# Patient Record
Sex: Female | Born: 1965 | Race: Black or African American | Hispanic: No | Smoking: Never smoker
Health system: Southern US, Community
[De-identification: ages and names within clinical notes are randomized; demographics above are authoritative.]

## PROBLEM LIST (undated history)

## (undated) ENCOUNTER — Ambulatory Visit (HOSPITAL_COMMUNITY): Source: Home / Self Care

## (undated) DIAGNOSIS — Z800 Family history of malignant neoplasm of digestive organs: Secondary | ICD-10-CM

## (undated) DIAGNOSIS — H04559 Acquired stenosis of unspecified nasolacrimal duct: Secondary | ICD-10-CM

## (undated) DIAGNOSIS — C50919 Malignant neoplasm of unspecified site of unspecified female breast: Secondary | ICD-10-CM

## (undated) DIAGNOSIS — Z1371 Encounter for nonprocreative screening for genetic disease carrier status: Secondary | ICD-10-CM

## (undated) DIAGNOSIS — Z1509 Genetic susceptibility to other malignant neoplasm: Secondary | ICD-10-CM

## (undated) DIAGNOSIS — Z8042 Family history of malignant neoplasm of prostate: Secondary | ICD-10-CM

## (undated) DIAGNOSIS — Z9889 Other specified postprocedural states: Secondary | ICD-10-CM

## (undated) DIAGNOSIS — D649 Anemia, unspecified: Secondary | ICD-10-CM

## (undated) DIAGNOSIS — I10 Essential (primary) hypertension: Secondary | ICD-10-CM

## (undated) DIAGNOSIS — I890 Lymphedema, not elsewhere classified: Secondary | ICD-10-CM

## (undated) DIAGNOSIS — G43909 Migraine, unspecified, not intractable, without status migrainosus: Secondary | ICD-10-CM

## (undated) DIAGNOSIS — Z1501 Genetic susceptibility to malignant neoplasm of breast: Secondary | ICD-10-CM

## (undated) HISTORY — DX: Other specified postprocedural states: Z98.890

## (undated) HISTORY — DX: Essential (primary) hypertension: I10

## (undated) HISTORY — DX: Migraine, unspecified, not intractable, without status migrainosus: G43.909

## (undated) HISTORY — DX: Family history of malignant neoplasm of digestive organs: Z80.0

## (undated) HISTORY — DX: Malignant neoplasm of unspecified site of unspecified female breast: C50.919

## (undated) HISTORY — DX: Lymphedema, not elsewhere classified: I89.0

## (undated) HISTORY — DX: Genetic susceptibility to malignant neoplasm of breast: Z15.01

## (undated) HISTORY — DX: Family history of malignant neoplasm of prostate: Z80.42

## (undated) HISTORY — DX: Acquired stenosis of unspecified nasolacrimal duct: H04.559

## (undated) HISTORY — PX: TUBAL LIGATION: SHX77

## (undated) HISTORY — DX: Encounter for nonprocreative screening for genetic disease carrier status: Z13.71

## (undated) HISTORY — PX: OTHER SURGICAL HISTORY: SHX169

## (undated) HISTORY — PX: BREAST BIOPSY: SHX20

## (undated) HISTORY — DX: Genetic susceptibility to other malignant neoplasm: Z15.09

---

## 2000-05-06 ENCOUNTER — Emergency Department (HOSPITAL_COMMUNITY): Admission: EM | Admit: 2000-05-06 | Discharge: 2000-05-06 | Payer: Self-pay | Admitting: *Deleted

## 2000-06-05 ENCOUNTER — Encounter: Payer: Self-pay | Admitting: Unknown Physician Specialty

## 2000-06-05 ENCOUNTER — Ambulatory Visit (HOSPITAL_COMMUNITY): Admission: RE | Admit: 2000-06-05 | Discharge: 2000-06-05 | Payer: Self-pay | Admitting: Unknown Physician Specialty

## 2000-07-04 ENCOUNTER — Ambulatory Visit (HOSPITAL_COMMUNITY): Admission: RE | Admit: 2000-07-04 | Discharge: 2000-07-04 | Payer: Self-pay | Admitting: Urology

## 2000-07-04 ENCOUNTER — Encounter: Payer: Self-pay | Admitting: Urology

## 2000-07-05 ENCOUNTER — Encounter: Payer: Self-pay | Admitting: Urology

## 2000-07-05 ENCOUNTER — Ambulatory Visit (HOSPITAL_COMMUNITY): Admission: RE | Admit: 2000-07-05 | Discharge: 2000-07-05 | Payer: Self-pay | Admitting: Urology

## 2000-07-18 ENCOUNTER — Other Ambulatory Visit: Admission: RE | Admit: 2000-07-18 | Discharge: 2000-07-18 | Payer: Self-pay | Admitting: Obstetrics

## 2000-11-05 HISTORY — PX: BREAST LUMPECTOMY: SHX2

## 2001-07-06 DIAGNOSIS — Z853 Personal history of malignant neoplasm of breast: Secondary | ICD-10-CM | POA: Insufficient documentation

## 2001-07-06 DIAGNOSIS — C50919 Malignant neoplasm of unspecified site of unspecified female breast: Secondary | ICD-10-CM

## 2001-07-06 HISTORY — DX: Malignant neoplasm of unspecified site of unspecified female breast: C50.919

## 2001-07-17 ENCOUNTER — Encounter: Admission: RE | Admit: 2001-07-17 | Discharge: 2001-07-17 | Payer: Self-pay | Admitting: Unknown Physician Specialty

## 2001-07-17 ENCOUNTER — Encounter: Payer: Self-pay | Admitting: Unknown Physician Specialty

## 2001-07-23 ENCOUNTER — Ambulatory Visit (HOSPITAL_BASED_OUTPATIENT_CLINIC_OR_DEPARTMENT_OTHER): Admission: RE | Admit: 2001-07-23 | Discharge: 2001-07-23 | Payer: Self-pay | Admitting: Surgery

## 2001-07-23 ENCOUNTER — Encounter (INDEPENDENT_AMBULATORY_CARE_PROVIDER_SITE_OTHER): Payer: Self-pay | Admitting: *Deleted

## 2001-07-23 ENCOUNTER — Encounter: Payer: Self-pay | Admitting: Surgery

## 2001-07-23 HISTORY — PX: MASTECTOMY, PARTIAL: SHX709

## 2001-07-29 ENCOUNTER — Encounter: Payer: Self-pay | Admitting: Surgery

## 2001-07-29 ENCOUNTER — Encounter: Admission: RE | Admit: 2001-07-29 | Discharge: 2001-07-29 | Payer: Self-pay | Admitting: Surgery

## 2001-07-31 ENCOUNTER — Encounter: Admission: RE | Admit: 2001-07-31 | Discharge: 2001-07-31 | Payer: Self-pay | Admitting: Surgery

## 2001-07-31 ENCOUNTER — Encounter: Payer: Self-pay | Admitting: Surgery

## 2001-11-03 ENCOUNTER — Ambulatory Visit (HOSPITAL_COMMUNITY): Admission: RE | Admit: 2001-11-03 | Discharge: 2001-11-03 | Payer: Self-pay | Admitting: Hematology and Oncology

## 2001-11-03 ENCOUNTER — Encounter: Payer: Self-pay | Admitting: Hematology and Oncology

## 2001-11-12 ENCOUNTER — Encounter: Payer: Self-pay | Admitting: Surgery

## 2001-11-12 ENCOUNTER — Ambulatory Visit (HOSPITAL_BASED_OUTPATIENT_CLINIC_OR_DEPARTMENT_OTHER): Admission: RE | Admit: 2001-11-12 | Discharge: 2001-11-12 | Payer: Self-pay | Admitting: Surgery

## 2001-11-13 ENCOUNTER — Ambulatory Visit: Admission: RE | Admit: 2001-11-13 | Discharge: 2002-02-11 | Payer: Self-pay | Admitting: Radiation Oncology

## 2002-01-10 ENCOUNTER — Emergency Department (HOSPITAL_COMMUNITY): Admission: EM | Admit: 2002-01-10 | Discharge: 2002-01-11 | Payer: Self-pay | Admitting: Emergency Medicine

## 2002-02-05 ENCOUNTER — Encounter: Admission: RE | Admit: 2002-02-05 | Discharge: 2002-02-05 | Payer: Self-pay | Admitting: Radiation Oncology

## 2002-02-13 ENCOUNTER — Ambulatory Visit: Admission: RE | Admit: 2002-02-13 | Discharge: 2002-05-14 | Payer: Self-pay | Admitting: Radiation Oncology

## 2002-02-25 ENCOUNTER — Ambulatory Visit: Admission: RE | Admit: 2002-02-25 | Discharge: 2002-02-25 | Payer: Self-pay | Admitting: Hematology and Oncology

## 2002-03-10 ENCOUNTER — Observation Stay (HOSPITAL_COMMUNITY): Admission: EM | Admit: 2002-03-10 | Discharge: 2002-03-11 | Payer: Self-pay | Admitting: Emergency Medicine

## 2002-03-25 ENCOUNTER — Encounter: Admission: RE | Admit: 2002-03-25 | Discharge: 2002-06-23 | Payer: Self-pay | Admitting: Radiation Oncology

## 2002-04-03 ENCOUNTER — Ambulatory Visit (HOSPITAL_COMMUNITY): Admission: RE | Admit: 2002-04-03 | Discharge: 2002-04-03 | Payer: Self-pay | Admitting: Hematology and Oncology

## 2002-04-03 ENCOUNTER — Encounter: Payer: Self-pay | Admitting: Hematology and Oncology

## 2002-04-30 ENCOUNTER — Ambulatory Visit (HOSPITAL_BASED_OUTPATIENT_CLINIC_OR_DEPARTMENT_OTHER): Admission: RE | Admit: 2002-04-30 | Discharge: 2002-04-30 | Payer: Self-pay | Admitting: Surgery

## 2002-07-20 ENCOUNTER — Encounter: Admission: RE | Admit: 2002-07-20 | Discharge: 2002-07-20 | Payer: Self-pay | Admitting: Hematology and Oncology

## 2002-07-20 ENCOUNTER — Encounter: Payer: Self-pay | Admitting: Hematology and Oncology

## 2002-07-31 ENCOUNTER — Encounter: Payer: Self-pay | Admitting: Hematology and Oncology

## 2002-07-31 ENCOUNTER — Ambulatory Visit (HOSPITAL_COMMUNITY): Admission: RE | Admit: 2002-07-31 | Discharge: 2002-07-31 | Payer: Self-pay | Admitting: Hematology and Oncology

## 2002-11-13 ENCOUNTER — Ambulatory Visit (HOSPITAL_COMMUNITY): Admission: RE | Admit: 2002-11-13 | Discharge: 2002-11-13 | Payer: Self-pay | Admitting: Hematology & Oncology

## 2002-11-13 ENCOUNTER — Encounter: Payer: Self-pay | Admitting: Hematology & Oncology

## 2002-12-22 ENCOUNTER — Ambulatory Visit (HOSPITAL_COMMUNITY): Admission: RE | Admit: 2002-12-22 | Discharge: 2002-12-22 | Payer: Self-pay | Admitting: Unknown Physician Specialty

## 2002-12-22 ENCOUNTER — Encounter: Payer: Self-pay | Admitting: Unknown Physician Specialty

## 2003-02-26 ENCOUNTER — Encounter: Payer: Self-pay | Admitting: Hematology & Oncology

## 2003-02-26 ENCOUNTER — Ambulatory Visit (HOSPITAL_COMMUNITY): Admission: RE | Admit: 2003-02-26 | Discharge: 2003-02-26 | Payer: Self-pay | Admitting: Hematology & Oncology

## 2003-03-30 ENCOUNTER — Encounter (INDEPENDENT_AMBULATORY_CARE_PROVIDER_SITE_OTHER): Payer: Self-pay | Admitting: Cardiology

## 2003-03-30 ENCOUNTER — Ambulatory Visit: Admission: RE | Admit: 2003-03-30 | Discharge: 2003-03-30 | Payer: Self-pay | Admitting: Oncology

## 2003-08-23 ENCOUNTER — Encounter: Admission: RE | Admit: 2003-08-23 | Discharge: 2003-08-23 | Payer: Self-pay | Admitting: Oncology

## 2003-08-23 ENCOUNTER — Encounter: Payer: Self-pay | Admitting: Oncology

## 2003-09-18 ENCOUNTER — Emergency Department (HOSPITAL_COMMUNITY): Admission: EM | Admit: 2003-09-18 | Discharge: 2003-09-18 | Payer: Self-pay | Admitting: Emergency Medicine

## 2003-12-27 ENCOUNTER — Other Ambulatory Visit: Admission: RE | Admit: 2003-12-27 | Discharge: 2003-12-27 | Payer: Self-pay | Admitting: Obstetrics and Gynecology

## 2004-02-07 ENCOUNTER — Emergency Department (HOSPITAL_COMMUNITY): Admission: EM | Admit: 2004-02-07 | Discharge: 2004-02-07 | Payer: Self-pay | Admitting: Emergency Medicine

## 2004-05-16 ENCOUNTER — Other Ambulatory Visit: Admission: RE | Admit: 2004-05-16 | Discharge: 2004-05-16 | Payer: Self-pay | Admitting: Obstetrics and Gynecology

## 2004-07-05 ENCOUNTER — Ambulatory Visit (HOSPITAL_COMMUNITY): Admission: RE | Admit: 2004-07-05 | Discharge: 2004-07-05 | Payer: Self-pay | Admitting: Family Medicine

## 2004-08-23 ENCOUNTER — Encounter: Admission: RE | Admit: 2004-08-23 | Discharge: 2004-08-23 | Payer: Self-pay | Admitting: Oncology

## 2004-09-07 ENCOUNTER — Ambulatory Visit (HOSPITAL_COMMUNITY): Admission: RE | Admit: 2004-09-07 | Discharge: 2004-09-07 | Payer: Self-pay | Admitting: Family Medicine

## 2004-09-12 ENCOUNTER — Ambulatory Visit: Payer: Self-pay | Admitting: Oncology

## 2004-11-07 ENCOUNTER — Ambulatory Visit (HOSPITAL_COMMUNITY): Admission: RE | Admit: 2004-11-07 | Discharge: 2004-11-07 | Payer: Self-pay | Admitting: Oncology

## 2004-12-12 ENCOUNTER — Ambulatory Visit: Payer: Self-pay | Admitting: Oncology

## 2005-01-10 ENCOUNTER — Other Ambulatory Visit: Admission: RE | Admit: 2005-01-10 | Discharge: 2005-01-10 | Payer: Self-pay | Admitting: Obstetrics and Gynecology

## 2005-04-03 ENCOUNTER — Encounter: Admission: RE | Admit: 2005-04-03 | Discharge: 2005-04-03 | Payer: Self-pay | Admitting: Oncology

## 2005-04-16 ENCOUNTER — Ambulatory Visit: Payer: Self-pay | Admitting: Oncology

## 2005-04-16 ENCOUNTER — Encounter: Admission: RE | Admit: 2005-04-16 | Discharge: 2005-04-16 | Payer: Self-pay | Admitting: Oncology

## 2005-08-14 ENCOUNTER — Ambulatory Visit: Payer: Self-pay | Admitting: Oncology

## 2005-10-08 ENCOUNTER — Ambulatory Visit (HOSPITAL_COMMUNITY): Admission: RE | Admit: 2005-10-08 | Discharge: 2005-10-08 | Payer: Self-pay | Admitting: Family Medicine

## 2005-10-10 ENCOUNTER — Ambulatory Visit: Payer: Self-pay | Admitting: Oncology

## 2006-01-24 ENCOUNTER — Other Ambulatory Visit: Admission: RE | Admit: 2006-01-24 | Discharge: 2006-01-24 | Payer: Self-pay | Admitting: Obstetrics & Gynecology

## 2006-06-07 ENCOUNTER — Other Ambulatory Visit: Admission: RE | Admit: 2006-06-07 | Discharge: 2006-06-07 | Payer: Self-pay | Admitting: Gynecology

## 2006-07-01 ENCOUNTER — Ambulatory Visit: Payer: Self-pay | Admitting: Oncology

## 2006-07-03 LAB — CBC WITH DIFFERENTIAL/PLATELET
Eosinophils Absolute: 0.1 10*3/uL (ref 0.0–0.5)
HCT: 33.7 % — ABNORMAL LOW (ref 34.8–46.6)
LYMPH%: 20.1 % (ref 14.0–48.0)
MCV: 88 fL (ref 81.0–101.0)
MONO%: 8.8 % (ref 0.0–13.0)
NEUT#: 4.2 10*3/uL (ref 1.5–6.5)
NEUT%: 70 % (ref 39.6–76.8)
Platelets: 324 10*3/uL (ref 145–400)
RBC: 3.83 10*6/uL (ref 3.70–5.32)

## 2006-07-03 LAB — COMPREHENSIVE METABOLIC PANEL
Alkaline Phosphatase: 49 U/L (ref 39–117)
BUN: 10 mg/dL (ref 6–23)
Creatinine, Ser: 0.88 mg/dL (ref 0.40–1.20)
Glucose, Bld: 86 mg/dL (ref 70–99)
Sodium: 138 mEq/L (ref 135–145)
Total Bilirubin: 0.5 mg/dL (ref 0.3–1.2)
Total Protein: 7 g/dL (ref 6.0–8.3)

## 2006-07-11 ENCOUNTER — Encounter: Admission: RE | Admit: 2006-07-11 | Discharge: 2006-07-11 | Payer: Self-pay | Admitting: Oncology

## 2006-08-24 ENCOUNTER — Emergency Department (HOSPITAL_COMMUNITY): Admission: EM | Admit: 2006-08-24 | Discharge: 2006-08-24 | Payer: Self-pay | Admitting: Emergency Medicine

## 2006-09-12 ENCOUNTER — Encounter
Admission: RE | Admit: 2006-09-12 | Discharge: 2006-12-11 | Payer: Self-pay | Admitting: Physical Medicine and Rehabilitation

## 2006-10-31 ENCOUNTER — Ambulatory Visit (HOSPITAL_COMMUNITY): Admission: RE | Admit: 2006-10-31 | Discharge: 2006-10-31 | Payer: Self-pay | Admitting: Family Medicine

## 2006-11-05 HISTORY — PX: AUGMENTATION MAMMAPLASTY: SUR837

## 2006-12-30 ENCOUNTER — Ambulatory Visit: Payer: Self-pay | Admitting: Oncology

## 2007-01-01 LAB — COMPREHENSIVE METABOLIC PANEL
Alkaline Phosphatase: 56 U/L (ref 39–117)
BUN: 12 mg/dL (ref 6–23)
CO2: 25 mEq/L (ref 19–32)
Glucose, Bld: 91 mg/dL (ref 70–99)
Total Bilirubin: 0.4 mg/dL (ref 0.3–1.2)

## 2007-01-01 LAB — CBC WITH DIFFERENTIAL/PLATELET
Basophils Absolute: 0 10*3/uL (ref 0.0–0.1)
Eosinophils Absolute: 0 10*3/uL (ref 0.0–0.5)
HCT: 35.6 % (ref 34.8–46.6)
HGB: 12.1 g/dL (ref 11.6–15.9)
LYMPH%: 18.3 % (ref 14.0–48.0)
MCV: 85.2 fL (ref 81.0–101.0)
MONO#: 0.4 10*3/uL (ref 0.1–0.9)
MONO%: 6.1 % (ref 0.0–13.0)
NEUT#: 4.8 10*3/uL (ref 1.5–6.5)
NEUT%: 74.8 % (ref 39.6–76.8)
Platelets: 333 10*3/uL (ref 145–400)
WBC: 6.4 10*3/uL (ref 3.9–10.0)

## 2007-04-21 ENCOUNTER — Ambulatory Visit: Payer: Self-pay | Admitting: Oncology

## 2007-07-14 ENCOUNTER — Encounter: Admission: RE | Admit: 2007-07-14 | Discharge: 2007-07-14 | Payer: Self-pay | Admitting: Oncology

## 2007-07-28 ENCOUNTER — Ambulatory Visit: Payer: Self-pay | Admitting: Oncology

## 2007-07-30 LAB — COMPREHENSIVE METABOLIC PANEL
ALT: 10 U/L (ref 0–35)
AST: 19 U/L (ref 0–37)
Albumin: 4.1 g/dL (ref 3.5–5.2)
BUN: 16 mg/dL (ref 6–23)
CO2: 26 mEq/L (ref 19–32)
Calcium: 8.9 mg/dL (ref 8.4–10.5)
Chloride: 104 mEq/L (ref 96–112)
Potassium: 3.4 mEq/L — ABNORMAL LOW (ref 3.5–5.3)

## 2007-07-30 LAB — CBC WITH DIFFERENTIAL/PLATELET
BASO%: 0.1 % (ref 0.0–2.0)
Basophils Absolute: 0 10*3/uL (ref 0.0–0.1)
EOS%: 0.6 % (ref 0.0–7.0)
HCT: 36.6 % (ref 34.8–46.6)
HGB: 12.6 g/dL (ref 11.6–15.9)
MCH: 30.1 pg (ref 26.0–34.0)
MONO#: 0.5 10*3/uL (ref 0.1–0.9)
RDW: 15.3 % — ABNORMAL HIGH (ref 11.3–14.5)
WBC: 7.3 10*3/uL (ref 3.9–10.0)
lymph#: 0.9 10*3/uL (ref 0.9–3.3)

## 2007-09-03 ENCOUNTER — Other Ambulatory Visit: Admission: RE | Admit: 2007-09-03 | Discharge: 2007-09-03 | Payer: Self-pay | Admitting: Family Medicine

## 2007-10-25 ENCOUNTER — Encounter: Admission: RE | Admit: 2007-10-25 | Discharge: 2007-10-25 | Payer: Self-pay | Admitting: Neurology

## 2007-12-10 ENCOUNTER — Ambulatory Visit (HOSPITAL_BASED_OUTPATIENT_CLINIC_OR_DEPARTMENT_OTHER): Admission: RE | Admit: 2007-12-10 | Discharge: 2007-12-10 | Payer: Self-pay | Admitting: Gynecology

## 2007-12-10 ENCOUNTER — Encounter: Payer: Self-pay | Admitting: Gynecology

## 2007-12-10 HISTORY — PX: HYSTEROSCOPY: SHX211

## 2008-02-09 ENCOUNTER — Ambulatory Visit: Payer: Self-pay | Admitting: Oncology

## 2008-02-11 LAB — CBC WITH DIFFERENTIAL/PLATELET
Basophils Absolute: 0 10*3/uL (ref 0.0–0.1)
EOS%: 1 % (ref 0.0–7.0)
Eosinophils Absolute: 0.1 10*3/uL (ref 0.0–0.5)
HCT: 35.4 % (ref 34.8–46.6)
HGB: 11.4 g/dL — ABNORMAL LOW (ref 11.6–15.9)
MONO#: 0.5 10*3/uL (ref 0.1–0.9)
NEUT#: 4.5 10*3/uL (ref 1.5–6.5)
NEUT%: 70.4 % (ref 39.6–76.8)
RDW: 14.9 % — ABNORMAL HIGH (ref 11.3–14.5)
WBC: 6.4 10*3/uL (ref 3.9–10.0)
lymph#: 1.3 10*3/uL (ref 0.9–3.3)

## 2008-02-11 LAB — COMPREHENSIVE METABOLIC PANEL
ALT: <8 U/L (ref 0–35)
Alkaline Phosphatase: 53 U/L (ref 39–117)
Chloride: 105 mEq/L (ref 96–112)
Potassium: 3.8 mEq/L (ref 3.5–5.3)
Total Bilirubin: 0.5 mg/dL (ref 0.3–1.2)
Total Protein: 7.3 g/dL (ref 6.0–8.3)

## 2008-07-15 ENCOUNTER — Encounter: Admission: RE | Admit: 2008-07-15 | Discharge: 2008-07-15 | Payer: Self-pay | Admitting: Oncology

## 2008-07-22 ENCOUNTER — Ambulatory Visit: Payer: Self-pay | Admitting: Oncology

## 2008-07-22 LAB — COMPREHENSIVE METABOLIC PANEL
AST: 16 U/L (ref 0–37)
Albumin: 4 g/dL (ref 3.5–5.2)
BUN: 11 mg/dL (ref 6–23)
Calcium: 9 mg/dL (ref 8.4–10.5)
Chloride: 103 mEq/L (ref 96–112)
Creatinine, Ser: 0.84 mg/dL (ref 0.40–1.20)
Glucose, Bld: 119 mg/dL — ABNORMAL HIGH (ref 70–99)
Potassium: 3.6 mEq/L (ref 3.5–5.3)

## 2008-07-22 LAB — CBC WITH DIFFERENTIAL/PLATELET
Basophils Absolute: 0.2 10*3/uL — ABNORMAL HIGH (ref 0.0–0.1)
EOS%: 0.9 % (ref 0.0–7.0)
Eosinophils Absolute: 0.1 10*3/uL (ref 0.0–0.5)
HCT: 37 % (ref 34.8–46.6)
HGB: 12.4 g/dL (ref 11.6–15.9)
MCH: 29.6 pg (ref 26.0–34.0)
MCV: 88.7 fL (ref 81.0–101.0)
NEUT#: 5.7 10*3/uL (ref 1.5–6.5)
NEUT%: 71.1 % (ref 39.6–76.8)
RDW: 16.3 % — ABNORMAL HIGH (ref 11.3–14.5)
lymph#: 1.6 10*3/uL (ref 0.9–3.3)

## 2008-07-28 ENCOUNTER — Ambulatory Visit: Payer: Self-pay | Admitting: Women's Health

## 2008-07-30 ENCOUNTER — Ambulatory Visit: Payer: Self-pay | Admitting: Women's Health

## 2008-08-04 ENCOUNTER — Encounter: Admission: RE | Admit: 2008-08-04 | Discharge: 2008-08-04 | Payer: Self-pay | Admitting: Oncology

## 2008-11-24 ENCOUNTER — Other Ambulatory Visit: Admission: RE | Admit: 2008-11-24 | Discharge: 2008-11-24 | Payer: Self-pay | Admitting: Gynecology

## 2008-11-24 ENCOUNTER — Ambulatory Visit: Payer: Self-pay | Admitting: Women's Health

## 2008-11-24 ENCOUNTER — Encounter: Payer: Self-pay | Admitting: Women's Health

## 2008-12-03 ENCOUNTER — Ambulatory Visit: Payer: Self-pay | Admitting: Oncology

## 2008-12-20 ENCOUNTER — Ambulatory Visit: Payer: Self-pay | Admitting: Women's Health

## 2009-02-24 ENCOUNTER — Ambulatory Visit: Payer: Self-pay | Admitting: Oncology

## 2009-02-28 LAB — CBC WITH DIFFERENTIAL/PLATELET
Basophils Absolute: 0 10*3/uL (ref 0.0–0.1)
EOS%: 1.1 % (ref 0.0–7.0)
Eosinophils Absolute: 0.1 10*3/uL (ref 0.0–0.5)
HCT: 36.8 % (ref 34.8–46.6)
HGB: 12.4 g/dL (ref 11.6–15.9)
MCH: 29.9 pg (ref 25.1–34.0)
MCV: 88.8 fL (ref 79.5–101.0)
MONO%: 8.8 % (ref 0.0–14.0)
NEUT%: 69.1 % (ref 38.4–76.8)
Platelets: 285 10*3/uL (ref 145–400)

## 2009-02-28 LAB — COMPREHENSIVE METABOLIC PANEL
AST: 14 U/L (ref 0–37)
Alkaline Phosphatase: 52 U/L (ref 39–117)
BUN: 9 mg/dL (ref 6–23)
Calcium: 9 mg/dL (ref 8.4–10.5)
Creatinine, Ser: 0.86 mg/dL (ref 0.40–1.20)
Glucose, Bld: 79 mg/dL (ref 70–99)

## 2009-03-07 ENCOUNTER — Ambulatory Visit: Payer: Self-pay | Admitting: Genetic Counselor

## 2009-06-03 ENCOUNTER — Ambulatory Visit: Payer: Self-pay | Admitting: Women's Health

## 2009-07-18 ENCOUNTER — Encounter: Admission: RE | Admit: 2009-07-18 | Discharge: 2009-07-18 | Payer: Self-pay | Admitting: Oncology

## 2009-07-25 ENCOUNTER — Encounter: Admission: RE | Admit: 2009-07-25 | Discharge: 2009-07-25 | Payer: Self-pay | Admitting: Oncology

## 2009-07-27 ENCOUNTER — Encounter: Admission: RE | Admit: 2009-07-27 | Discharge: 2009-07-27 | Payer: Self-pay | Admitting: Oncology

## 2009-08-05 DIAGNOSIS — Z1501 Genetic susceptibility to malignant neoplasm of breast: Secondary | ICD-10-CM

## 2009-08-05 HISTORY — DX: Genetic susceptibility to malignant neoplasm of breast: Z15.01

## 2009-08-29 ENCOUNTER — Ambulatory Visit: Payer: Self-pay | Admitting: Women's Health

## 2009-10-24 ENCOUNTER — Ambulatory Visit: Payer: Self-pay | Admitting: Genetic Counselor

## 2009-10-26 ENCOUNTER — Ambulatory Visit: Payer: Self-pay | Admitting: Oncology

## 2009-10-26 LAB — CBC WITH DIFFERENTIAL/PLATELET
Basophils Absolute: 0 10*3/uL (ref 0.0–0.1)
EOS%: 5.9 % (ref 0.0–7.0)
HGB: 12.2 g/dL (ref 11.6–15.9)
LYMPH%: 27 % (ref 14.0–49.7)
MCH: 28.9 pg (ref 25.1–34.0)
MCV: 87.7 fL (ref 79.5–101.0)
MONO%: 6.7 % (ref 0.0–14.0)
RDW: 14.1 % (ref 11.2–14.5)

## 2009-10-26 LAB — COMPREHENSIVE METABOLIC PANEL
AST: 18 U/L (ref 0–37)
Albumin: 4.2 g/dL (ref 3.5–5.2)
Alkaline Phosphatase: 51 U/L (ref 39–117)
BUN: 15 mg/dL (ref 6–23)
Creatinine, Ser: 0.9 mg/dL (ref 0.40–1.20)
Potassium: 4 mEq/L (ref 3.5–5.3)
Total Bilirubin: 0.4 mg/dL (ref 0.3–1.2)

## 2009-10-31 ENCOUNTER — Encounter: Admission: RE | Admit: 2009-10-31 | Discharge: 2009-10-31 | Payer: Self-pay | Admitting: Oncology

## 2009-11-18 ENCOUNTER — Encounter: Admission: RE | Admit: 2009-11-18 | Discharge: 2009-11-18 | Payer: Self-pay | Admitting: Oncology

## 2009-11-28 ENCOUNTER — Other Ambulatory Visit: Admission: RE | Admit: 2009-11-28 | Discharge: 2009-11-28 | Payer: Self-pay | Admitting: Gynecology

## 2009-11-28 ENCOUNTER — Ambulatory Visit: Payer: Self-pay | Admitting: Women's Health

## 2010-04-26 ENCOUNTER — Ambulatory Visit: Payer: Self-pay | Admitting: Oncology

## 2010-04-28 LAB — COMPREHENSIVE METABOLIC PANEL
AST: 14 U/L (ref 0–37)
Albumin: 4 g/dL (ref 3.5–5.2)
Alkaline Phosphatase: 53 U/L (ref 39–117)
Glucose, Bld: 72 mg/dL (ref 70–99)
Potassium: 3.8 mEq/L (ref 3.5–5.3)
Sodium: 140 mEq/L (ref 135–145)
Total Protein: 7.2 g/dL (ref 6.0–8.3)

## 2010-04-28 LAB — CBC WITH DIFFERENTIAL/PLATELET
BASO%: 0.1 % (ref 0.0–2.0)
Basophils Absolute: 0 10*3/uL (ref 0.0–0.1)
EOS%: 0.6 % (ref 0.0–7.0)
HGB: 11.8 g/dL (ref 11.6–15.9)
MCH: 28.4 pg (ref 25.1–34.0)
RDW: 15.2 % — ABNORMAL HIGH (ref 11.2–14.5)
WBC: 6.7 10*3/uL (ref 3.9–10.3)
lymph#: 1.5 10*3/uL (ref 0.9–3.3)

## 2010-05-01 ENCOUNTER — Ambulatory Visit: Payer: Self-pay | Admitting: Women's Health

## 2010-05-23 ENCOUNTER — Encounter: Admission: RE | Admit: 2010-05-23 | Discharge: 2010-07-20 | Payer: Self-pay | Admitting: Oncology

## 2010-09-05 ENCOUNTER — Encounter: Admission: RE | Admit: 2010-09-05 | Discharge: 2010-09-05 | Payer: Self-pay | Admitting: Oncology

## 2010-09-20 ENCOUNTER — Ambulatory Visit: Payer: Self-pay | Admitting: Oncology

## 2010-09-22 LAB — CBC WITH DIFFERENTIAL/PLATELET
EOS%: 1.8 % (ref 0.0–7.0)
MCH: 29.9 pg (ref 25.1–34.0)
MCV: 87.9 fL (ref 79.5–101.0)
MONO%: 8.9 % (ref 0.0–14.0)
RBC: 3.79 10*6/uL (ref 3.70–5.45)
RDW: 14.6 % — ABNORMAL HIGH (ref 11.2–14.5)

## 2010-09-22 LAB — COMPREHENSIVE METABOLIC PANEL
AST: 19 U/L (ref 0–37)
Albumin: 4 g/dL (ref 3.5–5.2)
Alkaline Phosphatase: 53 U/L (ref 39–117)
BUN: 14 mg/dL (ref 6–23)
Potassium: 3.4 mEq/L — ABNORMAL LOW (ref 3.5–5.3)
Sodium: 140 mEq/L (ref 135–145)
Total Protein: 7 g/dL (ref 6.0–8.3)

## 2010-10-20 ENCOUNTER — Other Ambulatory Visit
Admission: RE | Admit: 2010-10-20 | Discharge: 2010-10-20 | Payer: Self-pay | Source: Home / Self Care | Admitting: Gynecology

## 2010-10-20 ENCOUNTER — Ambulatory Visit: Payer: Self-pay | Admitting: Women's Health

## 2010-11-25 ENCOUNTER — Encounter: Payer: Self-pay | Admitting: Family Medicine

## 2010-11-25 ENCOUNTER — Encounter: Payer: Self-pay | Admitting: Oncology

## 2010-11-26 ENCOUNTER — Encounter: Payer: Self-pay | Admitting: Oncology

## 2011-03-20 NOTE — Op Note (Signed)
NAMEJOCLYNN, Tara Bell           ACCOUNT NO.:  [id number]   MEDICAL RECORD NO.:  [id number]          PATIENT TYPE:  AMB   LOCATION:  [facility]                         FACILITY:  [facility]   PHYSICIAN:  Juan H. Lily Peer, M.D.DATE OF BIRTH:  June 07, 1966   DATE OF PROCEDURE:  DATE OF DISCHARGE:                               OPERATIVE REPORT   INDICATION FOR OPERATION:  A 45 year old gravida 2, para 2, with  menometrorrhagia, had undergone a sonohysterogram. which demonstrated  intracavitary defect at the anterior uterine wall, suspect submucous  myoma or endometrial polyp.   PREOPERATIVE DIAGNOSES:  1. Menometrorrhagia.  2. Submucous myoma.   POSTOPERATIVE DIAGNOSES:  1. Menometrorrhagia.  2. Submucous myoma.   ANESTHESIA:  General endotracheal anesthesia.   PROCEDURE PERFORMED:  Resectoscopic polypectomy and resectoscopic  myomectomy and endometrial curettage.   FINDINGS:  Submucous myoma coming of the right uterine side wall and  large endometrial polyp from the anterior uterine wall.  Both tubes and  ostia were identified and the cervical canal was clear.   DESCRIPTION OF OPERATION:  After the patient was adequately counseled,  she was taken to the operating room where she underwent a successful  general endotracheal anesthesia.  The patient had received 1 g of  cefoxitin prophylactically.  She was placed on low lithotomy position.  The previously placed Laminaria was removed from the cervix, and the  cervix and vagina were cleansed with Betadine solution.  Red rubber  Roxan Hockey was utilized to evacuate the bladder of  its contents,  approximately 50 mL.  A bimanual examination demonstrated anteverted  uterus, normal in size, shape, and consistency with no palpable adnexal  masses.  A single-tooth tenaculum was then placed on the anterior  cervical lip.  The uterus sounded to approximately 8 cm, required no  dilatation as a result of the Laminaria.  The Lendell Caprice operative  resectoscope with 9-degree wire loop was introduced into the  intrauterine cavity.  A 3% sorbitol was used as a staining media.  The  [place] electrosurgical generator was set on 70 watts on the cutting  and 70 watts on the coagulation.  Systematic inspection of the cervical  canal and the uterus was undertaken.  She was found to have a small  submucous myoma on the right uterine side wall and a polypoid lesion  coming off the anterior fundal region of the uterus.  These areas were  resected and passed off the operative field for histologic evaluation.  Following this, a vigorous curettage was performed and submitted as  separate specimen for pathologic evaluation as well.  Inspection of the  intrauterine cavity demonstrated some isolated bleeders, which were  cauterized.  The single tenaculum was removed.  Silver nitrate was  applied to the area with anterior cervical lip.  It had caused some  bleeding from the prong from [place] tenaculum that was contained.  The  patient was awakened and  transferred to the recovery with stable vital signs.  Blood loss was  minimal for resuscitation and consisted of 900 mL of Lactated Ringer's  and fluid deficit of 3% sorbitol was 50 mL.  The patient  was to receive  30 mg of Toradol IV en route to the recovery room.      Juan H. Lily Peer, M.D.  Electronically Signed     JHF/MEDQ  D:  12/10/2007  T:  12/11/2007  Job:  [id number]

## 2011-03-20 NOTE — H&P (Signed)
Tara Bell, Tara Bell           ACCOUNT NO.:  [id number]   MEDICAL RECORD NO.:  [id number]        PATIENT TYPE:  HAMB   LOCATION:                               FACILITY:  [facility]   PHYSICIAN:  Juan H. Lily Peer, M.D.DATE OF BIRTH:  12-May-1966   DATE OF ADMISSION:  12/10/2007  DATE OF DISCHARGE:                              HISTORY & PHYSICAL   HISTORY AND PHYSICAL:  She is scheduled for surgery tomorrow, Wednesday,  12/09/06, at at 1:00 p.m.   CHIEF COMPLAINT:  Menometrorrhagia.   HISTORY OF PRESENT ILLNESS:  This is a 45 year old gravida 2, para 2,  who has been evaluated for menometrorrhagia.  It was very difficult to  proceed with an endometrial biopsy in the office due to the fact that  she was very uncomfortable.  A sonohystogram had demonstrated that she  had a submucous myoma measuring 10 mm in the anterior uterine wall.  Prior ultrasound was done in the office on November 14, 2007, that  demonstrates she has multiple small  intramural myomas.  The patient was  seen in the office today for preoperative consultation as well as  replacement of laminaria intercervically.  She is currently on Megace 40  mg b.i.d..   PAST MEDICAL HISTORY:  The patient is allergic to Macrobid.  She had a  history of stage IIB left breast cancer with 10 positive nodes back in  2002, had a left lumpectomy,  chemo and radiation therapy and finished  her treatment in 2003.  She has a known history of intramural fibroids.  She is on 2 hypertensive medications, she only remembers the name of one  is Diovan.  She is currently taking Megace 40 mg b.i.d. to stop her  bleeding.  She has had bilateral tubal ligation in the past.   FAMILY HISTORY:  Significant for hypertension in her mother and her  father.   PHYSICAL EXAMINATION:  VITAL SIGNS:  Weight 132 pounds, she is 5 feet 1-  1/2 inches tall, and blood pressure 130/98.  HEENT:  Unremarkable.  NECK:  Supple.  TRACHEA:  Midline.  No carotid bruits.   No thyromegaly.  LUNGS:  Clear to auscultation without any rhonchi or wheezes.  HEART:  Regular rate rhythm.  No murmurs or gallops.  BREAST EXAM:  Not done.  ABDOMEN:  Soft and nontender.  No rebound or guarding.  PELVIC: Bartholin's, urethral and Skene's glands within normal limits.  SKIN:  Within normal limits.  VAGINA AND CERVIX:  No lesion or discharge.  UTERUS:  Slightly irregular but upper limits of normal , anteverted.  No  palpable adnexal masses.  RECTAL EXAM:  Deferred.   ASSESSMENT:  A 45 year old gravida 2, para 2, with menometrorrhagia  unable to do an endometrial biopsy in the office due to discomfort.  Sonohystogram had demonstrated an anterior uterine wall submucous myoma  measuring 10 mm in size.  Normal appearing ovaries.  She underwent  placement of laminaria intercervically today in the office as part of  her  preoperative evaluation.  The risks, benefits and pros and cons of  hysteroscopic myomectomy and dilatation and curettage were discussed.  All questions were answered and will follow accordingly.   PLAN:  As outlined  above.  Surgery  at [facility]  planned for tomorrow Wednesday December 10, 2007, at 1:00 p.m.      Juan H. Lily Peer, M.D.  Electronically Signed     JHF/MEDQ  D:  12/09/2007  T:  12/09/2007  Job:  [id number]

## 2011-03-23 ENCOUNTER — Encounter (HOSPITAL_BASED_OUTPATIENT_CLINIC_OR_DEPARTMENT_OTHER): Payer: 59 | Admitting: Oncology

## 2011-03-23 ENCOUNTER — Other Ambulatory Visit: Payer: Self-pay | Admitting: Oncology

## 2011-03-23 DIAGNOSIS — C50419 Malignant neoplasm of upper-outer quadrant of unspecified female breast: Secondary | ICD-10-CM

## 2011-03-23 DIAGNOSIS — Z1231 Encounter for screening mammogram for malignant neoplasm of breast: Secondary | ICD-10-CM

## 2011-03-23 NOTE — Op Note (Signed)
[place]. [facility]  Patient:    Tara Bell, Tara Bell Visit Number: [id number] MRN: [id number]          Service Type: DSU Location: [facility] Attending Physician:  Charlton Haws Dictated by:   Currie Paris, M.D. Proc. Date: 11/12/01 Admit Date:  11/12/2001   CC:         Lowell C. Catha Gosselin, M.D.   Operative Report  CCS# [id number]  PREOPERATIVE DIAGNOSIS:  Carcinoma of the breast, inadequate intravenous access for chemotherapy.  POSTOPERATIVE DIAGNOSIS:  Carcinoma of the breast, inadequate intravenous access for chemotherapy.  OPERATION PERFORMED:  Insertion of Port-A-Cath.  SURGEON:  Currie Paris, M.D.  ANESTHESIA:  MAC.  INDICATIONS FOR PROCEDURE:  The patient is a 45 year old woman undergoing chemotherapy for breast cancer and has had several treatments but now has inadequate venous access to continue.  She was scheduled therefore for Port-A-Cath with plans for chemotherapy later today.  DESCRIPTION OF PROCEDURE:  The patient was seen in the holding area and had no further questions.  She was taken to the operating room and given intravenous sedation.  The upper chest and lower neck was prepped and draped as a sterile field.  The patient was placed in Trendelenburg position.  1% Xylocaine was infiltrated in the right infraclavicular fossa and on the initial attempt the subclavian vein was entered and the guide wire threaded and followed into the superior vena cava using fluoroscopy.  The area where the Port-A-Cath reservoir was going to go was infiltrated with some Xylocaine and a transverse incision made and a pocket fashioned with cautery.  Local was infiltrated from that spot to the guide wire spot.  The guide wire site was enlarged with a scalpel and then a tendon puller used to pull a catheter through.  The catheter was flushed.  The guide wire site was dilated once with a 10 dilator followed by the 10 dilator and peel-away  sheath.  However, when I tried to thread the catheter through, it seemed to hand up and using fluoroscopy I could see there was a kink in the peel-away sheath at about the junction of the first rib and clavicle or perhaps a little further in.  I could manipulate the catheter past this.  I then pulled the catheter out of the reservoir site so that I simply had a straight line into it and threaded the guide wire into the catheter after shortening it a little bit.  I used the flexible end of the guide wire.  I was able to manipulate the guide wire through the catheter and out the end of the peel-away sheath and in the superior vena cava.  Having done that I was then able to thread the catheter a little further through leaving the guide wire for some support and then gradually removed the peel-away sheath and get the catheter well positioned in the superior vena cava.  Once that was done, the peel-away sheath was removed.  The catheter was aspirated and that worked easily and it was flushed.  It was then brought back through the subcutaneous tunnel into the reservoir site.  It was cut short and the preflushed reservoir attached and the locking mechanism engaged.  The reservoir aspirated and aspirated easily.  The reservoir was sutured to the fascia with some Prolene and then the incision closed with 3-0 Vicryl and 4-0 Monocryl.  Final check with fluoroscopy showed that we had good positioning and no kinking.  The catheter was accessed with  the tubing and the right angle Huber point needle and aspirated, flushed with dilute heparin followed by 5 cc of concentrated aqueous heparin and then closed.  The patient had had sterile dressings applied.  She tolerated the procedure well.  There were no operative complications.  All counts were correct. Dictated by:   Currie Paris, M.D. Attending Physician:  Charlton Haws DD:  11/12/01 TD:  11/12/01 Job: [id number]

## 2011-03-23 NOTE — Consult Note (Signed)
[facility]  Patient:    ROXAN, YAMAMOTO Visit Number: [id number] MRN: [id number]          Service Type: EMS Location: ED Attending Physician:  Donnetta Hutching Dictated by:   Valentino Hue. Magrinat, M.D. Proc. Date: 01/11/02 Admit Date:  01/10/2002 Discharge Date: 01/11/2002   CC:         Lowell C. Catha Gosselin, M.D.   Consultation Report  HISTORY OF PRESENT ILLNESS:  The patient is a 45 year old, [place] woman, followed by Dr. Catha Gosselin for breast cancer, receiving chemotherapy on a weekly basis for advanced stage IIB breast cancer (greater than 10 lymph nodes positive).  She had her most recent chemotherapy approximately 6 days ago.  Tonight the patient called reporting a temperature of greater than 101 degrees. She was asked to come to the emergency room for evaluation.  She has been having a dry mouth but not a sore throat. She has had a sense of fullness but no real headache, no nausea or vomiting, no vision changes. She has had no cough, sputum production, pleurisy, shortness of breath, and no diarrhea, hematuria, urinary frequency or urinary burning. There are no focal symptoms suggestive of a specific site other than the fact that her mouth is dry and she has aches and pains in her joints. Her daughter is sick at home with a fever but has not been specifically diagnosed.  PHYSICAL EXAMINATION:  VITAL SIGNS:  Temperature 101.0, pulse 149, respiratory rate 20, and blood pressure 130/91.  HEENT:  Pupils are equal, round, and reactive to light. The sclerae are not icteric. The oropharynx has mild thrush but fairly definite with no ulceration otherwise noted.  NECK:  Supple. No thyromegaly. No peripheral adenopathy.  LUNGS:  No crackles or wheezes.  HEART:  There is a regular rate and rhythm. No murmur appreciated.  ABDOMEN:  Soft, nontender. Positive bowel sounds. No masses palpated.  MUSCULOSKELETAL:  No focal spinal tenderness; no  peripheral edema.  NEUROLOGIC:  Nonfocal.  LABORATORY AND ACCESSORY DATA:  White cell count 4.5, hemoglobin 9.6, platelet count of 247,000.  Blood cultures were also obtained.  IMPRESSION/PLAN:  This is a 45 year old, [place] woman, with history of breast cancer, stage IIB, undergoing chemotherapy, now with temperature in excess of 101 degrees. No focal findings other than mild thrush.  She received Primaxin in the emergency room. After blood cultures x2 were obtained, the patient was prescribed Diflucan 100 mg to take p.o. q.d. for 5 days for her thrush and Augmentin at 175 mg and Cipro 500 mg to take b.i.d. for 7 days.  She will take some Tylenol and/or Aleve for her bony discomfort, and she is already scheduled to see Dr. Catha Gosselin on Monday, January 12, 2002, for followup. She will let him know how she is doing at that time. Dictated by:   Valentino Hue Magrinat, M.D. Attending Physician:  Donnetta Hutching DD:  01/11/02 TD:  01/12/02 Job: [id number]

## 2011-03-23 NOTE — Op Note (Signed)
[place]. [facility]  Patient:    SYLVANA, BONK Visit Number: [id number] MRN: [id number]          Service Type: DSU Location: [facility] Attending Physician:  Charlton Haws Dictated by:   Currie Paris, M.D. Proc. Date: 07/23/01 Admit Date:  07/23/2001   CC:         Dr. Prudy Feeler, [facility]  Dr. Jess Barters   Operative Report  ACCOUNT NO. [id number].  PREOPERATIVE DIAGNOSIS:  Carcinoma, upper outer quadrant left breast.  POSTOPERATIVE DIAGNOSIS:  Carcinoma, upper outer quadrant left breast, with axillary metastases.  PROCEDURE:  Left partial mastectomy with blue dye injection, sentinel node dissection, and completion axillary dissection.  SURGEON:  Currie Paris, M.D.  ANESTHESIA:  General.  CLINICAL NOTE:  The patient is a 45 year old who recently presented with a left breast mass.  Core biopsy showed carcinoma.  She had no axillary metastases palpable.  DESCRIPTION OF PROCEDURE:  The patient was seen in the holding area and had no further questions.  She had already been injected with some radioactive isotope.  She was taken to the operating room and after satisfactory general anesthesia had been obtained, blue dye was injected subareolarly and massaged for a few minutes.  The mass was outlined and marked prior to anesthesia.  The breast was then prepped and draped as a sterile field.  Using NeoProbe, I identified a hot area in the axilla and made a transverse incision there.  I saw blue immediately and traced a blue lymphatic a little deeper into the axilla, when I found an initial blue node, which I grabbed with a Babcock and excised with the cautery.  A second blue node was visible, and it was also noted to be hot.  The first node had counts as high as 8000.  This second node was clinically positive, was excised.  Using the NeoProbe, I found a third node.  This was more posteriorly, laying right  on the edge of the latissimus, and I excised it.  It had a little blue at one end, was hot at one end, but the other end appeared to have metastatic disease in it.  Through this small incision I could not palpate any other metastases.  A pack was placed while waiting for pathology, and I went ahead to approach the lumpectomy.  I made an elliptical incision to take the overlying skin, since it felt fairly close to the surface, and did a wide partial mastectomy, taking the skin flap superiorly and inferiorly, going well around it, down to the muscle, and taking the full-thickness skin and breast tissue down to the muscle fascia.  In looking at the specimen, I thought I was a little bit close with some firmness at the inferior medial edge.  I took another centimeter of tissue at this exact spot.  The wound was infiltrated with some Marcaine.  The margins of excision were marked.  The wound was irrigated and appeared to be dry.  I reclosed the breast in a couple of layers with 3-0 Vicryl and the skin with 4-0 Monocryl subcuticular.  The pathologist reported that all three sentinel nodes were positive. Attention was returned to the axilla and the skin incision enlarged to go all the way to the level of the latissimus.  The axillary dissection was completed in the usual fashion by opening up a little bit more along the clavipectoral fascia, identifying the medial aspect of the axillary vein, sweeping  the axillary contents out from medial to lateral, and then superior to inferior. Blood vessels were clipped as encountered.  The second intercostal nerve was divided.  The nerve and blood supply of the pectoralis was preserved, as were the long thoracic and thoracodorsals.  The intervening tissue was dissected off and the dissection taken down to the level of the inferior axillary tissue.  At least one other node felt positive.  On further palpation, there were two what appeared to be one or two  small nodes right at the medial aspect high in the axilla that I had left behind, and I was able to dissect those out as well, sent them separately as high left axillary node.  The wound was irrigated, and hemostasis appeared complete.   A drain was placed.  I infiltrated 5 cc of Marcaine at each of four sites, the long thoracic, thoracodorsal, nerve to the pectoralis, and second intercostal, to see if this would help with postop analgesia.  The drain was secured.  The incision was closed in layers with 3-0 Vicryl followed by 4-0 Monocryl subcuticular.  Pathology also reported that the lumpectomy specimen appeared to have negative margins.  The patient tolerated the procedure well.  There were no operative complications.  All counts were correct. Dictated by:   Currie Paris, M.D. Attending Physician:  Charlton Haws DD:  07/23/01 TD:  07/23/01 Job: [id number]

## 2011-03-23 NOTE — Op Note (Signed)
[place]. [facility]  Patient:    Tara Bell, Tara Bell                    MRN: [id number] Proc. Date: 07/04/00 Adm. Date:  [id number] Disc. Date: [id number] Attending:  Evlyn Clines CC:         Colon Flattery, D.O.                           Operative Report  PREOPERATIVE DIAGNOSIS:  Right flank pain and hematuria.  POSTOPERATIVE DIAGNOSIS:  Right flank pain and hematuria with possibly right pelvic mass.  OPERATION:  Cystoscopy, right retrograde pyelogram with interpretation, examination under anesthesia.  SURGEON:  Excell Seltzer. Annabell Howells, M.D.  ANESTHESIA:  General  COMPLICATIONS:  None.  INDICATIONS:  Tara Bell is a 45 year old black female who was initially sent for complaints of right flank pain and hematuria.  CT scan revealed a question of dilation of the right proximal ureter and kidney but no stone was seen.  An IVP was performed as well which was not clear.  She has continued to have symptoms and has had some microhematuria. In light of this, it was felt that cystogram and retrograde pyelogram was indicated to rule out a small stone.  FINDINGS AND PROCEDURE:  The patient was taken to the operating room where general anesthetic was induced. She had been given p.o. antibiotic preoperatively. She was placed in the lithotomy position.  The perineum and genitalia were prepped and draped with Betadine solution and she was draped in the usual sterile fashion. Cystoscopy was performed using a 22 [place] scope and 12 and 70 degree lenses.  Examination revealed a normal urethra. There were a few small benign polyps at the bladder neck.  The bladder wall was smooth and pale without tumor, stones or inflammation.  The ureteral orifices were in their normal anatomic position.  The right ureteral orifice was cannulated with a 5 [place] open end catheter. Contrast was instilled in a retrograde fashion.  This study demonstrated a normal ureter throughout  its length.  The internal collecting system was delicate without hydronephrosis or filling defect.  After completion of the retrograde, the kidney drained completely.  After I completed the retrograde,  I filled the bladder to capacity under anesthesia.  She held 550 cc.  There were a few glomerular images after dilation at the base of the bladder but nothing on the posterior wall suggesting this is not interstitial cystitis.  I also performed exam under anesthesia.  This revealed no palpable bladder masses.  The cervix and uterus were unremarkable; however, in the right cul-de-sac there was a palpable firm mass measuring approximately 3 x 4 cm which potentially could represent an ovarian cyst or other GYN pathology.  At this point, the patient was taken down from lithotomy position.  Her anesthetic was reversed.  She was taken to the recovery room in stable condition.  There were no complications. I will obtain a pelvic ultrasound to further clarify the nature of the pelvic lesion. Additional workup will be based on the results of that study. DD:  07/04/00 TD:  07/05/00 Job: [id number]

## 2011-05-29 ENCOUNTER — Other Ambulatory Visit: Payer: Self-pay | Admitting: *Deleted

## 2011-05-29 NOTE — Telephone Encounter (Addendum)
pateint called for a refill on Motrin 600mg . Please advise

## 2011-05-30 MED ORDER — IBUPROFEN 600 MG PO TABS: 600.0000 mg | ORAL_TABLET | Freq: Four times a day (QID) | ORAL | Status: DC | PRN

## 2011-07-27 LAB — I-STAT 8, (EC8 V) (CONVERTED LAB)
Acid-Base Excess: 4 — ABNORMAL HIGH
Operator id: 268271
TCO2: 27
pCO2, Ven: 28.7 — ABNORMAL LOW
pH, Ven: 7.563 — ABNORMAL HIGH

## 2011-08-06 DIAGNOSIS — Z1371 Encounter for nonprocreative screening for genetic disease carrier status: Secondary | ICD-10-CM

## 2011-08-06 HISTORY — DX: Encounter for nonprocreative screening for genetic disease carrier status: Z13.71

## 2011-09-10 ENCOUNTER — Ambulatory Visit
Admission: RE | Admit: 2011-09-10 | Discharge: 2011-09-10 | Disposition: A | Discharge status: Home / Self Care | Payer: 59 | Source: Ambulatory Visit | Attending: Oncology | Admitting: Oncology

## 2011-09-10 ENCOUNTER — Ambulatory Visit: Payer: 59

## 2011-09-10 DIAGNOSIS — Z1231 Encounter for screening mammogram for malignant neoplasm of breast: Secondary | ICD-10-CM

## 2011-10-08 ENCOUNTER — Telehealth: Payer: Self-pay

## 2011-10-08 NOTE — Telephone Encounter (Signed)
SPOKE WITH Tara Bell IN [facility] SCHEDULING AND GAVE HER THIS MESSAGE.  SHE WILL CALL PT.  AND RESCHEDULE APPOINTMENT.

## 2011-10-15 DIAGNOSIS — I10 Essential (primary) hypertension: Secondary | ICD-10-CM | POA: Insufficient documentation

## 2011-10-15 DIAGNOSIS — Z1371 Encounter for nonprocreative screening for genetic disease carrier status: Secondary | ICD-10-CM | POA: Insufficient documentation

## 2011-10-15 DIAGNOSIS — Z1501 Genetic susceptibility to malignant neoplasm of breast: Secondary | ICD-10-CM | POA: Insufficient documentation

## 2011-10-22 ENCOUNTER — Other Ambulatory Visit: Payer: Self-pay | Admitting: Women's Health

## 2011-10-22 ENCOUNTER — Other Ambulatory Visit (HOSPITAL_COMMUNITY)
Admission: RE | Admit: 2011-10-22 | Discharge: 2011-10-22 | Disposition: A | Discharge status: Home / Self Care | Payer: 59 | Source: Ambulatory Visit | Attending: Obstetrics and Gynecology | Admitting: Obstetrics and Gynecology

## 2011-10-22 ENCOUNTER — Encounter: Payer: Self-pay | Admitting: Women's Health

## 2011-10-22 ENCOUNTER — Ambulatory Visit (INDEPENDENT_AMBULATORY_CARE_PROVIDER_SITE_OTHER): Payer: 59 | Admitting: Women's Health

## 2011-10-22 VITALS — BP 110/72 | Ht 60.25 in | Wt 136.0 lb

## 2011-10-22 DIAGNOSIS — Z01419 Encounter for gynecological examination (general) (routine) without abnormal findings: Secondary | ICD-10-CM

## 2011-10-22 DIAGNOSIS — N898 Other specified noninflammatory disorders of vagina: Secondary | ICD-10-CM

## 2011-10-22 DIAGNOSIS — R102 Pelvic and perineal pain unspecified side: Secondary | ICD-10-CM

## 2011-10-22 DIAGNOSIS — N949 Unspecified condition associated with female genital organs and menstrual cycle: Secondary | ICD-10-CM

## 2011-10-22 MED ORDER — FLUCONAZOLE 150 MG PO TABS: 150.0000 mg | ORAL_TABLET | Freq: Once | ORAL | Status: AC

## 2011-10-22 MED ORDER — METRONIDAZOLE 0.75 % VA GEL: VAGINAL | Status: AC

## 2011-10-22 NOTE — Progress Notes (Signed)
Tara Bell 05/17/1966 [id number]    History:    The patient presents for annual exam.  Monthly 6 day cycle/BTL. Same partner greater than 4 years. Complaint of increased discharge with an odor and occasional right lower quadrant pain. History of left breast cancer in 03, lumpectomy, chemo, radiation. BRCA 1 negative, BRCA 2 inconclusive. Normal mammograms after 04. History normal Paps. History of fibroids.  Past medical history, past surgical history, family history and social history were all reviewed and documented in the EPIC chart. Hypertension,   ROS:  A  ROS was performed and pertinent positives and negatives are included in the history.  Exam:  Filed Vitals:   10/22/11 1609  BP: 110/72    General appearance:  Normal Head/Neck:  Normal, without cervical or supraclavicular adenopathy. Thyroid:  Symmetrical, normal in size, without palpable masses or nodularity. Respiratory  Effort:  Normal  Auscultation:  Clear without wheezing or rhonchi Cardiovascular  Auscultation:  Regular rate, without rubs, murmurs or gallops  Edema/varicosities:  Not grossly evident Abdominal  Soft,nontender, without masses, guarding or rebound.  Liver/spleen:  No organomegaly noted  Hernia:  None appreciated  Skin  Inspection:  Grossly normal  Palpation:  Grossly normal Neurologic/psychiatric  Orientation:  Normal with appropriate conversation.  Mood/affect:  Normal  Genitourinary    Breasts: Examined lying and sitting/implants.     Right: Without masses, retractions, discharge or axillary adenopathy/scarring from Port-A-Cath.     Left: Without masses, retractions, discharge or axillary adenopathy.   Inguinal/mons:  Normal without inguinal adenopathy  External genitalia:  Normal  BUS/Urethra/Skene's glands:  Normal  Bladder:  Normal  Vagina:  Adherent white discharge with odor  Cervix:  Normal  Uterus:  Bulky/ fibroid.  Midline and mobile  Adnexa/parametria:     Rt: Without  masses, or fullness, slight tenderness    Lt: Without masses or tenderness.  Anus and perineum: Normal  Digital rectal exam: Normal sphincter tone without palpated masses or tenderness  Assessment/Plan:  45 y.o. SBF G2 P2 for annual exam.    BV/yeast Right lower quadrant pain/intermittent/fibroids Left breast cancer history/Dr. Darrold Span Hypertension/primary care labs and meds  Plan: MetroGel vaginal cream 1 applicator at bedtime x5, Diflucan 150 by mouth x1 dose with a refill. Instructed to call or return if no relief. SBEs, continue annual screening. Encouraged increase exercise, calcium rich diet, vitamin D 1000 daily. Pelvic ultrasound, schedule after next cycle. Pap only.    Harrington Challenger [facility], 4:48 PM 10/22/2011

## 2011-10-22 NOTE — Telephone Encounter (Signed)
Call pt- ov if no relief

## 2011-11-01 ENCOUNTER — Other Ambulatory Visit: Payer: Self-pay | Admitting: Oncology

## 2011-11-01 ENCOUNTER — Telehealth: Payer: Self-pay | Admitting: Oncology

## 2011-11-01 DIAGNOSIS — Z1501 Genetic susceptibility to malignant neoplasm of breast: Secondary | ICD-10-CM

## 2011-11-01 DIAGNOSIS — Z853 Personal history of malignant neoplasm of breast: Secondary | ICD-10-CM

## 2011-11-01 DIAGNOSIS — Z9889 Other specified postprocedural states: Secondary | ICD-10-CM

## 2011-11-01 NOTE — Telephone Encounter (Signed)
[facility] to schedule MRI of breast for pt, she will call pt with appt. Innformed pt that Amy will call, bu also instructed her to follow with [facility] and MRI of breast has to be done before MD visit on 11/27/11

## 2011-11-08 ENCOUNTER — Ambulatory Visit: Payer: 59

## 2011-11-08 ENCOUNTER — Ambulatory Visit: Payer: 59 | Admitting: Women's Health

## 2011-11-08 ENCOUNTER — Other Ambulatory Visit: Payer: 59

## 2011-11-08 ENCOUNTER — Other Ambulatory Visit: Payer: Self-pay | Admitting: Women's Health

## 2011-11-08 ENCOUNTER — Other Ambulatory Visit: Payer: 59 | Admitting: Women's Health

## 2011-11-12 ENCOUNTER — Ambulatory Visit
Admission: RE | Admit: 2011-11-12 | Discharge: 2011-11-12 | Disposition: A | Discharge status: Home / Self Care | Payer: 59 | Source: Ambulatory Visit | Attending: Oncology | Admitting: Oncology

## 2011-11-12 DIAGNOSIS — Z9889 Other specified postprocedural states: Secondary | ICD-10-CM

## 2011-11-12 DIAGNOSIS — Z853 Personal history of malignant neoplasm of breast: Secondary | ICD-10-CM

## 2011-11-12 DIAGNOSIS — Z1501 Genetic susceptibility to malignant neoplasm of breast: Secondary | ICD-10-CM

## 2011-11-12 MED ORDER — GADOBENATE DIMEGLUMINE 529 MG/ML IV SOLN
12.0000 mL | Freq: Once | INTRAVENOUS | Status: AC | PRN
  Administered 2011-11-12: 12 mL via INTRAVENOUS

## 2011-11-27 ENCOUNTER — Ambulatory Visit (HOSPITAL_BASED_OUTPATIENT_CLINIC_OR_DEPARTMENT_OTHER): Payer: 59 | Admitting: Oncology

## 2011-11-27 ENCOUNTER — Other Ambulatory Visit: Payer: 59 | Admitting: Lab

## 2011-11-27 ENCOUNTER — Telehealth: Payer: Self-pay | Admitting: Oncology

## 2011-11-27 VITALS — BP 115/77 | HR 73 | Temp 98.0°F | Ht 60.0 in | Wt 135.1 lb

## 2011-11-27 DIAGNOSIS — D509 Iron deficiency anemia, unspecified: Secondary | ICD-10-CM

## 2011-11-27 DIAGNOSIS — Z1231 Encounter for screening mammogram for malignant neoplasm of breast: Secondary | ICD-10-CM

## 2011-11-27 DIAGNOSIS — D649 Anemia, unspecified: Secondary | ICD-10-CM

## 2011-11-27 DIAGNOSIS — C50919 Malignant neoplasm of unspecified site of unspecified female breast: Secondary | ICD-10-CM

## 2011-11-27 LAB — COMPREHENSIVE METABOLIC PANEL
AST: 19 U/L (ref 0–37)
Albumin: 4.3 g/dL (ref 3.5–5.2)
Alkaline Phosphatase: 54 U/L (ref 39–117)
Calcium: 9.2 mg/dL (ref 8.4–10.5)
Chloride: 102 mEq/L (ref 96–112)
Glucose, Bld: 108 mg/dL — ABNORMAL HIGH (ref 70–99)
Potassium: 3.9 mEq/L (ref 3.5–5.3)
Sodium: 138 mEq/L (ref 135–145)
Total Protein: 7.5 g/dL (ref 6.0–8.3)

## 2011-11-27 LAB — CBC WITH DIFFERENTIAL/PLATELET
Basophils Absolute: 0 10*3/uL (ref 0.0–0.1)
Eosinophils Absolute: 0.1 10*3/uL (ref 0.0–0.5)
HGB: 11.1 g/dL — ABNORMAL LOW (ref 11.6–15.9)
MCV: 87.7 fL (ref 79.5–101.0)
MONO#: 0.4 10*3/uL (ref 0.1–0.9)
MONO%: 5.8 % (ref 0.0–14.0)
NEUT#: 5.4 10*3/uL (ref 1.5–6.5)
RBC: 3.78 10*6/uL (ref 3.70–5.45)
RDW: 16 % — ABNORMAL HIGH (ref 11.2–14.5)
WBC: 7.3 10*3/uL (ref 3.9–10.3)
lymph#: 1.4 10*3/uL (ref 0.9–3.3)

## 2011-11-27 LAB — IRON AND TIBC: UIBC: 389 ug/dL (ref 125–400)

## 2011-11-27 NOTE — Patient Instructions (Signed)
You are a little anemic and need to begin iron.  Integra (or over the counter ferrous fumarate or ferrous gluconate) is generally easy to take --  1 daily on empty stomach with orange juice for best absorption.  You should have MRI within 3 months of your mammograms next year --  Mammograms will be ~ Sep 10, 2012.

## 2011-11-27 NOTE — Progress Notes (Signed)
OFFICE PROGRESS NOTE Date of Visit  Nov 27, 2011 Physicians: V.Bland, G.Truesdale, J.Fernandez  INTERVAL HISTORY:   Patient is seen, alone for visit today, in continuing attention to her history of breast cancer, BRCA 2 positive. 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 The tumor was triple negative (ER/PR  negative and  HER 2 1+ ,CISH not done in 2002), treated with left partial mastectomy with axillary node evaluation by Dr.Streck, adjuvant chemotherapy with adriamycin/cytoxan x 4 followed by taxotere weekly x 12 by Dr. Lyndal Pulley, radiation to breast/axilla/supraclavicular region by Dr.Kinard, and since then on observation without known active disease. 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  Most recent bilateral mammograms at [facility] were Sep 10, 2011, still extremely dense breast tissue but no other mammographic findings of concern. 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  Raela reports increased tenderness in right breast after MRI, tho this is improved now. She saw Dr.Truesdale as she wondered if this was related to the breast implant, however this does not seem to be the case. He suggested removing and replacing the left implant to attempt better symmetry, however she does not want more surgery.  On review of systems otherwise: menstrual periods regular. She has not taken supplemental iron. No recent infectious illness. No respiratory, neurologic, GI, musculoskeletal, urinary, hematologic complaints. Appetite at baseline.  No other concerns on breast self exam. Remainder of full 10 point Review of Systems negative/ unchanged.  Objective:  Vital signs in last 24 hours:  BP 115/77  Pulse 73  Temp(Src) 98 F (36.7 C) (Oral)  Ht 5' (1.524 m)  Wt 135 lb 1.6 oz (61.281 kg)  BMI 26.38 kg/m2  Weight is down 1 lb from May 2012. Alert, looks comfortable, easily mobile. No tearing during entire visit.  HEENT:mucous membranes moist, pharynx normal without lesions. Slightly pale mucous membranes. PERRL, conjunctivae and sclerae clear.   LymphaticsCervical, supraclavicular, and axillary nodes normal. No inguinal adenopathy Resp: clear to auscultation bilaterally and normal percussion bilaterally Cardio: regular rate and rhythm GI: soft, non-tender; bowel sounds normal; no masses,  no organomegaly Extremities: extremities normal, atraumatic, no cyanosis or edema Breasts: left somewhat smaller and higher on chest wall than right as previously, no dominant mass, no skin changes of concern, no nipple findings, nothing in left axilla, no increased swelling LUE. Right breast not tender, no dominant masses,no skin changes or nipple findings, axilla and arm not remarkable. Skin not remarkable Neuro nonfocal  Lab Results:   Basename 11/27/11 1136  WBC 7.3  HGB 11.1*  HCT 33.2*  PLT 321  ANC 5.4, MCV 87, RDW 16  BMET  Basename 11/27/11 1136  NA 138  K 3.9  CL 102  CO2 22  GLUCOSE 108*  BUN 12  CREATININE 0.84  CALCIUM 9.2  remainder of full CMET WNL  Ferritin 12, iron 30, %sat 7 --  These labs available after visit and will be communicated to patient.  Studies/Results:  No results found.  Medications: I have reviewed the patient's current medications.  Assessment/Plan:  1. History of 9 node positive  triple negative left breast cancer BRCA 2 positive, now out over 10 years from diagnosis in patient still premenopausal, post treatment as above and on observation. She has not been interested in  prophylactic mastectomies, is followed regularly by gyn. Yearly breast MRIs are medically necessary with BRCA mutation. She will have mammograms in Nov. 2013 and  I will see her back in a year or sooner if needed. The breast MRI will need to be done within 3 months of the mammograms. 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 3. LUE lymphedema: managing well at present 4.Migraine HA controlled with present interventions. 5. Bilateral subpectoral breast implants: no problems noted on recent MRI 6. Iron deficiency with progressive mild anemia: likely from menstrual blood loss + lab blood draws x years.  I have asked her to begin Integra (or ferrous fumarate or ferrous gluconate) daily, on empty stomach with OJ if possible. Repeat labs by primary and at return visit here.       Reece Packer, MD   11/27/2011, 6:11 PM

## 2011-11-27 NOTE — Telephone Encounter (Signed)
gv pt appt schedule for jan 2014 and mammo for 09/11/11. Per pt sent message to [facility] asking if she should have mri after mammo.

## 2011-12-03 ENCOUNTER — Other Ambulatory Visit: Payer: Self-pay

## 2011-12-03 DIAGNOSIS — D509 Iron deficiency anemia, unspecified: Secondary | ICD-10-CM

## 2011-12-03 MED ORDER — INTEGRA 62.5-62.5-40-3 MG PO CAPS: ORAL_CAPSULE | ORAL | Status: DC

## 2012-02-28 ENCOUNTER — Encounter: Payer: Self-pay | Admitting: Women's Health

## 2012-02-28 ENCOUNTER — Ambulatory Visit (INDEPENDENT_AMBULATORY_CARE_PROVIDER_SITE_OTHER): Payer: 59 | Admitting: Women's Health

## 2012-02-28 DIAGNOSIS — B009 Herpesviral infection, unspecified: Secondary | ICD-10-CM

## 2012-02-28 DIAGNOSIS — Z113 Encounter for screening for infections with a predominantly sexual mode of transmission: Secondary | ICD-10-CM

## 2012-02-28 DIAGNOSIS — Z1501 Genetic susceptibility to malignant neoplasm of breast: Secondary | ICD-10-CM

## 2012-02-28 MED ORDER — VALACYCLOVIR HCL 500 MG PO TABS: ORAL_TABLET | ORAL | Status: DC

## 2012-02-28 NOTE — Patient Instructions (Signed)
Herpes Simplex Herpes simplex is generally classified as Type 1 or Type 2. Type 1 is generally the type that is responsible for cold sores. Type 2 is generally associated with sexually transmitted diseases. We now know that most of the thoughts on these viruses are inaccurate. We find that HSV1 is also present genitally and HSV2 can be present orally, but this will vary in different locations of the world. Herpes simplex is usually detected by doing a culture. Blood tests are also available for this virus; however, the accuracy is often not as good.  PREPARATION FOR TEST No preparation or fasting is necessary. NORMAL FINDINGS  No virus present   No HSV antigens or antibodies present  Ranges for normal findings may vary among different laboratories and hospitals. You should always check with your doctor after having lab work or other tests done to discuss the meaning of your test results and whether your values are considered within normal limits. MEANING OF TEST  Your caregiver will go over the test results with you and discuss the importance and meaning of your results, as well as treatment options and the need for additional tests if necessary. OBTAINING THE TEST RESULTS  It is your responsibility to obtain your test results. Ask the lab or department performing the test when and how you will get your results. Document Released: 11/24/2004 Document Revised: 10/11/2011 Document Reviewed: 10/02/2008 Exit[employer] Patient Information 2012 [employer]. 

## 2012-02-28 NOTE — Progress Notes (Signed)
Patient ID: Tara Bell, female   DOB: 1966-07-19, 46 y.o.   MRN: [id number] Presents with complaint of a blistering rash in pubic area and near rectum for several days. Tender to touch. Denies any vaginal discharge or changes. Questions fidelity of partner. History of a monthly cycle/BTL.  Exam: Several open blisters on suprapubic area and 2 areas on right perineum. HSV culture taken and is pending. Speculum exam no discharge, erythema, odor, GC/ chlamydia culture taken and is pending. Bimanual no CMT or adnexal fullness or tenderness.  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 Valtrex 500 by mouth twice daily for 5 days, reviewed possible suppression therapy or using for outbreaks only. Encouraged condoms. HIV, hepatitis B. and C., RPR.

## 2012-02-29 LAB — HEPATITIS C ANTIBODY: HCV Ab: NEGATIVE

## 2012-02-29 LAB — GC/CHLAMYDIA PROBE AMP, GENITAL
Chlamydia, DNA Probe: NEGATIVE
GC Probe Amp, Genital: NEGATIVE

## 2012-02-29 LAB — HIV ANTIBODY (ROUTINE TESTING W REFLEX): HIV: NONREACTIVE

## 2012-02-29 LAB — RPR

## 2012-05-29 ENCOUNTER — Other Ambulatory Visit: Payer: Self-pay | Admitting: Women's Health

## 2012-06-23 ENCOUNTER — Telehealth: Payer: Self-pay | Admitting: *Deleted

## 2012-06-23 NOTE — Telephone Encounter (Signed)
Patient has a history of breast cancer, best not to use birth control pills. Mirena IUD may be a good option for cycle control and contraception if so chooses.

## 2012-06-23 NOTE — Telephone Encounter (Signed)
Pt is calling requesting to speak with you about starting birth control pills to stop cycle from starting. Pt is planning trip to beach with her daughter. [phone].

## 2012-07-18 ENCOUNTER — Ambulatory Visit (INDEPENDENT_AMBULATORY_CARE_PROVIDER_SITE_OTHER): Payer: 59 | Admitting: Women's Health

## 2012-07-18 ENCOUNTER — Encounter: Payer: Self-pay | Admitting: Women's Health

## 2012-07-18 ENCOUNTER — Telehealth: Payer: Self-pay | Admitting: *Deleted

## 2012-07-18 DIAGNOSIS — N644 Mastodynia: Secondary | ICD-10-CM

## 2012-07-18 NOTE — Telephone Encounter (Signed)
Appointment 07/23/12 at [facility].

## 2012-07-18 NOTE — Telephone Encounter (Signed)
Order placed

## 2012-07-18 NOTE — Telephone Encounter (Signed)
Message copied by Aura Camps on Fri Jul 18, 2012 12:25 PM ------      Message from: [place], [place] J      Created: Fri Jul 18, 2012 11:41 AM       Please schedule diag mammogram  Rt breast pain esp outer aspect, normal exam.  Hx implants (06) lt breast ca age 46, lumpectomy, chemo and radiation.  BRCA 2 inconclusive, BRCA 1 neg.             Best time for pt is after 5,  [facility]

## 2012-07-18 NOTE — Progress Notes (Signed)
Patient ID: Tara Bell, female   DOB: 03/26/1966, 46 y.o.   MRN: [id number] Presents with complaint of right breast tenderness for 1 week states cannot sleep on her right breast, pain mostly outer quadrant.  History of left breast cancer treated with lumpectomy, radiation, chemotherapy in 03, breast implants 2008. States had breast tenderness after implants. Minimal caffeine intake, denies injury. BRCA 1 negative, BRCA 2 inconclusive. Last mammogram November 2012 normal.  Exam: Breast exam sitting and lying position within normal limits no palpable nodules visible retractions or dimpling. Right breast more tender than left.   Right breast mastodynia  for 1 week  Plan: Diagnostic mammogram, will schedule. Reviewed importance to continue SBE's.

## 2012-07-23 ENCOUNTER — Ambulatory Visit
Admission: RE | Admit: 2012-07-23 | Discharge: 2012-07-23 | Disposition: A | Discharge status: Home / Self Care | Payer: 59 | Source: Ambulatory Visit | Attending: Women's Health | Admitting: Women's Health

## 2012-07-23 DIAGNOSIS — N644 Mastodynia: Secondary | ICD-10-CM

## 2012-08-26 ENCOUNTER — Other Ambulatory Visit: Payer: Self-pay | Admitting: Women's Health

## 2012-09-10 ENCOUNTER — Ambulatory Visit: Payer: 59

## 2012-09-15 ENCOUNTER — Ambulatory Visit: Payer: 59

## 2012-09-16 ENCOUNTER — Other Ambulatory Visit: Payer: Self-pay | Admitting: Women's Health

## 2012-09-29 ENCOUNTER — Ambulatory Visit: Payer: 59

## 2012-10-24 ENCOUNTER — Ambulatory Visit (INDEPENDENT_AMBULATORY_CARE_PROVIDER_SITE_OTHER): Payer: 59 | Admitting: Women's Health

## 2012-10-24 ENCOUNTER — Encounter: Payer: Self-pay | Admitting: Women's Health

## 2012-10-24 VITALS — BP 114/68 | Ht 60.0 in | Wt 146.0 lb

## 2012-10-24 DIAGNOSIS — N898 Other specified noninflammatory disorders of vagina: Secondary | ICD-10-CM

## 2012-10-24 DIAGNOSIS — D251 Intramural leiomyoma of uterus: Secondary | ICD-10-CM | POA: Insufficient documentation

## 2012-10-24 DIAGNOSIS — B009 Herpesviral infection, unspecified: Secondary | ICD-10-CM

## 2012-10-24 DIAGNOSIS — Z01419 Encounter for gynecological examination (general) (routine) without abnormal findings: Secondary | ICD-10-CM

## 2012-10-24 MED ORDER — METRONIDAZOLE 0.75 % VA GEL: VAGINAL | Status: DC

## 2012-10-24 MED ORDER — VALACYCLOVIR HCL 500 MG PO TABS: 500.0000 mg | ORAL_TABLET | Freq: Two times a day (BID) | ORAL | Status: DC

## 2012-10-24 NOTE — Progress Notes (Signed)
Tara Bell 05-08-1966 [id number]    History:    The patient presents for annual exam.  History of left breast cancer in 2002 with lumpectomy, radiation, chemotherapy. BRCA 1 negative, BRCA 2 inconclusive. Hypertensive. Primary care labs and meds. Regular monthly 5-6 day cycles/BTL. Polypectomy/myomectomy 2009. History of normal Paps. Same partner.  Past medical history, past surgical history, family history and social history were all reviewed and documented in the EPIC chart. Works at [employer]. Both daughters at [address] in [place] and doing well. Breast augmentation 2008. Numerous family members with hypertension.  ROS:  A  ROS was performed and pertinent positives and negatives are included in the history.  Exam:  Filed Vitals:   10/24/12 1532  BP: 114/68    General appearance:  Normal Head/Neck:  Normal, without cervical or supraclavicular adenopathy. Thyroid:  Symmetrical, normal in size, without palpable masses or nodularity. Respiratory  Effort:  Normal  Auscultation:  Clear without wheezing or rhonchi Cardiovascular  Auscultation:  Regular rate, without rubs, murmurs or gallops  Edema/varicosities:  Not grossly evident Abdominal  Soft,nontender, without masses, guarding or rebound.  Liver/spleen:  No organomegaly noted  Hernia:  None appreciated  Skin  Inspection:  Grossly normal  Palpation:  Grossly normal Neurologic/psychiatric  Orientation:  Normal with appropriate conversation.  Mood/affect:  Normal  Genitourinary    Breasts: Examined lying and sitting saline implants.     Right: Without masses, retractions, discharge or axillary adenopathy.     Left: Without masses, retractions, discharge or axillary adenopathy.   Inguinal/mons:  Normal without inguinal adenopathy  External genitalia:  Normal  BUS/Urethra/Skene's glands:  Normal  Bladder:  Normal  Vagina:   wet prep positive for amines, clues, TNTC bacteria  Cervix:  Normal  Uterus:   8 weeks'  size/fibroids   Midline and mobile  Adnexa/parametria:     Rt: Without masses or tenderness.   Lt: Without masses or tenderness.  Anus and perineum: Normal  Digital rectal exam: Normal sphincter tone without palpated masses or tenderness  Assessment/Plan:  46 y.o. SBF G2 P2 for annual exam with complaint of vaginal discharge.  BV Fibroid Left breast cancer 2003 lumpectomy, chemotherapy, radiation  BRCA1 negative, BRCA 2 inconclusive /Dr. Darrold Span  Hypertension-primary care labs and meds  Plan: MetroGel vaginal cream 1 applicator at bedtime x5. Alcohol precautions reviewed. Instructed to call if no relief of symptoms. SBE's, continue annual mammogram, 3 D tomography reviewed and encouraged. Keep scheduled appointment in January, inquire on when to do colonoscopy. Reviewed importance of exercise, low-fat diet, calcium rich foods, vitamin D 2000 daily encouraged. Pap normal 2012, new screening guidelines reviewed.  Harrington Challenger WHNP, 4:10 PM 10/24/2012

## 2012-10-24 NOTE — Patient Instructions (Addendum)
Vit d 2000 daily Health Maintenance, Females A healthy lifestyle and preventative care can promote health and wellness.  Maintain regular health, dental, and eye exams.  Eat a healthy diet. Foods like vegetables, fruits, whole grains, low-fat dairy products, and lean protein foods contain the nutrients you need without too many calories. Decrease your intake of foods high in solid fats, added sugars, and salt. Get information about a proper diet from your caregiver, if necessary.  Regular physical exercise is one of the most important things you can do for your health. Most adults should get at least 150 minutes of moderate-intensity exercise (any activity that increases your heart rate and causes you to sweat) each week. In addition, most adults need muscle-strengthening exercises on 2 or more days a week.   Maintain a healthy weight. The body mass index (BMI) is a screening tool to identify possible weight problems. It provides an estimate of body fat based on height and weight. Your caregiver can help determine your BMI, and can help you achieve or maintain a healthy weight. For adults 20 years and older:  A BMI below 18.5 is considered underweight.  A BMI of 18.5 to 24.9 is normal.  A BMI of 25 to 29.9 is considered overweight.  A BMI of 30 and above is considered obese.  Maintain normal blood lipids and cholesterol by exercising and minimizing your intake of saturated fat. Eat a balanced diet with plenty of fruits and vegetables. Blood tests for lipids and cholesterol should begin at age 20 and be repeated every 5 years. If your lipid or cholesterol levels are high, you are over 50, or you are a high risk for heart disease, you may need your cholesterol levels checked more frequently.Ongoing high lipid and cholesterol levels should be treated with medicines if diet and exercise are not effective.  If you smoke, find out from your caregiver how to quit. If you do not use tobacco, do not  start.  If you are pregnant, do not drink alcohol. If you are breastfeeding, be very cautious about drinking alcohol. If you are not pregnant and choose to drink alcohol, do not exceed 1 drink per day. One drink is considered to be 12 ounces (355 mL) of beer, 5 ounces (148 mL) of wine, or 1.5 ounces (44 mL) of liquor.  Avoid use of street drugs. Do not share needles with anyone. Ask for help if you need support or instructions about stopping the use of drugs.  High blood pressure causes heart disease and increases the risk of stroke. Blood pressure should be checked at least every 1 to 2 years. Ongoing high blood pressure should be treated with medicines, if weight loss and exercise are not effective.  If you are 55 to 45 years old, ask your caregiver if you should take aspirin to prevent strokes.  Diabetes screening involves taking a blood sample to check your fasting blood sugar level. This should be done once every 3 years, after age 45, if you are within normal weight and without risk factors for diabetes. Testing should be considered at a younger age or be carried out more frequently if you are overweight and have at least 1 risk factor for diabetes.  Breast cancer screening is essential preventative care for women. You should practice "breast self-awareness." This means understanding the normal appearance and feel of your breasts and may include breast self-examination. Any changes detected, no matter how small, should be reported to a caregiver. Women in their 20s   and 30s should have a clinical breast exam (CBE) by a caregiver as part of a regular health exam every 1 to 3 years. After age 40, women should have a CBE every year. Starting at age 40, women should consider having a mammogram (breast X-ray) every year. Women who have a family history of breast cancer should talk to their caregiver about genetic screening. Women at a high risk of breast cancer should talk to their caregiver about having  an MRI and a mammogram every year.  The Pap test is a screening test for cervical cancer. Women should have a Pap test starting at age 21. Between ages 21 and 29, Pap tests should be repeated every 2 years. Beginning at age 30, you should have a Pap test every 3 years as long as the past 3 Pap tests have been normal. If you had a hysterectomy for a problem that was not cancer or a condition that could lead to cancer, then you no longer need Pap tests. If you are between ages 65 and 70, and you have had normal Pap tests going back 10 years, you no longer need Pap tests. If you have had past treatment for cervical cancer or a condition that could lead to cancer, you need Pap tests and screening for cancer for at least 20 years after your treatment. If Pap tests have been discontinued, risk factors (such as a new sexual partner) need to be reassessed to determine if screening should be resumed. Some women have medical problems that increase the chance of getting cervical cancer. In these cases, your caregiver may recommend more frequent screening and Pap tests.  The human papillomavirus (HPV) test is an additional test that may be used for cervical cancer screening. The HPV test looks for the virus that can cause the cell changes on the cervix. The cells collected during the Pap test can be tested for HPV. The HPV test could be used to screen women aged 30 years and older, and should be used in women of any age who have unclear Pap test results. After the age of 30, women should have HPV testing at the same frequency as a Pap test.  Colorectal cancer can be detected and often prevented. Most routine colorectal cancer screening begins at the age of 50 and continues through age 75. However, your caregiver may recommend screening at an earlier age if you have risk factors for colon cancer. On a yearly basis, your caregiver may provide home test kits to check for hidden blood in the stool. Use of a small camera at  the end of a tube, to directly examine the colon (sigmoidoscopy or colonoscopy), can detect the earliest forms of colorectal cancer. Talk to your caregiver about this at age 50, when routine screening begins. Direct examination of the colon should be repeated every 5 to 10 years through age 75, unless early forms of pre-cancerous polyps or small growths are found.  Hepatitis C blood testing is recommended for all people born from 1945 through 1965 and any individual with known risks for hepatitis C.  Practice safe sex. Use condoms and avoid high-risk sexual practices to reduce the spread of sexually transmitted infections (STIs). Sexually active women aged 25 and younger should be checked for Chlamydia, which is a common sexually transmitted infection. Older women with new or multiple partners should also be tested for Chlamydia. Testing for other STIs is recommended if you are sexually active and at increased risk.  Osteoporosis is a   disease in which the bones lose minerals and strength with aging. This can result in serious bone fractures. The risk of osteoporosis can be identified using a bone density scan. Women ages 65 and over and women at risk for fractures or osteoporosis should discuss screening with their caregivers. Ask your caregiver whether you should be taking a calcium supplement or vitamin D to reduce the rate of osteoporosis.  Menopause can be associated with physical symptoms and risks. Hormone replacement therapy is available to decrease symptoms and risks. You should talk to your caregiver about whether hormone replacement therapy is right for you.  Use sunscreen with a sun protection factor (SPF) of 30 or greater. Apply sunscreen liberally and repeatedly throughout the day. You should seek shade when your shadow is shorter than you. Protect yourself by wearing long sleeves, pants, a wide-brimmed hat, and sunglasses year round, whenever you are outdoors.  Notify your caregiver of new  moles or changes in moles, especially if there is a change in shape or color. Also notify your caregiver if a mole is larger than the size of a pencil eraser.  Stay current with your immunizations. Document Released: 05/07/2011 Document Revised: 01/14/2012 Document Reviewed: 05/07/2011 Exit[employer] Patient Information 2013 [employer].  

## 2012-10-25 LAB — URINALYSIS W MICROSCOPIC + REFLEX CULTURE
Bilirubin Urine: NEGATIVE
Casts: NONE SEEN
Crystals: NONE SEEN
Glucose, UA: NEGATIVE mg/dL
Ketones, ur: NEGATIVE mg/dL
Specific Gravity, Urine: 1.017 (ref 1.005–1.030)

## 2012-10-26 ENCOUNTER — Other Ambulatory Visit: Payer: Self-pay | Admitting: Women's Health

## 2012-10-27 ENCOUNTER — Ambulatory Visit
Admission: RE | Admit: 2012-10-27 | Discharge: 2012-10-27 | Disposition: A | Discharge status: Home / Self Care | Payer: 59 | Source: Ambulatory Visit | Attending: Oncology | Admitting: Oncology

## 2012-10-27 DIAGNOSIS — Z1231 Encounter for screening mammogram for malignant neoplasm of breast: Secondary | ICD-10-CM

## 2012-11-07 ENCOUNTER — Ambulatory Visit: Payer: 59 | Admitting: Women's Health

## 2012-11-26 ENCOUNTER — Other Ambulatory Visit (HOSPITAL_BASED_OUTPATIENT_CLINIC_OR_DEPARTMENT_OTHER): Payer: 59 | Admitting: Lab

## 2012-11-26 ENCOUNTER — Telehealth: Payer: Self-pay | Admitting: Oncology

## 2012-11-26 ENCOUNTER — Encounter: Payer: Self-pay | Admitting: Oncology

## 2012-11-26 ENCOUNTER — Ambulatory Visit (HOSPITAL_BASED_OUTPATIENT_CLINIC_OR_DEPARTMENT_OTHER): Payer: 59 | Admitting: Oncology

## 2012-11-26 VITALS — BP 123/81 | HR 81 | Temp 97.5°F | Resp 20 | Ht 60.0 in | Wt 139.0 lb

## 2012-11-26 DIAGNOSIS — Z1231 Encounter for screening mammogram for malignant neoplasm of breast: Secondary | ICD-10-CM

## 2012-11-26 DIAGNOSIS — Z853 Personal history of malignant neoplasm of breast: Secondary | ICD-10-CM

## 2012-11-26 DIAGNOSIS — Z1501 Genetic susceptibility to malignant neoplasm of breast: Secondary | ICD-10-CM

## 2012-11-26 DIAGNOSIS — Z1509 Genetic susceptibility to other malignant neoplasm: Secondary | ICD-10-CM

## 2012-11-26 DIAGNOSIS — Z171 Estrogen receptor negative status [ER-]: Secondary | ICD-10-CM

## 2012-11-26 DIAGNOSIS — C50919 Malignant neoplasm of unspecified site of unspecified female breast: Secondary | ICD-10-CM

## 2012-11-26 DIAGNOSIS — G43909 Migraine, unspecified, not intractable, without status migrainosus: Secondary | ICD-10-CM

## 2012-11-26 LAB — CBC WITH DIFFERENTIAL/PLATELET
BASO%: 0.2 % (ref 0.0–2.0)
Basophils Absolute: 0 10*3/uL (ref 0.0–0.1)
EOS%: 0.9 % (ref 0.0–7.0)
MCH: 28.1 pg (ref 25.1–34.0)
MCHC: 33.7 g/dL (ref 31.5–36.0)
MCV: 83.3 fL (ref 79.5–101.0)
MONO%: 8.5 % (ref 0.0–14.0)
RBC: 4.01 10*6/uL (ref 3.70–5.45)
RDW: 15.3 % — ABNORMAL HIGH (ref 11.2–14.5)

## 2012-11-26 LAB — COMPREHENSIVE METABOLIC PANEL (CC13)
AST: 15 U/L (ref 5–34)
Albumin: 3.5 g/dL (ref 3.5–5.0)
Alkaline Phosphatase: 69 U/L (ref 40–150)
BUN: 14 mg/dL (ref 7.0–26.0)
Potassium: 3.5 mEq/L (ref 3.5–5.1)
Sodium: 140 mEq/L (ref 136–145)

## 2012-11-26 NOTE — Progress Notes (Signed)
OFFICE PROGRESS NOTE   11/26/2012   Physicians: V.Bland, J.Fernandez/ Maryelizabeth Rowan PA, G.[place], [facility]  INTERVAL HISTORY:   Patient is seen, alone for visit, in scheduled follow up of her history of breast cancer, with BRCA 2 abnormality.  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 The tumor was triple negative (ER/PR negative and HER 2 1+ ,CISH not done in 2002), treated with left partial mastectomy with axillary node evaluation by Dr.Streck, adjuvant chemotherapy with adriamycin/cytoxan x 4 followed by taxotere weekly x 12 by Dr. Lyndal Pulley, radiation to breast/axilla/supraclavicular region by Dr.Kinard, and since then on observation without known active disease. 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 Last bilateral mammograms were at [facility] 10-28-12 with breast tissue still extremely dense but no mammographic findings of concern; she has submuscular saline implants bilaterally. Last breast MRI was 11-14-11. With the BRCA 2 abnormality and extremely dense breast tissue, yearly MRI is medically necessary.WIth MRI, regular mammography rather than increased radiation with 3D/ tomo mammograms seems best given concern for radiation sensitivity in BRCA 2 mutations.   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 She has had no recent infectious illness, does not want flu vaccine, does understand to contact physician promptly if flu symptoms. She has had more frequent migraine headaches in past several months, up to 4-5x per month. She had been on verapamil from neurologist (?) at [facility] for the  HA, DCd as no longer helpful and BP too low. She would like [facility] to [facility] if [facility]. She is not aware of any changes on breast self exam. She continues with regular menstrual periods and is up to date on gyn exam, last 10-24-12. She denies pain, respiratory or GI symptoms. Remainder of 10 point Review of Systems negative.  Objective:  Vital signs in last 24 hours:  BP 123/81  Pulse 81  Temp 97.5 F (36.4 C) (Oral)  Resp 20  Ht 5' (1.524 m)  Wt 139 lb (63.05 kg)  BMI 27.15 kg/m2 Weight is up 4 lbs. Easily ambulatory, looks comfortable.  HEENT:PERRLA, sclera clear, anicteric and oropharynx clear, no lesions No excessive lacrimation obvious now. LymphaticsCervical, supraclavicular, and axillary nodes normal. Resp: clear to auscultation bilaterally and normal percussion bilaterally Cardio: regular rate and rhythm GI: soft, non-tender; bowel sounds normal; no masses,  no organomegaly Extremities: extremities normal, atraumatic, no cyanosis or edema Neuro:CN, motor, sensory, cerebellar nonfocal Breasts: Bilateral augmentation (submuscular saline implants). Left with no findings of concern at lumpectomy scar, no dominant mass or skin/ nipple findings and nothing left axilla. Right breast no dominant mass, no skin or nipple changes, axilla benign. Skin without rash or ecchymosis Lab Results:  Results for orders placed in visit on 11/26/12  CBC WITH DIFFERENTIAL      Component Value Range   WBC 7.5  3.9 - 10.3 10e3/uL   NEUT# 5.1  1.5 - 6.5 10e3/uL   HGB 11.3 (*) 11.6 - 15.9 g/dL   HCT 40.9 (*) 81.1 - 91.4 %   Platelets 271  145 - 400 10e3/uL   MCV 83.3  79.5 - 101.0 fL  MCH 28.1  25.1 - 34.0 pg   MCHC 33.7  31.5 - 36.0 g/dL   RBC 7.84  6.96 - 2.95 10e6/uL   RDW 15.3 (*) 11.2 - 14.5 %   lymph# 1.8  0.9 - 3.3 10e3/uL   MONO# 0.6  0.1 - 0.9 10e3/uL   Eosinophils Absolute 0.1  0.0 - 0.5 10e3/uL   Basophils Absolute 0.0  0.0 - 0.1 10e3/uL   NEUT% 67.0  38.4 - 76.8 %     LYMPH% 23.4  14.0 - 49.7 %   MONO% 8.5  0.0 - 14.0 %   EOS% 0.9  0.0 - 7.0 %   BASO% 0.2  0.0 - 2.0 %  COMPREHENSIVE METABOLIC PANEL (CC13)      Component Value Range   Sodium 140  136 - 145 mEq/L   Potassium 3.5  3.5 - 5.1 mEq/L   Chloride 103  98 - 107 mEq/L   CO2 28  22 - 29 mEq/L   Glucose 77  70 - 99 mg/dl   BUN 28.4  7.0 - 13.2 mg/dL   Creatinine 0.8  0.6 - 1.1 mg/dL   Total Bilirubin 4.40  0.20 - 1.20 mg/dL   Alkaline Phosphatase 69  40 - 150 U/L   AST 15  5 - 34 U/L   ALT 12  0 - 55 U/L   Total Protein 7.6  6.4 - 8.3 g/dL   Albumin 3.5  3.5 - 5.0 g/dL   Calcium 9.3  8.4 - 10.2 mg/dL     Studies/Results: [id number] DIGITAL SCREENING MAMMOGRAM WITH IMPLANTS AND CAD  The patient has submuscular saline implants. Standard and implant  displaced views were performed.  Comparison: Previous exams.  FINDINGS:  ACR Breast Density Category 4: The breast tissue is extremely  dense.  No suspicious masses, architectural distortion, or calcifications  are present. The left lumpectomy changes are noted. Evidence of to  benign right breast biopsies with clips in place.  Images were processed with CAD.  IMPRESSION:  No mammographic evidence of malignancy.  We will request breast MRI in ~ early March.  Medications: I have reviewed the patient's current medications.  Patient has brought information concerning BRCA testing available thru [employer] with employee discount. [facility] genetics counselor obtained information from [facility] concerning the previous testing and discussed with me after patient's visit today, which I then relayed to patient by phone. It seems reasonable to confirm the research finding thru additional testing now, possibly thru [employer] or other, and Ms.Lowell Guitar can assist that lab with prior information. Patient agrees to meet with Ms.Powell to go over recommendations.  Assessment/Plan: 1.Personal history of T2 N1 (9 node +) triple negative left breast carcinoma 2002:  clinically doing wellThe BRCA 2 truncation found on research testing in 2010 would be appropriate to recheck thru commercial lab with advances that have been made in interim, and she will discuss with genetics counselor. Continue yearly breast MRI in addition to mammograms. I will see her every 6 months due to BRCA 2 and other history. Note she has not been interested in prophylactic mastectomies or oophorectomy to this point. I do not know if family members have had any genetics testing. 2.increased frequency of migraine HA: otherwise unchanged from longstanding HA. [facility], as previous physician no longer at [facility] and she prefers to keep physicians in [place] now. 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

## 2012-11-26 NOTE — Patient Instructions (Signed)
Breast MRI within 3 months of mammograms, so before late March.   [facility] [phone]. My schedulers can set up appointment if you want  If flu symptoms, you should get Tamiflu

## 2012-11-26 NOTE — Telephone Encounter (Signed)
Medical Oncology  Discussed with [facility] genetics counselor Maylon Cos, who reviewed information from genetics testing done on the [facility] research protocol in 2010. She recommends confirming this research information and can help facilitate with information about the identified truncation to whichever lab patient prefers to use.   I spoke with patient by phone now, and she is willing to discuss with Maylon Cos. Request sent to scheduler.  Ila Mcgill, MD

## 2012-11-26 NOTE — Telephone Encounter (Signed)
gv and printed appt schedule for pt for Jan 2015 and Dec....schedule Mammo for Dec 29th @ 5:10pm...will call pt with d/t for [facility]..the patient aware central scheduling will call with d/t for MR

## 2012-11-27 ENCOUNTER — Telehealth: Payer: Self-pay | Admitting: Oncology

## 2012-11-27 ENCOUNTER — Other Ambulatory Visit: Payer: Self-pay | Admitting: Oncology

## 2012-11-27 DIAGNOSIS — C50919 Malignant neoplasm of unspecified site of unspecified female breast: Secondary | ICD-10-CM

## 2012-11-27 NOTE — Telephone Encounter (Signed)
s.w. pt and advised on July appt....pt ok and aware °

## 2012-11-27 NOTE — Telephone Encounter (Signed)
Faxed pt medical records to [facility].

## 2012-11-27 NOTE — Telephone Encounter (Signed)
s.w. pt and advised on march appts and S.w Pam at [facility] and they needed referral faxed.Marland KitchenMarland KitchenMarland KitchenGv referral to Selena Batten so the records could be sent over.

## 2012-12-03 ENCOUNTER — Encounter: Payer: Self-pay | Admitting: Women's Health

## 2012-12-16 ENCOUNTER — Ambulatory Visit (HOSPITAL_COMMUNITY)
Admission: RE | Admit: 2012-12-16 | Discharge: 2012-12-16 | Disposition: A | Discharge status: Home / Self Care | Payer: 59 | Source: Ambulatory Visit | Attending: Oncology | Admitting: Oncology

## 2012-12-16 ENCOUNTER — Other Ambulatory Visit: Payer: Self-pay | Admitting: Oncology

## 2012-12-16 DIAGNOSIS — Z1501 Genetic susceptibility to malignant neoplasm of breast: Secondary | ICD-10-CM

## 2012-12-16 DIAGNOSIS — D249 Benign neoplasm of unspecified breast: Secondary | ICD-10-CM | POA: Insufficient documentation

## 2012-12-16 DIAGNOSIS — Z853 Personal history of malignant neoplasm of breast: Secondary | ICD-10-CM | POA: Insufficient documentation

## 2012-12-16 DIAGNOSIS — Z1509 Genetic susceptibility to other malignant neoplasm: Secondary | ICD-10-CM

## 2012-12-16 MED ORDER — GADOBENATE DIMEGLUMINE 529 MG/ML IV SOLN
12.0000 mL | Freq: Once | INTRAVENOUS | Status: AC | PRN
  Administered 2012-12-16: 12 mL via INTRAVENOUS

## 2012-12-19 ENCOUNTER — Telehealth: Payer: Self-pay

## 2012-12-19 NOTE — Telephone Encounter (Signed)
Told Tara Bell the results of breast MRI as noted below by Dr. Darrold Span.

## 2012-12-19 NOTE — Telephone Encounter (Signed)
Message copied by Lorine Bears on Fri Dec 19, 2012 11:24 AM ------      Message from: Reece Packer      Created: Thu Dec 18, 2012  3:50 PM       Labs seen and need follow up: please let her know the breast MRI did not show anything of concern ------

## 2012-12-20 ENCOUNTER — Other Ambulatory Visit: Payer: Self-pay

## 2013-01-02 ENCOUNTER — Telehealth: Payer: Self-pay

## 2013-01-02 NOTE — Telephone Encounter (Signed)
Faxed office note and labs from 11-26-12 to Dr. Parke Simmers as patient requested.

## 2013-01-22 ENCOUNTER — Other Ambulatory Visit: Payer: 59 | Admitting: Lab

## 2013-01-22 ENCOUNTER — Ambulatory Visit (HOSPITAL_BASED_OUTPATIENT_CLINIC_OR_DEPARTMENT_OTHER): Payer: 59 | Admitting: Genetic Counselor

## 2013-01-22 DIAGNOSIS — C50919 Malignant neoplasm of unspecified site of unspecified female breast: Secondary | ICD-10-CM

## 2013-01-22 DIAGNOSIS — Z853 Personal history of malignant neoplasm of breast: Secondary | ICD-10-CM

## 2013-01-22 DIAGNOSIS — IMO0002 Reserved for concepts with insufficient information to code with codable children: Secondary | ICD-10-CM

## 2013-01-22 DIAGNOSIS — Z8249 Family history of ischemic heart disease and other diseases of the circulatory system: Secondary | ICD-10-CM

## 2013-01-23 ENCOUNTER — Encounter: Payer: Self-pay | Admitting: Genetic Counselor

## 2013-01-23 NOTE — Progress Notes (Signed)
Dr.  Jama Flavors requested a consultation for genetic counseling and risk assessment for Tara Bell, a 47 y.o. female, for discussion of her personal history of breast cancer and family history of stomach and prostate cancer. She presents to clinic today to discuss the possibility of a genetic predisposition to cancer, and to further clarify her risks, as well as her family members' risks for cancer.   HISTORY OF PRESENT ILLNESS: In 2003, at the age of 81, Tara Bell was diagnosed with triple negative breast cancer. This was treated with chemotherapy, lumpectomy and radiation.    Past Medical History  Diagnosis Date  . Lymphedema of arm     DUE TO LEFT   AXILLARY NODE DISSECTION   AND RADIATION  . 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 Migraines   . Breast cancer SEPTEMBER 2002    LEFT BREAST-SURGERY,CHEMO,RADIATION-DR.LIVESAY  . 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 BRCA1 negative 08/2011  . Hypertension     Past Surgical History  Procedure Laterality Date  . Bilateral tubal    . Tubal ligation      BILATERAL  . Mastectomy, partial  07/23/2001    LEFT BREAST WITH AXILLARY NODE EVALUATION  . Hysteroscopy  12/10/07    RESECTOSCOPIC POLYPECTOMY/MYOMECTOMY  . Breast lumpectomy      AE 36  . Augmentation mammaplasty  2008    History  Substance Use Topics  . Smoking status: Never Smoker   . Smokeless tobacco: Not on file  . Alcohol Use: No    REPRODUCTIVE HISTORY AND PERSONAL RISK ASSESSMENT FACTORS: Menarche was at age 64.   [place] Uterus Intact: Yes Ovaries Intact: Yes G2P2A0 , first live birth at age 72  She has not previously undergone treatment for infertility.   OCP use for 8 years   She has not used HRT in the past.    FAMILY HISTORY:  We obtained a detailed, 4-generation family history.  Significant diagnoses are listed below: Family History  Problem Relation Age of Onset  .  Hypertension Mother   . Heart disease Mother   . Hypertension Father   . Prostate cancer Father 86  . Hypertension Sister   . Hypertension Sister   . Cancer Maternal Aunt     unknown type  . Stomach cancer Paternal Uncle   . Leukemia Paternal Uncle   The patient was diagnosed with triple negative breast cancer at age 42.  She was tested through the [facility] BRCA research protocol and was found to have a BRCA2 VUS that truncated the protein.  The patient has two brothers and four sisters, all full siblings, none of whom had cancer. Her father was diagnosed with prostate cancer at age 24.  He had three sisters and two brothers.  One brother had leukemia in his 37s-60s and the other brother, who was a smoker, had stomach cancer in his 70s-60s.  The patients mother died of a heart attack at age 21.  She had three full brothers and four full sisters.  One sister died of an unknown cancer.  There is no other reported cancer history.  Patient's maternal ancestors are of [place] American descent, and paternal ancestors are of [place] descent. There is no reported Ashkenazi Jewish ancestry. There is no  known consanguinity.  GENETIC COUNSELING RISK ASSESSMENT, DISCUSSION, AND SUGGESTED FOLLOW UP: We reviewed the natural history and genetic etiology of  sporadic, familial and hereditary cancer syndromes.  About 5-10% of breast cancer is hereditary.  Of this, about 85% is the result of a BRCA1 or BRCA2 mutation.  We reviewed the red flags of hereditary cancer syndromes and the dominant inheritance patterns. The patient works at [employer] and wanted to be tested through them for BRCA mutations.  We discussed the pros and cons of testing through [facility] and through other labs, including experience performing the test, knowledge base of variants, as well as logistics of testing since the Cancer Center does not have a contract with [facility].  We agreed that we would do testing through another  company because of their experience with variants.  If she learns that she has a high out of pocket cost, we will reconsider testing through [facility].  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 If a mutation indicating HBOC is detected in this case, the [employer] recommendations would include increased cancer surveillance and possible prophylactic surgery. If a mutation is detected, the patient will be referred back to the referring provider and to any additional appropriate care providers to discuss the relevant options.   If a mutation is not found in the patient, this will decrease the likelihood of HBOC as the explanation for her breast cancer. Cancer surveillance options would be discussed for the patient according to the appropriate standard National Comprehensive Cancer Network and American Cancer Society guidelines, with consideration of their personal and family history risk factors. In this case, the patient will be referred back to their care providers for discussions of management.   After considering the risks, benefits, and limitations, the patient provided informed consent for  the following  testing: Single site testing, reflexing to [facility] through [facility].   Per the patient's request, we will contact her by telephone to discuss these results. A follow up genetic counseling visit will be scheduled if indicated.  The patient was seen for a total of 60 minutes, greater than 50% of which was spent face-to-face counseling.  This plan is being carried out per Dr. Jama Flavors recommendations.  This note will also be sent to the referring provider via the electronic medical  record. The patient will be supplied with a summary of this genetic counseling discussion as well as educational information on the discussed hereditary cancer syndromes following the conclusion of their visit.   Patient was discussed with Dr. Drue Second.   _______________________________________________________________________ For Office Staff:  Number of people involved in session: 1 Was an Intern/ student involved with case: no }

## 2013-02-04 ENCOUNTER — Other Ambulatory Visit: Payer: Self-pay | Admitting: Women's Health

## 2013-02-18 ENCOUNTER — Telehealth: Payer: Self-pay | Admitting: Genetic Counselor

## 2013-02-18 NOTE — Telephone Encounter (Signed)
Revealed negative test results, but that the BRCA2 VUS that was found in her in 2010 through [facility] research, was also found, along with a PTEN VUS and TP53 VUS.  At this time, the PTEN VUS and TP53 VUS are looking as if they will be benign polymorphisms.  The BRCA2 VUS has been seen by [facility] 4 times.  When I discussed with them that this was found b/c the protein was truncated (shortened), and that it seems to affect the stop codon, they said that they would relook at this and get back to me about whether they want to do further studies.

## 2013-02-24 ENCOUNTER — Telehealth: Payer: Self-pay | Admitting: Genetic Counselor

## 2013-02-24 NOTE — Telephone Encounter (Signed)
Notified patient that [facility] did not want to look further into the protein truncation issue, as they felt that the information they have indicates that the stop codon is not affected.  They will not reclassify the variant at this time.  I spoke with [facility] and they have not seen the variant in their population.  Ophelia Shoulder emailed me and indicated that if she has any OOP cost for the test, that we should contact her Barth Kirks) and let her know before the patient pays for testing, as she will try to get it reduced.

## 2013-02-25 ENCOUNTER — Encounter: Payer: Self-pay | Admitting: Genetic Counselor

## 2013-02-27 ENCOUNTER — Ambulatory Visit: Payer: Self-pay | Admitting: Nurse Practitioner

## 2013-04-13 ENCOUNTER — Telehealth: Payer: Self-pay | Admitting: Oncology

## 2013-04-13 NOTE — Telephone Encounter (Signed)
Pt called and gave pt appt for July 2014

## 2013-04-17 ENCOUNTER — Telehealth: Payer: Self-pay | Admitting: Oncology

## 2013-04-17 NOTE — Telephone Encounter (Signed)
mvoed 7/8 appt to covering provider due to [facility]'s departure. S/w pt re new time for lb/fu 7/8 @ 12:30 pm. Pt expressed concerns about a female provider and would like a female. Pt given Jeff's number to contact him and I also gv message to [place].

## 2013-04-28 ENCOUNTER — Telehealth: Payer: Self-pay | Admitting: Oncology

## 2013-05-05 ENCOUNTER — Telehealth: Payer: Self-pay | Admitting: *Deleted

## 2013-05-05 NOTE — Telephone Encounter (Signed)
Called pt back, she had question about being seen every 6 months VS 1 year. Dr Precious Reel last note indicates 6 mos "due to BRCA 2 and other history". PT to see Dr Welton Flakes on 05/12/13

## 2013-05-12 ENCOUNTER — Other Ambulatory Visit (HOSPITAL_BASED_OUTPATIENT_CLINIC_OR_DEPARTMENT_OTHER): Payer: 59 | Admitting: Lab

## 2013-05-12 ENCOUNTER — Ambulatory Visit (HOSPITAL_BASED_OUTPATIENT_CLINIC_OR_DEPARTMENT_OTHER): Payer: 59 | Admitting: Oncology

## 2013-05-12 ENCOUNTER — Encounter: Payer: Self-pay | Admitting: Oncology

## 2013-05-12 ENCOUNTER — Ambulatory Visit: Payer: 59

## 2013-05-12 ENCOUNTER — Other Ambulatory Visit: Payer: 59 | Admitting: Lab

## 2013-05-12 ENCOUNTER — Telehealth: Payer: Self-pay | Admitting: *Deleted

## 2013-05-12 VITALS — BP 129/80 | HR 74 | Temp 98.2°F | Resp 20 | Ht 60.0 in | Wt 140.3 lb

## 2013-05-12 DIAGNOSIS — C50912 Malignant neoplasm of unspecified site of left female breast: Secondary | ICD-10-CM

## 2013-05-12 DIAGNOSIS — Z1501 Genetic susceptibility to malignant neoplasm of breast: Secondary | ICD-10-CM

## 2013-05-12 DIAGNOSIS — C50919 Malignant neoplasm of unspecified site of unspecified female breast: Secondary | ICD-10-CM

## 2013-05-12 LAB — COMPREHENSIVE METABOLIC PANEL (CC13)
ALT: 12 U/L (ref 0–55)
Albumin: 3.5 g/dL (ref 3.5–5.0)
CO2: 25 mEq/L (ref 22–29)
Calcium: 9.2 mg/dL (ref 8.4–10.4)
Chloride: 106 mEq/L (ref 98–109)
Glucose: 89 mg/dl (ref 70–140)
Potassium: 3.8 mEq/L (ref 3.5–5.1)
Sodium: 138 mEq/L (ref 136–145)
Total Protein: 7.7 g/dL (ref 6.4–8.3)

## 2013-05-12 LAB — CBC WITH DIFFERENTIAL/PLATELET
Eosinophils Absolute: 0 10*3/uL (ref 0.0–0.5)
LYMPH%: 18.4 % (ref 14.0–49.7)
MONO#: 0.6 10*3/uL (ref 0.1–0.9)
NEUT#: 5.8 10*3/uL (ref 1.5–6.5)
Platelets: 332 10*3/uL (ref 145–400)
RBC: 4.18 10*6/uL (ref 3.70–5.45)
RDW: 16.3 % — ABNORMAL HIGH (ref 11.2–14.5)
WBC: 7.9 10*3/uL (ref 3.9–10.3)
lymph#: 1.5 10*3/uL (ref 0.9–3.3)

## 2013-05-12 NOTE — Telephone Encounter (Signed)
appts made and printed...td 

## 2013-05-12 NOTE — Progress Notes (Signed)
OFFICE PROGRESS NOTE  CC  Tara Pitter, MD [address] [place] [address]  DIAGNOSIS: 47 year old female with history of stage III invasive ductal carcinoma of the breast diagnosed in 2002.  PRIOR THERAPY:  #1 at the age of 3 in September 2002 patient was diagnosed with a 2.5 cm grade 3 invasive ductal carcinoma of the left breast with 9 of 14 lymph nodes positive for metastatic disease. Tumor was triple-negative (ER PR negative and HER-2/neu 1+). Patient underwent a left partial mastectomy with axillary lymph node dissection by Dr. Jamey Ripa initially.  #2 patient then went on to have adjuvant chemotherapy with 4 cycles of Adriamycin and Cytoxan followed by 12 weeks of weekly Taxotere by Dr. Catha Gosselin..patient's treatment course was complicated by development of lacrimal duct stenosis from the Taxotere  #3 she then received radiation therapy to the breast axilla and supraclavicular region by Dr. Antony Blackbird..  #4 patient also had genetic testing performed at [facility] on a research protocol in 2010 and patient was found to have truncation of BRCA2.  #5 patient has been followed by Dr. Darrold Span She is now transferring her care me for ongoing follow up  CURRENT THERAPY:observation  INTERVAL HISTORY: Tara Bell 47 y.o. female returns for followup visit and to establish care. Clinically patient seems to be doing well. She denies any fevers chills night sweats headaches shortness of breath chest pains or palpitations no nausea or vomiting. Patient does get migraine headaches she does see a neurologist at [facility] periodically. Remainder of the 10 point review of systems is negative.  MEDICAL HISTORY: Past Medical History  Diagnosis Date  . Lymphedema of arm     DUE TO LEFT   AXILLARY NODE DISSECTION   AND RADIATION  . 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 Migraines   . Breast cancer SEPTEMBER 2002    LEFT BREAST-SURGERY,CHEMO,RADIATION-DR.LIVESAY   . 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 BRCA1 negative 08/2011  . Hypertension     ALLERGIES:  is allergic to nitrofurantoin monohyd macro.  MEDICATIONS:  Current Outpatient Prescriptions  Medication Sig Dispense Refill  . butalbital-aspirin-caffeine (FIORINAL) 50-325-40 MG per capsule Take 1 capsule by mouth 2 (two) times daily as needed.       Marland Kitchen ibuprofen (ADVIL,MOTRIN) 600 MG tablet TAKE ONE TABLET BY MOUTH EVERY 6 HOURS AS NEEDED FOR PAIN  60 tablet  1  . Olmesartan-Amlodipine-HCTZ (TRIBENZOR) 40-10-25 MG TABS Take 1 tablet by mouth daily.        . clobetasol cream (TEMOVATE) 0.05 %       . Fe Fum-FePoly-Vit C-Vit B3 (INTEGRA) 62.5-62.5-40-3 MG CAPS TAKE 1 TABLET DAILY FOR LOW IRON. TAKE ON EMPTY STOMACH WITH OJ IF POSSIBLE  30 capsule  5  . magnesium 30 MG tablet Take 30 mg by mouth 2 (two) times daily.      . metroNIDAZOLE (METROGEL VAGINAL) 0.75 % vaginal gel 1 applicator per vagina at HS x 5  70 g  1  . SUMAtriptan (IMITREX) 50 MG tablet       . valACYclovir (VALTREX) 500 MG tablet Take 1 tablet (500 mg total) by mouth 2 (two) times daily.  30 tablet  1   No current facility-administered medications for this visit.    SURGICAL HISTORY:  Past Surgical History  Procedure Laterality Date  . Bilateral tubal    . Tubal ligation      BILATERAL  .  Mastectomy, partial  07/23/2001    LEFT BREAST WITH AXILLARY NODE EVALUATION  . Hysteroscopy  12/10/07    RESECTOSCOPIC POLYPECTOMY/MYOMECTOMY  . Breast lumpectomy      AE 36  . Augmentation mammaplasty  2008    REVIEW OF SYSTEMS:  Pertinent items are noted in HPI.   HEALTH MAINTENANCE:   PHYSICAL EXAMINATION: Blood pressure 129/80, pulse 74, temperature 98.2 F (36.8 C), temperature source Oral, resp. rate 20, height 5' (1.524 m), weight 140 lb 4.8 oz (63.64 kg). Body mass index is 27.4 kg/(m^2). ECOG PERFORMANCE STATUS: 0 - Asymptomatic   General appearance: alert, cooperative and appears stated  age Lymph nodes: Cervical, supraclavicular, and axillary nodes normal. Resp: clear to auscultation bilaterally Cardio: regular rate and rhythm GI: soft, non-tender; bowel sounds normal; no masses,  no organomegaly Extremities: extremities normal, atraumatic, no cyanosis or edema Neurologic: Grossly normal Bilateral breast examination: Bilateral augmentation with submuscular saline implants. Left breast no findings of recurrence could lumpectomy scar no masses nipple discharge. Right breast no masses or nipple discharge there  LABORATORY DATA: Lab Results  Component Value Date   WBC 7.9 05/12/2013   HGB 11.1* 05/12/2013   HCT 33.9* 05/12/2013   MCV 81.1 05/12/2013   PLT 332 05/12/2013      Chemistry      Component Value Date/Time   NA 138 05/12/2013 0951   NA 138 11/27/2011 1136   K 3.8 05/12/2013 0951   K 3.9 11/27/2011 1136   CL 103 11/26/2012 1054   CL 102 11/27/2011 1136   CO2 25 05/12/2013 0951   CO2 22 11/27/2011 1136   BUN 8.6 05/12/2013 0951   BUN 12 11/27/2011 1136   CREATININE 0.8 05/12/2013 0951   CREATININE 0.84 11/27/2011 1136      Component Value Date/Time   CALCIUM 9.2 05/12/2013 0951   CALCIUM 9.2 11/27/2011 1136   ALKPHOS 64 05/12/2013 0951   ALKPHOS 54 11/27/2011 1136   AST 19 05/12/2013 0951   AST 19 11/27/2011 1136   ALT 12 05/12/2013 0951   ALT 13 11/27/2011 1136   BILITOT 0.31 05/12/2013 0951   BILITOT 0.3 11/27/2011 1136       RADIOGRAPHIC STUDIES:  No results found.  ASSESSMENT: 64 [place] female with  #1 history of T2 N1 (9 positive lymph nodes) invasive ductal carcinoma of the left breast diagnosed 2002. 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 She has not had any ovarian malignancies or malignancies in the contralateral breast. Patient is getting MRIs on a yearly basis as well as mammograms.     PLAN:   #1 patient and I discussed her history in detail today. We discussed the  significance of having a triple-negative breast cancer diagnosed in 2002. We discussed the significance of being BRCA2 mutation positive.  #2 she will continue yearly breast MRIs as well as mammograms.  #3 at this time I do think that she can be seen on a yearly basis   All questions were answered. The patient knows to call the clinic with any problems, questions or concerns. We can certainly see the patient much sooner if necessary.  I spent 40 minutes counseling the patient face to face. The total time spent in the appointment was 30 minutes.    Drue Second, MD Medical/Oncology [facility] [phone] (beeper) [phone] (Office)  05/12/2013, 11:43 AM

## 2013-05-12 NOTE — Patient Instructions (Addendum)
#  1 I will continue to follow you every 6-12 months time.  #2 you're scheduled to have a mammogram performed in December 2014. I will plan on seeing you back after that which will be in January 2015.  #3 we discussed the possibility of you having bilateral ovaries removed. We will discuss this further at your next visit. I will also clarify your genetic status with our genetic counselor Maylon Cos.

## 2013-06-30 ENCOUNTER — Inpatient Hospital Stay (HOSPITAL_COMMUNITY)
Admission: EM | Admit: 2013-06-30 | Discharge: 2013-07-02 | DRG: 282 | Disposition: A | Discharge status: Home / Self Care | Payer: 59 | Attending: Cardiovascular Disease | Admitting: Cardiovascular Disease

## 2013-06-30 ENCOUNTER — Encounter (HOSPITAL_COMMUNITY): Payer: Self-pay | Admitting: *Deleted

## 2013-06-30 ENCOUNTER — Emergency Department (HOSPITAL_COMMUNITY): Payer: 59

## 2013-06-30 DIAGNOSIS — Z853 Personal history of malignant neoplasm of breast: Secondary | ICD-10-CM

## 2013-06-30 DIAGNOSIS — Z800 Family history of malignant neoplasm of digestive organs: Secondary | ICD-10-CM

## 2013-06-30 DIAGNOSIS — I214 Non-ST elevation (NSTEMI) myocardial infarction: Secondary | ICD-10-CM | POA: Diagnosis present

## 2013-06-30 DIAGNOSIS — Z9851 Tubal ligation status: Secondary | ICD-10-CM

## 2013-06-30 DIAGNOSIS — Z7982 Long term (current) use of aspirin: Secondary | ICD-10-CM

## 2013-06-30 DIAGNOSIS — Z79899 Other long term (current) drug therapy: Secondary | ICD-10-CM

## 2013-06-30 DIAGNOSIS — Z8249 Family history of ischemic heart disease and other diseases of the circulatory system: Secondary | ICD-10-CM

## 2013-06-30 DIAGNOSIS — I10 Essential (primary) hypertension: Secondary | ICD-10-CM | POA: Diagnosis present

## 2013-06-30 DIAGNOSIS — Z9010 Acquired absence of unspecified breast and nipple: Secondary | ICD-10-CM

## 2013-06-30 DIAGNOSIS — Z806 Family history of leukemia: Secondary | ICD-10-CM

## 2013-06-30 DIAGNOSIS — R079 Chest pain, unspecified: Secondary | ICD-10-CM | POA: Diagnosis present

## 2013-06-30 DIAGNOSIS — Z8042 Family history of malignant neoplasm of prostate: Secondary | ICD-10-CM

## 2013-06-30 LAB — CBC
HCT: 33 % — ABNORMAL LOW (ref 36.0–46.0)
Hemoglobin: 11 g/dL — ABNORMAL LOW (ref 12.0–15.0)
MCH: 26.4 pg (ref 26.0–34.0)
MCHC: 33.3 g/dL (ref 30.0–36.0)
MCV: 79.3 fL (ref 78.0–100.0)
Platelets: 224 10*3/uL (ref 150–400)
RBC: 4.16 MIL/uL (ref 3.87–5.11)
RDW: 16.3 % — ABNORMAL HIGH (ref 11.5–15.5)
WBC: 12.4 10*3/uL — ABNORMAL HIGH (ref 4.0–10.5)

## 2013-06-30 LAB — BASIC METABOLIC PANEL
BUN: 9 mg/dL (ref 6–23)
CO2: 25 mEq/L (ref 19–32)
Calcium: 8.8 mg/dL (ref 8.4–10.5)
Chloride: 104 mEq/L (ref 96–112)
Creatinine, Ser: 0.72 mg/dL (ref 0.50–1.10)
GFR calc Af Amer: >90 mL/min (ref >90)
GFR calc non Af Amer: >90 mL/min (ref >90)
Glucose, Bld: 93 mg/dL (ref 70–99)
Potassium: 3.4 mEq/L — ABNORMAL LOW (ref 3.5–5.1)
Sodium: 138 mEq/L (ref 135–145)

## 2013-06-30 LAB — POCT I-STAT TROPONIN I: Troponin i, poc: 0.11 ng/mL (ref 0.00–0.08)

## 2013-06-30 LAB — D-DIMER, QUANTITATIVE: D-Dimer, Quant: 1.86 ug/mL-FEU — ABNORMAL HIGH (ref 0.00–0.48)

## 2013-06-30 LAB — TROPONIN I
Troponin I: 0.37 ng/mL (ref <0.30)
Troponin I: <0.3 ng/mL (ref <0.30)

## 2013-06-30 MED ORDER — ACETAMINOPHEN 325 MG PO TABS: 650.0000 mg | ORAL_TABLET | ORAL | Status: DC | PRN

## 2013-06-30 MED ORDER — SODIUM CHLORIDE 0.9 % IJ SOLN: 3.0000 mL | INTRAMUSCULAR | Status: DC | PRN

## 2013-06-30 MED ORDER — SODIUM CHLORIDE 0.9 % IV SOLN
1.0000 mL/kg/h | INTRAVENOUS | Status: DC
  Administered 2013-07-01: 1 mL/kg/h via INTRAVENOUS

## 2013-06-30 MED ORDER — ATORVASTATIN CALCIUM 20 MG PO TABS
20.0000 mg | ORAL_TABLET | Freq: Every day | ORAL | Status: DC
  Administered 2013-07-01: 22:00:00 20 mg via ORAL
  Filled 2013-06-30 (×3): qty 1

## 2013-06-30 MED ORDER — KETOROLAC TROMETHAMINE 30 MG/ML IJ SOLN
30.0000 mg | Freq: Once | INTRAMUSCULAR | Status: AC
  Administered 2013-06-30: 30 mg via INTRAVENOUS
  Filled 2013-06-30: qty 1

## 2013-06-30 MED ORDER — SODIUM CHLORIDE 0.9 % IJ SOLN
3.0000 mL | Freq: Two times a day (BID) | INTRAMUSCULAR | Status: DC
  Administered 2013-06-30: 3 mL via INTRAVENOUS

## 2013-06-30 MED ORDER — ASPIRIN EC 81 MG PO TBEC
81.0000 mg | DELAYED_RELEASE_TABLET | Freq: Every day | ORAL | Status: DC
  Administered 2013-07-02: 10:00:00 81 mg via ORAL
  Filled 2013-06-30 (×2): qty 1

## 2013-06-30 MED ORDER — METOPROLOL TARTRATE 12.5 MG HALF TABLET
12.5000 mg | ORAL_TABLET | Freq: Two times a day (BID) | ORAL | Status: DC
  Administered 2013-06-30 – 2013-07-02 (×3): 12.5 mg via ORAL
  Filled 2013-06-30 (×6): qty 1

## 2013-06-30 MED ORDER — ASPIRIN 81 MG PO CHEW: 324.0000 mg | CHEWABLE_TABLET | ORAL | Status: DC

## 2013-06-30 MED ORDER — NITROGLYCERIN 0.4 MG SL SUBL: 0.4000 mg | SUBLINGUAL_TABLET | SUBLINGUAL | Status: DC | PRN

## 2013-06-30 MED ORDER — ASPIRIN 81 MG PO CHEW
324.0000 mg | CHEWABLE_TABLET | Freq: Once | ORAL | Status: AC
  Administered 2013-06-30: 324 mg via ORAL
  Filled 2013-06-30: qty 4

## 2013-06-30 MED ORDER — ONDANSETRON HCL 4 MG/2ML IJ SOLN: 4.0000 mg | Freq: Four times a day (QID) | INTRAMUSCULAR | Status: DC | PRN

## 2013-06-30 MED ORDER — SODIUM CHLORIDE 0.9 % IV SOLN: 250.0000 mL | INTRAVENOUS | Status: DC | PRN

## 2013-06-30 MED ORDER — ENOXAPARIN SODIUM 60 MG/0.6ML ~~LOC~~ SOLN
60.0000 mg | Freq: Two times a day (BID) | SUBCUTANEOUS | Status: DC
  Administered 2013-06-30: 60 mg via SUBCUTANEOUS
  Filled 2013-06-30 (×3): qty 0.6

## 2013-06-30 MED ORDER — HEPARIN BOLUS VIA INFUSION
3000.0000 [IU] | Freq: Once | INTRAVENOUS | Status: AC
  Administered 2013-06-30: 3000 [IU] via INTRAVENOUS

## 2013-06-30 MED ORDER — METOCLOPRAMIDE HCL 5 MG/ML IJ SOLN
10.0000 mg | Freq: Once | INTRAMUSCULAR | Status: AC
  Administered 2013-06-30: 10 mg via INTRAVENOUS
  Filled 2013-06-30: qty 2

## 2013-06-30 MED ORDER — IOHEXOL 350 MG/ML SOLN
100.0000 mL | Freq: Once | INTRAVENOUS | Status: AC | PRN
  Administered 2013-06-30: 55 mL via INTRAVENOUS

## 2013-06-30 MED ORDER — ASPIRIN 300 MG RE SUPP
300.0000 mg | RECTAL | Status: DC
  Filled 2013-06-30: qty 1

## 2013-06-30 MED ORDER — TOPIRAMATE 25 MG PO TABS
25.0000 mg | ORAL_TABLET | Freq: Every day | ORAL | Status: DC
  Administered 2013-06-30: 25 mg via ORAL
  Filled 2013-06-30 (×4): qty 1

## 2013-06-30 MED ORDER — HEPARIN (PORCINE) IN NACL 100-0.45 UNIT/ML-% IJ SOLN
950.0000 [IU]/h | INTRAMUSCULAR | Status: DC
  Administered 2013-06-30: 950 [IU]/h via INTRAVENOUS
  Filled 2013-06-30: qty 250

## 2013-06-30 MED ORDER — ONDANSETRON HCL 4 MG/2ML IJ SOLN
4.0000 mg | Freq: Once | INTRAMUSCULAR | Status: DC
  Filled 2013-06-30: qty 2

## 2013-06-30 NOTE — Progress Notes (Signed)
ANTICOAGULATION CONSULT NOTE - Initial Consult  Pharmacy Consult for heparin Indication: chest pain/ACS  Allergies  Allergen Reactions  . Nitrofurantoin Monohyd Macro Nausea And Vomiting    Patient Measurements:  Dosing Weight: 63.6 kg  Vital Signs: Temp: 98.5 F (36.9 C) (08/26 0857) Temp src: Oral (08/26 0857) BP: 107/59 mmHg (08/26 1130) Pulse Rate: 103 (08/26 0857)  Labs:  Recent Labs  06/30/13 0952  HGB 11.0*  HCT 33.0*  PLT 224  CREATININE 0.72  TROPONINI 0.37*    The CrCl is unknown because both a height and weight (above a minimum accepted value) are required for this calculation.   Medical History: Past Medical History  Diagnosis Date  . Lymphedema of arm     DUE TO LEFT   AXILLARY NODE DISSECTION   AND RADIATION  . 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 Migraines   . Breast cancer SEPTEMBER 2002    LEFT BREAST-SURGERY,CHEMO,RADIATION-DR.LIVESAY  . 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 BRCA1 negative 08/2011  . Hypertension     Medications:   (Not in a hospital admission)  Assessment: 47 yo lady to start heparin for CP.  Initial troponin .37  Baseline Hg 11.0, PTLC 224 Goal of Therapy:  Heparin level 0.3-0.7 units/ml Monitor platelets by anticoagulation protocol: Yes   Plan:  Heparin bolus 3000 units and drip at 950 units/hr Check heparin level 6 hours after start and daily while on heparin. Check CBC daily.  Ceriah Kohler Bell 06/30/2013,11:40 AM

## 2013-06-30 NOTE — H&P (Signed)
Tara Bell is an 47 y.o. female.   Chief Complaint: Chest pain HPI:   Patient is a 47 year old female with a history left breast cancer with radiation therapy, migraines, hypertension.  Her mom died in her 74s from heart attack.  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  At its worst it was 9/10 in intensity with radiation to the middle of her back and left arm. She also reports nausea and shortness of breath but no diaphoresis.  She is currently pain-free.  She has had no prior chest tightness however she does report some headaches and dizzy spells since this past Thursday.  She currently denies vomiting, fever, orthopnea, dizziness, PND, cough, congestion, abdominal pain, hematochezia, melena, lower extremity edema.  Initial troponin is positive at 0.37.    Medications: Prior to Admission medications   Medication Sig Start Date End Date Taking? Authorizing Provider  baclofen (LIORESAL) 10 MG tablet Take 10 mg by mouth 3 (three) times daily as needed. Migraine. Limit to 2 headaches per week.   Yes Historical Provider, MD  topiramate (TOPAMAX) 25 MG tablet Take 25 mg by mouth at bedtime. New medication suppose to start today.   Yes Historical Provider, MD      Past Medical History  Diagnosis Date  . Lymphedema of arm     DUE TO LEFT   AXILLARY NODE DISSECTION   AND RADIATION  . 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 Migraines   . Breast cancer SEPTEMBER 2002    LEFT BREAST-SURGERY,CHEMO,RADIATION-DR.LIVESAY  . 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 BRCA1 negative 08/2011  . Hypertension     Past Surgical History  Procedure Laterality Date  . Bilateral tubal    . Tubal ligation      BILATERAL  . Mastectomy, partial  07/23/2001    LEFT BREAST WITH AXILLARY NODE EVALUATION  . Hysteroscopy  12/10/07     RESECTOSCOPIC POLYPECTOMY/MYOMECTOMY  . Breast lumpectomy      AE 36  . Augmentation mammaplasty  2008    Family History  Problem Relation Age of Onset  . Hypertension Mother   . Heart disease Mother   . Hypertension Father   . Prostate cancer Father 57  . Hypertension Sister   . Hypertension Sister   . Cancer Maternal Aunt     unknown type  . Stomach cancer Paternal Uncle   . Leukemia Paternal Uncle    Social History:  reports that she has never smoked. She does not have any smokeless tobacco history on file. She reports that she does not drink alcohol or use illicit drugs.  Allergies:  Allergies  Allergen Reactions  . Nitrofurantoin Monohyd Macro Nausea And Vomiting     (Not in a hospital admission)  Results for orders placed during the hospital encounter of 06/30/13 (from the past 48 hour(s))  CBC     Status: Abnormal   Collection Time    06/30/13  9:52 AM      Result Value Range   WBC 12.4 (*) 4.0 - 10.5 K/uL   RBC 4.16  3.87 - 5.11 MIL/uL   Hemoglobin 11.0 (*) 12.0 - 15.0 g/dL   HCT 40.9 (*) 81.1 - 91.4 %   MCV 79.3  78.0 - 100.0 fL   MCH 26.4  26.0 - 34.0  pg   MCHC 33.3  30.0 - 36.0 g/dL   RDW 40.9 (*) 81.1 - 91.4 %   Platelets 224  150 - 400 K/uL  BASIC METABOLIC PANEL     Status: Abnormal   Collection Time    06/30/13  9:52 AM      Result Value Range   Sodium 138  135 - 145 mEq/L   Potassium 3.4 (*) 3.5 - 5.1 mEq/L   Chloride 104  96 - 112 mEq/L   CO2 25  19 - 32 mEq/L   Glucose, Bld 93  70 - 99 mg/dL   BUN 9  6 - 23 mg/dL   Creatinine, Ser 7.82  0.50 - 1.10 mg/dL   Calcium 8.8  8.4 - 95.6 mg/dL   GFR calc non Af Amer >90  >90 mL/min   GFR calc Af Amer >90  >90 mL/min   Comment: (NOTE)     The eGFR has been calculated using the CKD EPI equation.     This calculation has not been validated in all clinical situations.     eGFR's persistently <90 mL/min signify possible Chronic Kidney     Disease.  D-DIMER, QUANTITATIVE     Status: Abnormal    Collection Time    06/30/13  9:52 AM      Result Value Range   D-Dimer, Quant 1.86 (*) 0.00 - 0.48 ug/mL-FEU   Comment:            AT THE INHOUSE ESTABLISHED CUTOFF     VALUE OF 0.48 ug/mL FEU,     THIS ASSAY HAS BEEN DOCUMENTED     IN THE LITERATURE TO HAVE     A SENSITIVITY AND NEGATIVE     PREDICTIVE VALUE OF AT LEAST     98 TO 99%.  THE TEST RESULT     SHOULD BE CORRELATED WITH     AN ASSESSMENT OF THE CLINICAL     PROBABILITY OF DVT / VTE.  TROPONIN I     Status: Abnormal   Collection Time    06/30/13  9:52 AM      Result Value Range   Troponin I 0.37 (*) <0.30 ng/mL   Comment:            Due to the release kinetics of cTnI,     a negative result within the first hours     of the onset of symptoms does not rule out     myocardial infarction with certainty.     If myocardial infarction is still suspected,     repeat the test at appropriate intervals.     REPEATED TO VERIFY     CRITICAL RESULT CALLED TO, READ BACK BY AND VERIFIED WITH:     Ranell Patrick (RN) [id number] 06/30/2013 L. LOMAX  POCT I-STAT TROPONIN I     Status: Abnormal   Collection Time    06/30/13 10:01 AM      Result Value Range   Troponin i, poc 0.11 (*) 0.00 - 0.08 ng/mL   Comment NOTIFIED PHYSICIAN     Comment 3            Comment: Due to the release kinetics of cTnI,     a negative result within the first hours     of the onset of symptoms does not rule out     myocardial infarction with certainty.     If myocardial infarction is still suspected,     repeat the test  at appropriate intervals.   Dg Chest 2 View  06/30/2013   CLINICAL DATA:  Chest and back pain. Slight shortness Breath.  EXAM: CHEST  2 VIEW  COMPARISON:  None.  FINDINGS: The heart size and mediastinal contours are within normal limits. Both lungs are clear. The visualized skeletal structures are unremarkable.  IMPRESSION: No active cardiopulmonary disease.   Electronically Signed   By: Charlett Nose   On: 06/30/2013 09:28   Ct Angio Chest Pe  W/cm &/or Wo Cm  06/30/2013   CLINICAL DATA:  Chest pain.  EXAM: CT ANGIOGRAPHY CHEST WITH CONTRAST  TECHNIQUE: Multidetector CT imaging of the chest was performed using the standard protocol during bolus administration of intravenous contrast. Multiplanar CT image reconstructions including MIPs were obtained to evaluate the vascular anatomy.  CONTRAST:  55mL OMNIPAQUE IOHEXOL 350 MG/ML SOLN  COMPARISON:  None  FINDINGS: No filling defects in the pulmonary arteries to suggest pulmonary emboli. Lungs are clear. No focal airspace opacitiesor suspicious nodules. No effusions. Insert Heart No mediastinal, hilar, or axillary adenopathy. Visualized thyroid and chest wall soft tissues unremarkable. Bilateral breast implants noted.  Imaging into the upper abdomen shows no acute findings. Low-density lesion in the left hepatic lobe measuring 19 mm, most likely a small cyst.  No visible coronary artery calcifications.  Review of the MIP images confirms the above findings.  IMPRESSION: No evidence of pulmonary embolus.   Electronically Signed   By: Charlett Nose   On: 06/30/2013 13:59    Review of Systems  Constitutional: Negative for fever and diaphoresis.  HENT: Negative for congestion and sore throat.   Respiratory: Positive for shortness of breath. Negative for cough.   Cardiovascular: Positive for chest pain. Negative for orthopnea, leg swelling and PND.  Gastrointestinal: Negative for nausea, vomiting, abdominal pain, blood in stool and melena.  Genitourinary: Negative for hematuria.  Musculoskeletal: Positive for myalgias and back pain.       Chest pain radiated to back  Neurological: Negative for dizziness.  All other systems reviewed and are negative.    Blood pressure 127/73, pulse 76, temperature 98.5 F (36.9 C), temperature source Oral, resp. rate 16, height 5' (1.524 m), weight 141 lb (63.957 kg), last menstrual period 06/15/2013, SpO2 100.00%. Physical Exam  Nursing note and vitals  reviewed. Constitutional: She is oriented to person, place, and time. She appears well-developed and well-nourished. No distress.  HENT:  Head: Normocephalic and atraumatic.  Mouth/Throat: Oropharynx is clear and moist. No oropharyngeal exudate.  Eyes: EOM are normal. Pupils are equal, round, and reactive to light. No scleral icterus.  Neck: Normal range of motion. Neck supple. No JVD present.  Cardiovascular: Normal rate, regular rhythm, S1 normal and S2 normal.   No murmur heard. Pulses:      Radial pulses are 2+ on the right side, and 2+ on the left side.       Posterior tibial pulses are 2+ on the right side, and 2+ on the left side.  No carotid bruit.  Respiratory: Effort normal and breath sounds normal. She has no wheezes. She has no rales.  GI: Soft. Bowel sounds are normal. She exhibits no distension. There is no tenderness.  Musculoskeletal: She exhibits no edema.  Lymphadenopathy:    She has no cervical adenopathy.  Neurological: She is alert and oriented to person, place, and time. She exhibits normal muscle tone.  Skin: Skin is warm and dry.  Psychiatric: She has a normal mood and affect.  Assessment/Plan Principal Problem:   NSTEMI (non-ST elevated myocardial infarction) Active Problems:   Hypertension   Chest pain  Plan:  The patient was started on IV heparin.  She will be admitted to telemetry.  I will continue to cycle troponin.  Check lipids, A1C, TSH.  She will need a left heart cath tomorrow.    HAGER, BRYAN 06/30/2013, 2:41 PM    Agree with note written by Jones Skene PAC  + CRF. Sx C/W [place]. + Trop. Exam benign. EKG w/o acute changes. IV hep. Pain free. Cycle enz. Cath tomorrow.   Runell Gess 06/30/2013 5:41 PM

## 2013-06-30 NOTE — Progress Notes (Signed)
ANTICOAGULATION CONSULT NOTE - Initial Consult  Pharmacy Consult for Lovenox Indication: chest pain/ACS  Allergies  Allergen Reactions  . Nitrofurantoin Monohyd Macro Nausea And Vomiting    Patient Measurements: Height: 5' (152.4 cm) Weight: 136 lb 2 oz (61.746 kg) IBW/kg (Calculated) : 45.5Dosing Weight: 63.6 kg  Vital Signs: Temp: 98.4 F (36.9 C) (08/26 2016) Temp src: Oral (08/26 2016) BP: 134/79 mmHg (08/26 2016) Pulse Rate: 79 (08/26 2016)  Labs:  Recent Labs  06/30/13 0952  HGB 11.0*  HCT 33.0*  PLT 224  CREATININE 0.72  TROPONINI 0.37*    Estimated Creatinine Clearance: 72.1 ml/min (by C-G formula based on Cr of 0.72).   Medical History: Past Medical History  Diagnosis Date  . Lymphedema of arm     DUE TO LEFT   AXILLARY NODE DISSECTION   AND RADIATION  . 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 Migraines   . Breast cancer SEPTEMBER 2002    LEFT BREAST-SURGERY,CHEMO,RADIATION-DR.LIVESAY  . 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 BRCA1 negative 08/2011  . Hypertension     Medications:  Prescriptions prior to admission  Medication Sig Dispense Refill  . baclofen (LIORESAL) 10 MG tablet Take 10 mg by mouth 3 (three) times daily as needed. Migraine. Limit to 2 headaches per week.      . topiramate (TOPAMAX) 25 MG tablet Take 25 mg by mouth at bedtime. New medication suppose to start today.        Assessment: 47 year old woman initially started on heparin for ACS.  There was difficulty obtaining labs for heparin monitoring.  Heparin to be stopped and changed to Lovenox.   Plan:  Lovenox 60mg  sq Q12. Will plan on checking a CBC at least every 3 days.  Mickeal Skinner 06/30/2013,8:48 PM

## 2013-06-30 NOTE — ED Notes (Signed)
Reported elevated Troponin, .37 to Dr. Juleen China.

## 2013-06-30 NOTE — ED Notes (Signed)
Cardiology at the bedside.

## 2013-06-30 NOTE — ED Notes (Signed)
Results of i-stat troponin shown to Dr. Juleen China

## 2013-06-30 NOTE — ED Notes (Signed)
Pt states was sitting at work and started having mid chest pain and did radiate to left arm.  Pt appears tachypneic and complains of bilateral arm tingling.  Pt reports right side neck pain that radiates down arm

## 2013-06-30 NOTE — ED Notes (Signed)
Pt. Denies any pain at present time.  Ct scan notified of pt.s Iv for test is in place

## 2013-06-30 NOTE — ED Provider Notes (Signed)
CSN: [id number]     Arrival date & time 06/30/13  1324 History   First MD Initiated Contact with Patient 06/30/13 0900     Chief Complaint  Patient presents with  . Chest Pain   (Consider location/radiation/quality/duration/timing/severity/associated sxs/prior Treatment) HPI Comments: 47 year old female with a past medical history of hypertension, breast cancer and migraines presents to the emergency department complaining of sudden onset midsternal chest pain radiating to her left arm and her back beginning about one hour prior to arrival while sitting at her desk at work. Pain described as a tight feeling in rated 8/10. She has not had any alleviating factors. Pain worse with inspiration. Admits to slight shortness of breath and mild nausea. Also states she has tingling in her bilateral forearms. Prior to going to work this morning patient was feeling fine. 5 days she was taken off of her blood pressure medication because it was making her dizzy, she was on this medication for about 5 weeks. She is a nonsmoker, not on any oral contraceptive or exogenous estrogen. No recent surgeries. No recent long travel. Nonsmoker. Mom has a history of heart disease in her 40s.  Patient is a 47 y.o. female presenting with chest pain. The history is provided by the patient.  Chest Pain Associated symptoms: nausea and shortness of breath   Associated symptoms: no diaphoresis, no fever and not vomiting     Past Medical History  Diagnosis Date  . Lymphedema of arm     DUE TO LEFT   AXILLARY NODE DISSECTION   AND RADIATION  . 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 Migraines   . Breast cancer SEPTEMBER 2002    LEFT BREAST-SURGERY,CHEMO,RADIATION-DR.LIVESAY  . 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 BRCA1 negative 08/2011  . Hypertension    Past Surgical History  Procedure Laterality Date  . Bilateral tubal    . Tubal ligation      BILATERAL  . Mastectomy,  partial  07/23/2001    LEFT BREAST WITH AXILLARY NODE EVALUATION  . Hysteroscopy  12/10/07    RESECTOSCOPIC POLYPECTOMY/MYOMECTOMY  . Breast lumpectomy      AE 36  . Augmentation mammaplasty  2008   Family History  Problem Relation Age of Onset  . Hypertension Mother   . Heart disease Mother   . Hypertension Father   . Prostate cancer Father 2  . Hypertension Sister   . Hypertension Sister   . Cancer Maternal Aunt     unknown type  . Stomach cancer Paternal Uncle   . Leukemia Paternal Uncle    History  Substance Use Topics  . Smoking status: Never Smoker   . Smokeless tobacco: Not on file  . Alcohol Use: No   OB History   Grav Para Term Preterm Abortions TAB SAB Ect Mult Living   2 2        2      Review of Systems  Constitutional: Negative for fever, chills and diaphoresis.  Respiratory: Positive for shortness of breath.   Cardiovascular: Positive for chest pain.  Gastrointestinal: Positive for nausea. Negative for vomiting.  Neurological: Negative for syncope.  All other systems reviewed and are negative.    Allergies  Nitrofurantoin monohyd macro  Home Medications   Current Outpatient Rx  Name  Route  Sig  Dispense  Refill  . butalbital-aspirin-caffeine (FIORINAL) 50-325-40 MG per capsule   Oral   Take 1 capsule by  mouth 2 (two) times daily as needed.          . clobetasol cream (TEMOVATE) 0.05 %               . Fe Fum-FePoly-Vit C-Vit B3 (INTEGRA) 62.5-62.5-40-3 MG CAPS      TAKE 1 TABLET DAILY FOR LOW IRON. TAKE ON EMPTY STOMACH WITH OJ IF POSSIBLE   30 capsule   5     MAY DISPENSE FERROUS FUMARATE OR FERROUS GLUCONATE .Marland Kitchen.   . ibuprofen (ADVIL,MOTRIN) 600 MG tablet      TAKE ONE TABLET BY MOUTH EVERY 6 HOURS AS NEEDED FOR PAIN   60 tablet   1   . magnesium 30 MG tablet   Oral   Take 30 mg by mouth 2 (two) times daily.         . metroNIDAZOLE (METROGEL VAGINAL) 0.75 % vaginal gel      1 applicator per vagina at HS x 5   70 g    1   . Olmesartan-Amlodipine-HCTZ (TRIBENZOR) 40-10-25 MG TABS   Oral   Take 1 tablet by mouth daily.           . SUMAtriptan (IMITREX) 50 MG tablet               . valACYclovir (VALTREX) 500 MG tablet   Oral   Take 1 tablet (500 mg total) by mouth 2 (two) times daily.   30 tablet   1    BP 152/85  Pulse 103  Temp(Src) 98.5 F (36.9 C) (Oral)  Resp 26  SpO2 100%  LMP 06/15/2013 Physical Exam  Nursing note and vitals reviewed. Constitutional: She is oriented to person, place, and time. She appears well-developed and well-nourished. No distress.  HENT:  Head: Normocephalic and atraumatic.  Mouth/Throat: Oropharynx is clear and moist.  Eyes: Conjunctivae and EOM are normal. Pupils are equal, round, and reactive to light.  Neck: Normal range of motion. Neck supple. No JVD present. No tracheal deviation present.  Cardiovascular: Regular rhythm, normal heart sounds and normal pulses.  Tachycardia present.   Mild tachycardia.  Pulmonary/Chest: Breath sounds normal. Tachypnea noted. No respiratory distress. She has no decreased breath sounds. She has no wheezes. She has no rhonchi. She has no rales.  Abdominal: Soft. Bowel sounds are normal. She exhibits no distension. There is no tenderness.  Musculoskeletal: Normal range of motion. She exhibits no edema.  Neurological: She is alert and oriented to person, place, and time. She has normal strength. No sensory deficit.  Skin: Skin is warm and dry. She is not diaphoretic.  Psychiatric: She has a normal mood and affect. Her behavior is normal.    ED Course  Procedures (including critical care time) Labs Review Labs Reviewed  CBC - Abnormal; Notable for the following:    WBC 12.4 (*)    Hemoglobin 11.0 (*)    HCT 33.0 (*)    RDW 16.3 (*)    All other components within normal limits  BASIC METABOLIC PANEL - Abnormal; Notable for the following:    Potassium 3.4 (*)    All other components within normal limits  D-DIMER,  QUANTITATIVE - Abnormal; Notable for the following:    D-Dimer, Quant 1.86 (*)    All other components within normal limits  TROPONIN I - Abnormal; Notable for the following:    Troponin I 0.37 (*)    All other components within normal limits  POCT I-STAT TROPONIN I - Abnormal; Notable for the  following:    Troponin i, poc 0.11 (*)    All other components within normal limits  HEPARIN LEVEL (UNFRACTIONATED)   Imaging Review Dg Chest 2 View  06/30/2013   CLINICAL DATA:  Chest and back pain. Slight shortness Breath.  EXAM: CHEST  2 VIEW  COMPARISON:  None.  FINDINGS: The heart size and mediastinal contours are within normal limits. Both lungs are clear. The visualized skeletal structures are unremarkable.  IMPRESSION: No active cardiopulmonary disease.   Electronically Signed   By: Charlett Nose   On: 06/30/2013 09:28   Ct Angio Chest Pe W/cm &/or Wo Cm  06/30/2013   CLINICAL DATA:  Chest pain.  EXAM: CT ANGIOGRAPHY CHEST WITH CONTRAST  TECHNIQUE: Multidetector CT imaging of the chest was performed using the standard protocol during bolus administration of intravenous contrast. Multiplanar CT image reconstructions including MIPs were obtained to evaluate the vascular anatomy.  CONTRAST:  55mL OMNIPAQUE IOHEXOL 350 MG/ML SOLN  COMPARISON:  None  FINDINGS: No filling defects in the pulmonary arteries to suggest pulmonary emboli. Lungs are clear. No focal airspace opacitiesor suspicious nodules. No effusions. Insert Heart No mediastinal, hilar, or axillary adenopathy. Visualized thyroid and chest wall soft tissues unremarkable. Bilateral breast implants noted.  Imaging into the upper abdomen shows no acute findings. Low-density lesion in the left hepatic lobe measuring 19 mm, most likely a small cyst.  No visible coronary artery calcifications.  Review of the MIP images confirms the above findings.  IMPRESSION: No evidence of pulmonary embolus.   Electronically Signed   By: Charlett Nose   On: 06/30/2013  13:59     Date: 06/30/2013  Rate: 94  Rhythm: normal sinus rhythm  QRS Axis: normal  Intervals: normal  ST/T Wave abnormalities: normal  Conduction Disutrbances:none  Narrative Interpretation: abnormal R-wave progression v2-v3, unchanged from prior EKG from 12/10/2007  Old EKG Reviewed: unchanged   MDM   1. NSTEMI (non-ST elevated myocardial infarction)     Patient with pleuritic chest pain, tachycardia, tachypnea. O2 sat 100% on RA. Patient is not PERC negative. Labs pending- cbc, bmp, troponin, d-dimer. CXR pending. EKG without changes from prior. She is in NAD. Toradol, zofran. 10:16 AM Patient does not want zofran, no longer nauseated. I-stat troponin elevated 0.11, will obtain repeat regular troponin. Chest pain 5/10 from 8/10 after toradol. Has a headache. Will give reglan. 10:42 AM Headache improved with reglan. D-dimer elevated, will obtain CT angio to r/o PE. 2:06 PM CTA negative for PE. Repeat troponin 0.37. Heparin bolus/infusion. Will consult cardiology for admission for NSTEMI. 2:30 PM I spoke with [name] with [facility], patient will be evaluated by Wilburt Finlay, PA for admission. 3:19 PM Patient evaluated by Wilburt Finlay, PA with [facility] who will admit patient.  Trevor Mace, PA-C 06/30/13 1520

## 2013-06-30 NOTE — ED Notes (Signed)
Pt. oob to the bathroom, gait steady.  

## 2013-06-30 NOTE — ED Notes (Signed)
Dinner tray ordered.

## 2013-06-30 NOTE — ED Notes (Signed)
Spoke with Dr. Tresa Endo, orders received to draw blood from pt.s foot.

## 2013-06-30 NOTE — ED Notes (Signed)
Pt. oob to the bathroom gait steady 

## 2013-07-01 ENCOUNTER — Encounter (HOSPITAL_COMMUNITY)
Admission: EM | Disposition: A | Discharge status: Home / Self Care | Payer: Self-pay | Source: Home / Self Care | Attending: Cardiovascular Disease

## 2013-07-01 DIAGNOSIS — I214 Non-ST elevation (NSTEMI) myocardial infarction: Principal | ICD-10-CM

## 2013-07-01 HISTORY — PX: CARDIAC CATHETERIZATION: SHX172

## 2013-07-01 HISTORY — PX: LEFT HEART CATHETERIZATION WITH CORONARY ANGIOGRAM: SHX5451

## 2013-07-01 LAB — BASIC METABOLIC PANEL
BUN: 8 mg/dL (ref 6–23)
CO2: 22 mEq/L (ref 19–32)
Calcium: 8.9 mg/dL (ref 8.4–10.5)
Creatinine, Ser: 0.77 mg/dL (ref 0.50–1.10)
GFR calc non Af Amer: >90 mL/min (ref >90)
Glucose, Bld: 84 mg/dL (ref 70–99)
Sodium: 136 mEq/L (ref 135–145)

## 2013-07-01 LAB — LIPID PANEL
Cholesterol: 165 mg/dL (ref 0–200)
HDL: 78 mg/dL (ref >39)
Total CHOL/HDL Ratio: 2.1 RATIO
Triglycerides: 71 mg/dL (ref <150)
VLDL: 14 mg/dL (ref 0–40)

## 2013-07-01 LAB — TROPONIN I: Troponin I: <0.3 ng/mL (ref <0.30)

## 2013-07-01 LAB — PROTIME-INR: INR: 1.08 (ref 0.00–1.49)

## 2013-07-01 LAB — HEMOGLOBIN A1C: Mean Plasma Glucose: 117 mg/dL — ABNORMAL HIGH (ref <117)

## 2013-07-01 SURGERY — LEFT HEART CATHETERIZATION WITH CORONARY ANGIOGRAM
Anesthesia: LOCAL

## 2013-07-01 MED ORDER — HEPARIN SODIUM (PORCINE) 1000 UNIT/ML IJ SOLN
INTRAMUSCULAR | Status: AC
  Filled 2013-07-01: qty 1

## 2013-07-01 MED ORDER — VERAPAMIL HCL 2.5 MG/ML IV SOLN
INTRAVENOUS | Status: AC
  Filled 2013-07-01: qty 2

## 2013-07-01 MED ORDER — FENTANYL CITRATE 0.05 MG/ML IJ SOLN
INTRAMUSCULAR | Status: AC
  Filled 2013-07-01: qty 2

## 2013-07-01 MED ORDER — SODIUM CHLORIDE 0.9 % IJ SOLN
3.0000 mL | Freq: Two times a day (BID) | INTRAMUSCULAR | Status: DC
  Administered 2013-07-01 – 2013-07-02 (×2): 3 mL via INTRAVENOUS

## 2013-07-01 MED ORDER — SODIUM CHLORIDE 0.9 % IJ SOLN: 3.0000 mL | INTRAMUSCULAR | Status: DC | PRN

## 2013-07-01 MED ORDER — SODIUM CHLORIDE 0.9 % IV SOLN: 250.0000 mL | INTRAVENOUS | Status: DC | PRN

## 2013-07-01 MED ORDER — SODIUM CHLORIDE 0.9 % IV SOLN: 1.0000 mL/kg/h | INTRAVENOUS | Status: AC

## 2013-07-01 MED ORDER — MIDAZOLAM HCL 2 MG/2ML IJ SOLN
INTRAMUSCULAR | Status: AC
  Filled 2013-07-01: qty 2

## 2013-07-01 NOTE — CV Procedure (Signed)
CARDIAC CATHETERIZATION REPORT  NAME:  Tara Bell   MRN: [id number] DOB:  09-05-1966   ADMIT DATE: 06/30/2013 Procedure Date: 07/01/2013  INTERVENTIONAL CARDIOLOGIST: Marykay Lex, M.D., MS PRIMARY CARE PROVIDER: [name], MD PRIMARY CARDIOLOGIST: Runell Gess, M.D.  PATIENT:  Tara Bell is a 46 y.o. female with a history of left breast cancer and migraines as well as borderline hypertension.  She had significant family history mother died from an MI at age 65.  The yesterday to the emergency room with a 30 minute episode of chest discomfort.  She had mild elevation to 0.37.  Remaining troponins were negative overnight.  She remained chest pain free overnight.  Due to her family history and mild elevation, she was referred for cardiac catheterization by Dr. Allyson Sabal he saw her in the emergency room  PRE-OPERATIVE DIAGNOSIS:    Non-STEMI  PROCEDURES PERFORMED:    Left Heart Catheterization with Native Coronary Angiography  PROCEDURE:Consent:  Risks of procedure as well as the alternatives and risks of each were explained to the (patient/caregiver).  Consent for procedure obtained. Consent for signed by MD and patient with RN witness -- placed on chart.   PROCEDURE: The patient was brought to the 2nd Floor [facility] in the fasting state and prepped and draped in the usual sterile fashion for Right groin or radial access. A modified Allen's test with plethysmography was performed, revealing excellent Ulnar artery collateral flow.  Sterile technique was used including antiseptics, cap, gloves, gown, hand hygiene, mask and sheet.  Skin prep: Chlorhexidine.  Time Out: Verified patient identification, verified procedure, site/side was marked, verified correct patient position, special equipment/implants available, medications/allergies/relevent history reviewed, required imaging and test results available.  Performed  Access: Right Radial  Artery; 6 Fr Sheath -- Seldinger technique (Angiocath Micropuncture Kit)  Intra-arterial Radial Cocktail, IV Heparin Diagnostic:  TIG 4.0, JR 4catheters advanced and exchanged over long exchange safety J-wire  Left Coronary Artery Angiography: TIG 4.0  Right Coronary Artery Angiography: JR 4  LV Hemodynamics (LV Gram): JR 4 -- hand ejection  TR Band:  0950 Hours, 10 mL air  MEDICATIONS:  Anesthesia:  Local Lidocaine 2 ml  Sedation:  2 mg IV Versed, 25 mcg IV fentanyl ;   Omnipaque Contrast: 55 ml  Anticoagulation:  IV Heparin 3000 Units   Normal Saline bolus 250 mL  Hemodynamics:  Central Aortic / Mean Pressures: 100/13 mmHg; 11 mmHg  Left Ventricular Pressures / EDP: 16 less than 7 mmHg; 99 mmHg  Left Ventriculography:  EF: 55-60%, no obvious regional wall motion abnormalities.  Coronary Anatomy: Overall tortuous coronary arteries, but no angiographic evidence of coronary artery disease  Left Main: Large caliber vessel --> LAD, Circumflex & Ramus(High OM), angiographically normal LAD: large caliber tortuous vessel, that gives rise to a proximal diagonal branch followed by a smaller caliber septal perforator trunk.  The vessel then continues down around the apex perfusing the inferoapex.  There is several small first and diagonal branches  D1: Moderate caliber vessel that courses along the anterolateral wall.  Its main branches off.  Left Circumflex: Moderate large-caliber vessel that gives off a proximal atrial branch and a sharp bend in the AV groove.  It gives off a posterior lateral obtuse marginal that bifurcates and terminates as a posterolateral branch.  Ramus intermedius: Moderate caliber vessel, same size as the diagonal, it courses as a high OM covering the inferolateral border of each were larger and a smaller branch distally  at both reach inferolateral border.   RCA: Large caliber vessel that takes a double bend before going into the mid vessel.  It  terminates distally the Right Posterior Descending artery reaching almost always the apex.  There is a Right Posterior AV Groove Branch (RPAV) branch that gives rise to the AV nodal artery and a very small posterior lateral branch.   PATIENT DISPOSITION:    The patient was transferred to the PACU holding area in a hemodynamicaly stable, chest pain free condition.  The patient tolerated the procedure well, and there were no complications.  EBL:   < 5 ml  The patient was stable before, during, and after the procedure.  POST-OPERATIVE DIAGNOSIS:    Angiographically normal coronary arteries with normal left atrial function.  Normal LV pressures.  Unclear etiology due to the patient's chest pain and positive troponin.  PLAN OF CARE:  Standard post radial cath care.  2-D echocardiogram to assess for possible pleural effusion, as pericarditis is another possible etiology.  Depending on results the echocardiogram, could consider discharge as early as this afternoon versus tomorrow morning.   Marykay Lex, M.D., M.S. [facility] [address] [place], [place]  [address]  [phone]  07/01/2013 9:57 AM

## 2013-07-01 NOTE — Progress Notes (Signed)
*  PRELIMINARY RESULTS* Echocardiogram 2D Echocardiogram has been performed.  Tara Bell 07/01/2013, 4:52 PM

## 2013-07-01 NOTE — Progress Notes (Signed)
TR BAND REMOVAL  LOCATION:    right radial  DEFLATED PER PROTOCOL:    yes  TIME BAND OFF / DRESSING APPLIED:    1230   SITE UPON ARRIVAL:    Level 0  SITE AFTER BAND REMOVAL:    Level 0  REVERSE ALLEN'S TEST:     positive  CIRCULATION SENSATION AND MOVEMENT:    Within Normal Limits   yes  COMMENTS:    Initial  TR band air draw rebled at incision  Site, TR band protocol restarted after ,   tolerated procedure well

## 2013-07-01 NOTE — Interval H&P Note (Signed)
History and Physical Interval Note:  07/01/2013 8:30 AM  Hardie Pulley  has presented today for surgery, with the diagnosis of CP-ACS/Mild NSTEMI. The various methods of treatment have been discussed with the patient and family. After consideration of risks, benefits and other options for treatment, the patient has consented to  Procedure(s): LEFT HEART CATHETERIZATION WITH CORONARY ANGIOGRAM (N/A) +/- PCI as a surgical intervention .  The patient's history has been reviewed, patient examined, no change in status, stable for surgery.  I have reviewed the patient's chart and labs.  Questions were answered to the patient's satisfaction.   Cath Lab Visit (complete for each Cath Lab visit)  Clinical Evaluation Leading to the Procedure:   ACS: yes  - Troponin elevated.  Non-ACS:    Anginal Classification: CCS III  Anti-ischemic medical therapy: Minimal Therapy (1 class of medications)  Non-Invasive Test Results: No non-invasive testing performed  Prior CABG: No previous CABG  Abid Bolla W

## 2013-07-02 DIAGNOSIS — I214 Non-ST elevation (NSTEMI) myocardial infarction: Secondary | ICD-10-CM

## 2013-07-02 DIAGNOSIS — I059 Rheumatic mitral valve disease, unspecified: Secondary | ICD-10-CM

## 2013-07-02 DIAGNOSIS — R079 Chest pain, unspecified: Secondary | ICD-10-CM

## 2013-07-02 MED ORDER — ATORVASTATIN CALCIUM 20 MG PO TABS: 20.0000 mg | ORAL_TABLET | Freq: Every day | ORAL | Status: DC

## 2013-07-02 MED ORDER — METOPROLOL TARTRATE 12.5 MG HALF TABLET: 12.5000 mg | ORAL_TABLET | Freq: Two times a day (BID) | ORAL | Status: DC

## 2013-07-02 MED ORDER — ASPIRIN 81 MG PO TBEC: 81.0000 mg | DELAYED_RELEASE_TABLET | Freq: Every day | ORAL | Status: DC

## 2013-07-02 MED ORDER — NITROGLYCERIN 0.4 MG SL SUBL: 0.4000 mg | SUBLINGUAL_TABLET | SUBLINGUAL | Status: DC | PRN

## 2013-07-02 NOTE — Discharge Summary (Signed)
Physician Discharge Summary  Patient ID: Tara Bell MRN: [id number] DOB/AGE: [birth year] 46 y.o.  Admit date: 06/30/2013 Discharge date: 07/02/2013  Admission Diagnoses: NSTEMI  Discharge Diagnoses:  Principal Problem:   NSTEMI - normal coronaries on cath 07/01/13 Active Problems:   Hypertension   Chest pain   Discharged Condition: stable  Hospital Course: Tara Bell is a 47 y.o. female with a history of left breast cancer and migraines as well as borderline hypertension. She has significant family history of heart disease, with her mother dying from an MI at age 87. On 06/30/13 she presented to the [facility] ER with a complaint of chest pain. She had mild elevation in her initial troponin of 0.37. The remaining subsequent troponin's were negative. She had resolution of her chest pain with NTG. However, due to her family history and mild troponin elevation, a left heart craterization was recommended. The procedure was performed by Dr. Herbie Baltimore, via the right radial artery. The cath demonstrated angiographically normal coronary arteries with normal left ventricular function. Her EF was 55-60%. She left the cath lab in stable condition. She was kept overnight for further monitoring and hydration. She had no post-cath complications. She denied further chest pain. The right radial access site remained stable, as did renal function. A 2D echo was performed which demonstrated mild diastolic dysfunction but no evidence for pericarditis. The etiology of the patient's chest pain and elevated troponin was unclear. She was last seen and examined by Dr. Rennis Golden, who determined that she was stable for discharge home. She will follow up with [name], NP, at [facility] on 07/13/13.    Consults: None  Significant Diagnostic Studies:   LHC Hemodynamics:  Central Aortic / Mean Pressures: 100/13 mmHg; 11 mmHg  Left Ventricular Pressures / EDP: 16 less than 7 mmHg; 99 mmHg Left Ventriculography:  EF:  55-60%, no obvious regional wall motion abnormalities. Coronary Anatomy: Overall tortuous coronary arteries, but no angiographic evidence of coronary artery disease  Left Main: Large caliber vessel --> LAD, Circumflex & Ramus(High OM), angiographically normal LAD: large caliber tortuous vessel, that gives rise to a proximal diagonal branch followed by a smaller caliber septal perforator trunk. The vessel then continues down around the apex perfusing the inferoapex. There is several small first and diagonal branches  D1: Moderate caliber vessel that courses along the anterolateral wall. Its main branches off. Left Circumflex: Moderate large-caliber vessel that gives off a proximal atrial branch and a sharp bend in the AV groove. It gives off a posterior lateral obtuse marginal that bifurcates and terminates as a posterolateral branch.  Ramus intermedius: Moderate caliber vessel, same size as the diagonal, it courses as a high OM covering the inferolateral border of each were larger and a smaller branch distally at both reach inferolateral border.  RCA: Large caliber vessel that takes a double bend before going into the mid vessel. It terminates distally the Right Posterior Descending artery reaching almost always the apex. There is a Right Posterior AV Groove Branch (RPAV) branch that gives rise to the AV nodal artery and a very small posterior lateral branch   2D echo Study Conclusions  - Left ventricle: The cavity size was normal. Wall thickness was normal. Systolic function was normal. The estimated ejection fraction was in the range of 55% to 60%. Wall motion was normal; there were no regional wall motion abnormalities. Doppler parameters are consistent with abnormal left ventricular relaxation (grade 1 diastolic dysfunction). The E/e' ratio is >10, suggesting elevated LV filling  pressure. - Mitral valve: Mildly thickened leaflets . Mild regurgitation. - Left atrium: The atrium was normal  in size. - Atrial septum: No defect or patent foramen ovale was identified. - Inferior vena cava: The vessel was normal in size; the respirophasic diameter changes were in the normal range (= 50%); findings are consistent with normal central venous pressure. - Pericardium, extracardiac: The pericardium was normal in appearance. There was no pericardial effusion.    Treatments: See Hospital Course  Discharge Exam: Blood pressure 129/50, pulse 90, temperature 98.2 F (36.8 C), temperature source Oral, resp. rate 18, height 5' (1.524 m), weight 140 lb 14 oz (63.9 kg), last menstrual period 06/15/2013, SpO2 99.00%.   Disposition:   Discharge Orders   Future Appointments Provider Department Dept Phone   07/13/2013 2:20 PM [name], NP [facility] [place] [phone]   11/02/2013 5:10 PM [facility] [place] [phone]   Patient should wear two piece clothing and wear no powder   11/30/2013 9:00 AM [name], MD [facility] MEDICAL ONCOL[facility]Y [phone]   Future Orders Complete By Expires   Diet - low sodium heart healthy  As directed    Increase activity slowly  As directed        Medication List         aspirin 81 MG EC tablet  Take 1 tablet (81 mg total) by mouth daily.     atorvastatin 20 MG tablet  Commonly known as:  LIPITOR  Take 1 tablet (20 mg total) by mouth daily at 6 PM.     baclofen 10 MG tablet  Commonly known as:  LIORESAL  Take 10 mg by mouth 3 (three) times daily as needed. Migraine. Limit to 2 headaches per week.     metoprolol tartrate 12.5 mg Tabs tablet  Commonly known as:  LOPRESSOR  Take 0.5 tablets (12.5 mg total) by mouth 2 (two) times daily.     nitroGLYCERIN 0.4 MG SL tablet  Commonly known as:  NITROSTAT  Place 1 tablet (0.4 mg total) under the tongue every 5 (five) minutes x 3 doses as needed for chest pain.     topiramate 25 MG tablet  Commonly known as:   TOPAMAX  Take 25 mg by mouth at bedtime. New medication suppose to start today.           Follow-up Information   Follow up with [facility] R, NP On 07/13/2013. (2:20 pm)    Specialty:  Cardiology   Contact information:   [address] [place] [address] [phone]      TIME SPENT ON DISCHARGE SUMMARY, INCLUDING PHYSICIAN TIME: >30 MINUTES  Signed: Allayne Butcher, PA-C 07/02/2013, 11:45 AM

## 2013-07-02 NOTE — Progress Notes (Signed)
[facility]  Subjective: No complaints. Denies any chest pain.   Objective: Vital signs in last 24 hours: Temp:  [97.5 F (36.4 C)-98.3 F (36.8 C)] 98.2 F (36.8 C) (08/28 0816) Pulse Rate:  [61-99] 90 (08/28 0816) Resp:  [16-20] 18 (08/28 0816) BP: (129-149)/(50-91) 129/50 mmHg (08/28 0816) SpO2:  [98 %-100 %] 99 % (08/28 0816) Weight:  [140 lb (63.504 kg)-140 lb 14 oz (63.9 kg)] 140 lb 14 oz (63.9 kg) (08/28 0550) Last BM Date: 06/30/13  Intake/Output from previous day: 08/27 0701 - 08/28 0700 In: 637.7 [P.O.:360; I.V.:277.7] Out: 400 [Urine:400] Intake/Output this shift:    Medications Current Facility-Administered Medications  Medication Dose Route Frequency Provider Last Rate Last Dose  . 0.9 %  sodium chloride infusion  250 mL Intravenous PRN Marykay Lex, MD      . acetaminophen (TYLENOL) tablet 650 mg  650 mg Oral Q4H PRN Wilburt Finlay, PA-C      . aspirin EC tablet 81 mg  81 mg Oral Daily Wilburt Finlay, PA-C      . atorvastatin (LIPITOR) tablet 20 mg  20 mg Oral q1800 Wilburt Finlay, PA-C   20 mg at 07/01/13 2148  . metoprolol tartrate (LOPRESSOR) tablet 12.5 mg  12.5 mg Oral BID Wilburt Finlay, PA-C   12.5 mg at 07/01/13 2225  . nitroGLYCERIN (NITROSTAT) SL tablet 0.4 mg  0.4 mg Sublingual Q5 Min x 3 PRN Wilburt Finlay, PA-C      . ondansetron [facility]) injection 4 mg  4 mg Intravenous Once Robyn M Albert, PA-C      . ondansetron [facility]) injection 4 mg  4 mg Intravenous Q6H PRN Wilburt Finlay, PA-C      . sodium chloride 0.9 % injection 3 mL  3 mL Intravenous Q12H Marykay Lex, MD      . sodium chloride 0.9 % injection 3 mL  3 mL Intravenous PRN Marykay Lex, MD      . topiramate (TOPAMAX) tablet 25 mg  25 mg Oral QHS Wilburt Finlay, PA-C   25 mg at 06/30/13 2145    PE: General appearance: alert, cooperative and no distress Lungs: clear to auscultation bilaterally Heart: regular rate and rhythm, S1, S2 normal, no murmur, click, rub or  gallop Extremities: no LEE Pulses: 2+ and symmetric Skin: warm and dry Neurologic: Grossly normal  Lab Results:   Recent Labs  06/30/13 0952  WBC 12.4*  HGB 11.0*  HCT 33.0*  PLT 224   BMET  Recent Labs  06/30/13 0952 07/01/13 0450  NA 138 136  K 3.4* 3.6  CL 104 104  CO2 25 22  GLUCOSE 93 84  BUN 9 8  CREATININE 0.72 0.77  CALCIUM 8.8 8.9   PT/INR  Recent Labs  07/01/13 0445  LABPROT 13.8  INR 1.08   Cholesterol  Recent Labs  07/01/13 0450  CHOL 165   Cardiac Enzymes Cardiac Panel (last 3 results)  Recent Labs  06/30/13 2212 07/01/13 0445 07/01/13 1320  TROPONINI <0.30 <0.30 <0.30     Studies/Results:  LHC 07/01/13 Hemodynamics:  Central Aortic / Mean Pressures: 100/13 mmHg; 11 mmHg  Left Ventricular Pressures / EDP: 16 less than 7 mmHg; 99 mmHg Left Ventriculography:  EF: 55-60%, no obvious regional wall motion abnormalities. Coronary Anatomy: Overall tortuous coronary arteries, but no angiographic evidence of coronary artery disease  Left Main: Large caliber vessel --> LAD, Circumflex & Ramus(High OM), angiographically normal LAD: large caliber tortuous vessel, that gives  rise to a proximal diagonal branch followed by a smaller caliber septal perforator trunk. The vessel then continues down around the apex perfusing the inferoapex. There is several small first and diagonal branches  D1: Moderate caliber vessel that courses along the anterolateral wall. Its main branches off. Left Circumflex: Moderate large-caliber vessel that gives off a proximal atrial branch and a sharp bend in the AV groove. It gives off a posterior lateral obtuse marginal that bifurcates and terminates as a posterolateral branch.  Ramus intermedius: Moderate caliber vessel, same size as the diagonal, it courses as a high OM covering the inferolateral border of each were larger and a smaller branch distally at both reach inferolateral border.  RCA: Large caliber vessel that  takes a double bend before going into the mid vessel. It terminates distally the Right Posterior Descending artery reaching almost always the apex. There is a Right Posterior AV Groove Branch (RPAV) branch that gives rise to the AV nodal artery and a very small posterior lateral branch.   Assessment/Plan  Principal Problem:   NSTEMI - normal coronaries on cath 07/01/13 Active Problems:   Hypertension   Chest pain  Plan: LHC yesterday revealed normal coronaries with normal LV function. No further chest pain. Right radial access site stable. HR and BP stable. NSR on telemetry. 2D echo performed yesterday. Report pending. Will have patient ambulate this am. If echo result is normal, plan for discharge home today.      LOS: 2 days    Tara Bell 07/02/2013 8:48 AM

## 2013-07-02 NOTE — Progress Notes (Signed)
Pt. Seen and examined. Agree with the NP/PA-C note as written.  I reviewed her echo today, there is mild diastolic dysfunction but no evidence for pericarditis. Ok for discharge home today. Follow-up with MLP in 7-10 days in the office.  Chrystie Nose, MD, [facility] Attending Cardiologist [facility]

## 2013-07-02 NOTE — ED Provider Notes (Signed)
Medical screening examination/treatment/procedure(s) were conducted as a shared visit with non-physician practitioner(s) and myself.  I personally evaluated the patient during the encounter.  47yF with CP. NSTEMI. Currently CP free. EKG w/o ischemic changes. CT neg for PE. IV heparin started. Cardiology consulted. Admit.   Raeford Razor, MD 07/02/13 [phone]

## 2013-07-03 ENCOUNTER — Telehealth: Payer: Self-pay | Admitting: Cardiology

## 2013-07-03 NOTE — Telephone Encounter (Signed)
Spoke with patient  need a note to return back to work after having a radial cath on 8/27   Discussed  With [place] PA - may return to work after 3 days.  Informed patient she can return to work on 8/31  Which is Sunday . She states she does not go to  work until 07/07/2013. She will be able to pick up a letter today  After 1 pm . She verbalized  Understanding.

## 2013-07-03 NOTE — Telephone Encounter (Signed)
Patient had radial heart cath done Wednesday 07/01/13.  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

## 2013-07-10 ENCOUNTER — Telehealth: Payer: Self-pay | Admitting: Oncology

## 2013-07-10 NOTE — Telephone Encounter (Signed)
Returned pt's call to confirm January 2015 appt. Pt has f/u 11/30/13 but needed lb also. Per pt lb added for same day and she is aware of both lb/KK 11/30/13 @ 8:30am.

## 2013-07-13 ENCOUNTER — Encounter: Payer: Self-pay | Admitting: Cardiology

## 2013-07-13 ENCOUNTER — Ambulatory Visit (INDEPENDENT_AMBULATORY_CARE_PROVIDER_SITE_OTHER): Payer: 59 | Admitting: Cardiology

## 2013-07-13 ENCOUNTER — Ambulatory Visit: Payer: 59 | Admitting: Cardiology

## 2013-07-13 VITALS — BP 146/82 | HR 72 | Ht 60.0 in | Wt 143.0 lb

## 2013-07-13 DIAGNOSIS — R7989 Other specified abnormal findings of blood chemistry: Secondary | ICD-10-CM

## 2013-07-13 DIAGNOSIS — Z9889 Other specified postprocedural states: Secondary | ICD-10-CM | POA: Insufficient documentation

## 2013-07-13 DIAGNOSIS — I10 Essential (primary) hypertension: Secondary | ICD-10-CM

## 2013-07-13 DIAGNOSIS — E785 Hyperlipidemia, unspecified: Secondary | ICD-10-CM

## 2013-07-13 HISTORY — DX: Other specified postprocedural states: Z98.890

## 2013-07-13 NOTE — Assessment & Plan Note (Signed)
Pt did have elevated Troponin, minimally, not felt to be ACS, she has normal coronary arteries and normal EF.Marland Kitchen  Most likely secondary to HTN.  Important to control BP.

## 2013-07-13 NOTE — Progress Notes (Signed)
07/13/2013   PCP: [name], MD   Chief Complaint  Patient presents with  . Follow-up    post hospital, occ DOE, one episode of lightheadedness    Primary Cardiologist: Dr. Herbie Baltimore  HPI: 47 year old African American female presents today status post cardiac catheterization for chest pain and mildly elevated troponin level during hospitalization.  Her cardiac cath was totally normal.  She does have premature history of heart disease in her family as well.  It was felt that perhaps her troponin was elevated secondary to hypertension.  This was not an acute coronary syndrome.   Today she feels well no further chest pain no complaints. After walking her daughter up the steps, across campus at college she was somewhat short of breath no chest pain.  She is tolerating the metoprolol without problems.  We discussed the importance of controlling her blood pressure and to continue her exercise program.  She is on a statin with her family history of heart disease we would like her LDL to be less than 100.   Allergies  Allergen Reactions  . Nitrofurantoin Monohyd Macro Nausea And Vomiting    Current Outpatient Prescriptions  Medication Sig Dispense Refill  . aspirin EC 81 MG EC tablet Take 1 tablet (81 mg total) by mouth daily.      Marland Kitchen atorvastatin (LIPITOR) 20 MG tablet Take 1 tablet (20 mg total) by mouth daily at 6 PM.  30 tablet  5  . baclofen (LIORESAL) 10 MG tablet Take 10 mg by mouth 3 (three) times daily as needed. Migraine. Limit to 2 headaches per week.      . metoprolol tartrate (LOPRESSOR) 12.5 mg TABS tablet Take 0.5 tablets (12.5 mg total) by mouth 2 (two) times daily.  60 tablet  5  . nitroGLYCERIN (NITROSTAT) 0.4 MG SL tablet Place 1 tablet (0.4 mg total) under the tongue every 5 (five) minutes x 3 doses as needed for chest pain.  25 tablet  2  . topiramate (TOPAMAX) 25 MG tablet Take 25 mg by mouth at bedtime. New medication suppose to start today.       No  current facility-administered medications for this visit.    Past Medical History  Diagnosis Date  . Lymphedema of arm     DUE TO LEFT   AXILLARY NODE DISSECTION   AND RADIATION  . 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 Migraines   . Breast cancer SEPTEMBER 2002    LEFT BREAST-SURGERY,CHEMO,RADIATION-DR.LIVESAY  . 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 BRCA1 negative 08/2011  . Hypertension   . S/P cardiac catheterization, with normal coronary arteries and normal EF, 07/01/13 07/13/2013    Past Surgical History  Procedure Laterality Date  . Bilateral tubal    . Tubal ligation      BILATERAL  . Mastectomy, partial  07/23/2001    LEFT BREAST WITH AXILLARY NODE EVALUATION  . Hysteroscopy  12/10/07    RESECTOSCOPIC POLYPECTOMY/MYOMECTOMY  . Breast lumpectomy      AE 35  . Augmentation mammaplasty  2008  . Cardiac catheterization  07/01/13    Angiographically normal coronary arteries with normal left atrial function    BJY:NWGNFAO:ZH colds or fevers, no weight changes, hx of breast cancer Skin:no rashes or ulcers HEENT:no blurred vision, no congestion CV:see HPI PUL:see HPI GI:no diarrhea constipation or melena, no indigestion GU:no hematuria, no dysuria MS:no joint pain, no  claudication Neuro:no syncope, + lightheadedness one mild episode Endo:no diabetes, no thyroid disease  PHYSICAL EXAM BP 146/82  Pulse 72  Ht 5' (1.524 m)  Wt 143 lb (64.864 kg)  BMI 27.93 kg/m2  LMP 06/15/2013 General:Pleasant affect, NAD Skin:Warm and dry, brisk capillary refill HEENT:normocephalic, sclera clear, mucus membranes moist Heart:S1S2 RRR without murmur, gallup, rub or click Lungs:clear without rales, rhonchi, or wheezes WJX:BJYN, non tender, + BS, do not palpate liver spleen or masses Ext:no lower ext edema, 2+ pedal pulses, 2+ radial pulses, no problems at radial cath site. Neuro:alert and oriented, MAE, follows commands, + facial  symmetry   ASSESSMENT AND PLAN Hypertension  Currently on Lopressor 12.5 BID, will continue, if BP continues to be elevated will increase.  Hyperlipidemia LDL goal < 100, normal coronary arteries. On statin, will monitor  Elevated troponin I level, mildly secondary to HTN Pt did have elevated Troponin, minimally, not felt to be ACS, she has normal coronary arteries and normal EF.Marland Kitchen  Most likely secondary to HTN.  Important to control BP.   She will follow up with Dr. Herbie Baltimore in 6 weeks.  She will resume her exercise of walking for 1 hour 3 X week.  She was reassured about her heart but explained need for BP control.  She has not yet started Topamax not having severe migraines.

## 2013-07-13 NOTE — Assessment & Plan Note (Signed)
On statin, will monitor

## 2013-07-13 NOTE — Assessment & Plan Note (Signed)
Currently on Lopressor 12.5 BID, will continue, if BP continues to be elevated will increase.

## 2013-07-13 NOTE — Patient Instructions (Signed)
You did not have a heart attack, maybe a few cells of your heart tissue were so stressed it showed up in the lab work. Your coronary arteries are normal Continue metoprolol twice a day for now.  Follow up with Dr. Herbie Baltimore in 6 weeks.  If you blood pressure goes up call us and we will be glad to adjust meds.  Continue walking for exercise.

## 2013-08-25 ENCOUNTER — Encounter: Payer: Self-pay | Admitting: Genetic Counselor

## 2013-08-26 ENCOUNTER — Telehealth: Payer: Self-pay | Admitting: Genetic Counselor

## 2013-08-26 NOTE — Telephone Encounter (Signed)
Patient called to get clarification about the VUS's that were reclassified.  Reassured her that I will call once we get a reclassification of the BRCA2 VUS, but at this time it is still a VUS.

## 2013-08-26 NOTE — Telephone Encounter (Signed)
Left message that the VUS in TP53 and PTEN have been reclassified to benign.  We are still waiting on the BRCA2 VUS to be reclassified.  Left phone number if she has any questions.  Let her know she will recevie an amended copy of her test results.

## 2013-08-27 ENCOUNTER — Ambulatory Visit (INDEPENDENT_AMBULATORY_CARE_PROVIDER_SITE_OTHER): Payer: 59 | Admitting: Cardiology

## 2013-08-27 ENCOUNTER — Encounter: Payer: Self-pay | Admitting: Cardiology

## 2013-08-27 VITALS — BP 142/80 | HR 67 | Ht 60.0 in | Wt 145.9 lb

## 2013-08-27 DIAGNOSIS — R7989 Other specified abnormal findings of blood chemistry: Secondary | ICD-10-CM

## 2013-08-27 DIAGNOSIS — I10 Essential (primary) hypertension: Secondary | ICD-10-CM

## 2013-08-27 DIAGNOSIS — E785 Hyperlipidemia, unspecified: Secondary | ICD-10-CM

## 2013-08-27 DIAGNOSIS — R079 Chest pain, unspecified: Secondary | ICD-10-CM

## 2013-08-27 MED ORDER — METOPROLOL TARTRATE 25 MG PO TABS: 12.5000 mg | ORAL_TABLET | Freq: Two times a day (BID) | ORAL | Status: DC

## 2013-08-27 NOTE — Patient Instructions (Addendum)
Your physician wants you to follow-up in 6 months DR Herbie Baltimore.  You will receive a reminder letter in the mail two months in advance. If you don't receive a letter, please call our office to schedule the follow-up appointment.  CONTINUE CURRENT MEDICATION

## 2013-08-28 ENCOUNTER — Encounter: Payer: Self-pay | Admitting: Cardiology

## 2013-08-29 NOTE — Assessment & Plan Note (Signed)
Borderline control. Only on 1/2 mg twice a day of Lopressor. She may very well need additional blood pressure control, for now we'll discontinue as it is as she is asymptomatic.

## 2013-08-29 NOTE — Progress Notes (Addendum)
08/29/2013   PCP: [name], MD   Chief Complaint  Patient presents with  . ROV 6 weeks    C/o chest pain, left side, yesterday-only hurt when taking deep breaths.     Primary Cardiologist: Dr. Herbie Baltimore  HPI: 47 year old African American female presents today status post cardiac catheterization for chest pain and mildly elevated troponin level during hospitalization.  Her cardiac cath was totally normal.  She does have premature history of heart disease in her family as well.  It was felt that perhaps her troponin was elevated secondary to hypertension and not an acute coronary syndrome.  She saw [name] back on September 8, and was doing well at that time. She restarted her 1 hour of exercise 3 days week.  Interval History: She continues to do well now without any further chest pain or shortness of breath with rest or exertion. She says that she really pushes it or does a strenuous activities she will get some was short of breath. She does have a little strange sensation underneath her left breast radiating up the side to the midaxillary line that is worse with deep inspiration.  She denies any rapid heartbeats or palpitations/arrhythmias. No lightheadedness, dizziness, wooziness or syncope/ near syncope. No TIA or amaurosis fugax symptoms. No melena, hematochezia or hematuria. No claudication symptoms.   Allergies  Allergen Reactions  . Nitrofurantoin Monohyd Macro Nausea And Vomiting    Current Outpatient Prescriptions  Medication Sig Dispense Refill  . aspirin EC 81 MG EC tablet Take 1 tablet (81 mg total) by mouth daily.      Marland Kitchen atorvastatin (LIPITOR) 20 MG tablet Take 1 tablet (20 mg total) by mouth daily at 6 PM.  30 tablet  5  . metoprolol tartrate (LOPRESSOR) 25 MG tablet Take 0.5 tablets (12.5 mg total) by mouth 2 (two) times daily.  30 tablet  11  . nitroGLYCERIN (NITROSTAT) 0.4 MG SL tablet Place 1 tablet (0.4 mg total) under the tongue every 5 (five) minutes x 3  doses as needed for chest pain.  25 tablet  2  . baclofen (LIORESAL) 10 MG tablet Take 10 mg by mouth 3 (three) times daily as needed. Migraine. Limit to 2 headaches per week.      . topiramate (TOPAMAX) 25 MG tablet Take 25 mg by mouth at bedtime. New medication suppose to start today.       No current facility-administered medications for this visit.    Past Medical History  Diagnosis Date  . Lymphedema of arm     DUE TO LEFT   AXILLARY NODE DISSECTION   AND RADIATION  . 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 Migraines   . Breast cancer SEPTEMBER 2002    LEFT BREAST-SURGERY,CHEMO,RADIATION-DR.LIVESAY  . 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 BRCA1 negative 08/2011  . Hypertension   . S/P cardiac catheterization, with normal coronary arteries and normal EF, 07/01/13 07/13/2013    Past Surgical History  Procedure Laterality Date  . Bilateral tubal    . Tubal ligation      BILATERAL  . Mastectomy, partial  07/23/2001    LEFT BREAST WITH AXILLARY NODE EVALUATION  . Hysteroscopy  12/10/07    RESECTOSCOPIC POLYPECTOMY/MYOMECTOMY  . Breast lumpectomy      AE 35  . Augmentation mammaplasty  2008  . Cardiac catheterization  07/01/13    Angiographically normal coronary arteries with normal left atrial  function   Review of Systems  Constitutional: Negative.   Cardiovascular: Negative.   Gastrointestinal: Negative.   Genitourinary: Negative.   Skin: Negative.   Neurological: Negative.   Endo/Heme/Allergies: Negative.   All other systems reviewed and are negative.   PHYSICAL EXAM BP 142/80  Pulse 67  Ht 5' (1.524 m)  Wt 145 lb 14.4 oz (66.18 kg)  BMI 28.49 kg/m2 General:Pleasant affect, NAD Skin:Warm and dry, brisk capillary refill HEENT:normocephalic, sclera clear, mucus membranes moist Heart:S1S2 RRR without murmur, gallup, rub or click Lungs:clear without rales, rhonchi, or wheezes ZOX:WRUE, non tender, + BS, do not palpate  liver spleen or masses Ext:no lower ext edema, 2+ pedal pulses, 2+ radial pulses, no problems at radial cath site. Neuro:alert and oriented x 3;, follows commands, + facial symmetry, answers questions were  ASSESSMENT AND PLAN Hypertension Borderline control. Only on 1/2 mg twice a day of Lopressor. She may very well need additional blood pressure control, for now we'll discontinue as it is as she is asymptomatic.  Hyperlipidemia LDL goal < 100, normal coronary arteries. Continue statin. We'll need to check labs see her back in 6 months. Current labs are very well controlled as noted in Epic she is a total with LDL 73 and HDL 78  Chest pain Recurrence of disease it is clearly musculoskeletal it is worse with inspiration. Not made worse with a activity.   She will follow up with Dr. Herbie Baltimore in 6 months.   Marykay Lex, M.D., M.S. [facility] [address] [place], [place]  [address]  [phone] Pager # [phone] 08/29/2013 10:17 PM

## 2013-08-29 NOTE — Assessment & Plan Note (Signed)
Recurrence of disease it is clearly musculoskeletal it is worse with inspiration. Not made worse with a activity.

## 2013-08-29 NOTE — Assessment & Plan Note (Signed)
Continue statin. We'll need to check labs see her back in 6 months. Current labs are very well controlled as noted in Epic she is a total with LDL 73 and HDL 78

## 2013-09-02 ENCOUNTER — Other Ambulatory Visit: Payer: Self-pay

## 2013-09-02 MED ORDER — IBUPROFEN 600 MG PO TABS: 600.0000 mg | ORAL_TABLET | Freq: Three times a day (TID) | ORAL | Status: DC | PRN

## 2013-09-10 ENCOUNTER — Other Ambulatory Visit: Payer: Self-pay

## 2013-10-27 ENCOUNTER — Encounter: Payer: Self-pay | Admitting: Women's Health

## 2013-10-27 ENCOUNTER — Ambulatory Visit (INDEPENDENT_AMBULATORY_CARE_PROVIDER_SITE_OTHER): Payer: 59 | Admitting: Women's Health

## 2013-10-27 VITALS — BP 120/80 | Ht 60.0 in | Wt 141.0 lb

## 2013-10-27 DIAGNOSIS — Z1501 Genetic susceptibility to malignant neoplasm of breast: Secondary | ICD-10-CM

## 2013-10-27 DIAGNOSIS — Z01419 Encounter for gynecological examination (general) (routine) without abnormal findings: Secondary | ICD-10-CM

## 2013-10-27 NOTE — Patient Instructions (Signed)

## 2013-10-27 NOTE — Progress Notes (Addendum)
Tara Bell 09/24/1966 [id number]    History:    The patient presents for annual exam.  Monthly cycles/BTL. 2002 age 47, Left breast cancer, lumpectomy with implant, chemotherapy and radiation. BRCA 1 Negative, BRCA 2 inconclusive and has been revised to negative 08/19/2013. Hypertension primary care manages. 06/2013 Cardiac cath normal, was having chest pain. Negative abdominal/ pelvic CT 12/2012. Normal Pap history. Same partner. Occasional discharge with odor, none today. Fibroids, multiple largest 4 cm  2010.  Past medical history, past surgical history, family history and social history were all reviewed and documented in the EPIC chart. Works at [employer]. 2 daughters both at [address], doing well. Mother hypertension and heart disease died age 63, father hypertension and prostate cancer. Numerous family members with hypertension.   ROS:  A  ROS was performed and pertinent positives and negatives are included in the history.  Exam:  Filed Vitals:   10/27/13 1556  BP: 120/80    General appearance:  Normal Head/Neck:  Normal, without cervical or supraclavicular adenopathy. Thyroid:  Symmetrical, normal in size, without palpable masses or nodularity. Respiratory  Effort:  Normal  Auscultation:  Clear without wheezing or rhonchi Cardiovascular  Auscultation:  Regular rate, without rubs, murmurs or gallops  Edema/varicosities:  Not grossly evident Abdominal  Soft,nontender, without masses, guarding or rebound.  Liver/spleen:  No organomegaly noted  Hernia:  None appreciated  Skin  Inspection:  Grossly normal  Palpation:  Grossly normal Neurologic/psychiatric  Orientation:  Normal with appropriate conversation.  Mood/affect:  Normal  Genitourinary    Breasts: Examined lying and sitting/bilateral implants, well-healed incisions.     Right: Without masses, retractions, discharge or axillary adenopathy.     Left: Without masses, retractions, discharge or axillary  adenopathy.   Inguinal/mons:  Normal without inguinal adenopathy  External genitalia:  Normal  BUS/Urethra/Skene's glands:  Normal  Bladder:  Normal  Vagina:  Normal  Cervix:  Normal  Uterus:  Enlarged/fibroids,  8-10 weeks' size nontender, Midline and mobile  Adnexa/parametria:     Rt: Without masses or tenderness.   Lt: Without masses or tenderness.  Anus and perineum: Normal  Digital rectal exam: Normal sphincter tone without palpated masses or tenderness  Assessment/Plan:  47 y.o. SBF G2P2 for annual exam.    2002 (age 51) Left breast cancer lumpectomy/chemotherapy/radiation BRCA Negative - Dr.Khan manages Hypertension-primary care manages labs and meds Multiple fibroids-  Plan: SBE's, continue annual mammogram, 3-D tomography reviewed and encouraged history of dense breasts and personal history of breast cancer. Encouraged regular exercise, calcium rich diet, vitamin D 2000 daily. Instructed to discuss with Dr.Khan recommended screening for daughters who are now early 83s. Pap. Pap normal 2012, new screening guidelines reviewed. Schedule ultrasound to assess fibroids.       Harrington Challenger [facility], 5:19 PM 10/27/2013

## 2013-10-28 ENCOUNTER — Other Ambulatory Visit: Payer: Self-pay | Admitting: Women's Health

## 2013-10-28 ENCOUNTER — Telehealth: Payer: Self-pay

## 2013-10-28 DIAGNOSIS — D219 Benign neoplasm of connective and other soft tissue, unspecified: Secondary | ICD-10-CM

## 2013-10-28 NOTE — Telephone Encounter (Signed)
Patient had called the appt desk to set up an ultrasound. She said that you had told her at visit yesterday that you would like to check her fibroids.  Appt desk said no order in system and no mention of it in your note. Please advise?

## 2013-10-28 NOTE — Telephone Encounter (Signed)
Order put in. Appt desk notified and will call her to schedule.

## 2013-10-28 NOTE — Telephone Encounter (Signed)
Yes, she does have history of fibroids, please put the order in and have her schedule. Thank you

## 2013-11-02 ENCOUNTER — Ambulatory Visit
Admission: RE | Admit: 2013-11-02 | Discharge: 2013-11-02 | Disposition: A | Discharge status: Home / Self Care | Payer: 59 | Source: Ambulatory Visit | Attending: Oncology | Admitting: Oncology

## 2013-11-02 DIAGNOSIS — Z1231 Encounter for screening mammogram for malignant neoplasm of breast: Secondary | ICD-10-CM

## 2013-11-03 ENCOUNTER — Encounter: Payer: Self-pay | Admitting: Women's Health

## 2013-11-13 ENCOUNTER — Other Ambulatory Visit: Payer: 59

## 2013-11-13 ENCOUNTER — Ambulatory Visit: Payer: 59 | Admitting: Women's Health

## 2013-11-25 ENCOUNTER — Other Ambulatory Visit: Payer: 59 | Admitting: Lab

## 2013-11-25 ENCOUNTER — Other Ambulatory Visit: Payer: 59

## 2013-11-25 ENCOUNTER — Ambulatory Visit: Payer: 59

## 2013-11-30 ENCOUNTER — Other Ambulatory Visit: Payer: Self-pay | Admitting: *Deleted

## 2013-11-30 ENCOUNTER — Other Ambulatory Visit (HOSPITAL_BASED_OUTPATIENT_CLINIC_OR_DEPARTMENT_OTHER): Payer: 59

## 2013-11-30 ENCOUNTER — Encounter: Payer: Self-pay | Admitting: Oncology

## 2013-11-30 ENCOUNTER — Telehealth: Payer: Self-pay | Admitting: Oncology

## 2013-11-30 ENCOUNTER — Ambulatory Visit (HOSPITAL_BASED_OUTPATIENT_CLINIC_OR_DEPARTMENT_OTHER): Payer: 59 | Admitting: Oncology

## 2013-11-30 VITALS — BP 136/85 | HR 83 | Temp 98.5°F | Resp 18 | Ht 60.0 in | Wt 142.4 lb

## 2013-11-30 DIAGNOSIS — Z1509 Genetic susceptibility to other malignant neoplasm: Secondary | ICD-10-CM

## 2013-11-30 DIAGNOSIS — C50919 Malignant neoplasm of unspecified site of unspecified female breast: Secondary | ICD-10-CM

## 2013-11-30 DIAGNOSIS — Z1501 Genetic susceptibility to malignant neoplasm of breast: Secondary | ICD-10-CM

## 2013-11-30 DIAGNOSIS — Z853 Personal history of malignant neoplasm of breast: Secondary | ICD-10-CM

## 2013-11-30 DIAGNOSIS — Z1371 Encounter for nonprocreative screening for genetic disease carrier status: Secondary | ICD-10-CM

## 2013-11-30 LAB — CBC WITH DIFFERENTIAL/PLATELET
BASO%: 0.8 % (ref 0.0–2.0)
Basophils Absolute: 0.1 10*3/uL (ref 0.0–0.1)
EOS ABS: 0.1 10*3/uL (ref 0.0–0.5)
EOS%: 1.1 % (ref 0.0–7.0)
HCT: 32.8 % — ABNORMAL LOW (ref 34.8–46.6)
HGB: 10.3 g/dL — ABNORMAL LOW (ref 11.6–15.9)
LYMPH%: 15.9 % (ref 14.0–49.7)
MCH: 24 pg — ABNORMAL LOW (ref 25.1–34.0)
MCHC: 31.4 g/dL — AB (ref 31.5–36.0)
MCV: 76.3 fL — ABNORMAL LOW (ref 79.5–101.0)
MONO#: 0.6 10*3/uL (ref 0.1–0.9)
MONO%: 7.9 % (ref 0.0–14.0)
NEUT%: 74.3 % (ref 38.4–76.8)
NEUTROS ABS: 5.6 10*3/uL (ref 1.5–6.5)
Platelets: 323 10*3/uL (ref 145–400)
RBC: 4.29 10*6/uL (ref 3.70–5.45)
RDW: 18.3 % — AB (ref 11.2–14.5)
WBC: 7.5 10*3/uL (ref 3.9–10.3)
lymph#: 1.2 10*3/uL (ref 0.9–3.3)

## 2013-11-30 LAB — COMPREHENSIVE METABOLIC PANEL (CC13)
ALBUMIN: 3.7 g/dL (ref 3.5–5.0)
ALK PHOS: 71 U/L (ref 40–150)
ALT: 14 U/L (ref 0–55)
AST: 16 U/L (ref 5–34)
Anion Gap: 12 mEq/L — ABNORMAL HIGH (ref 3–11)
BILIRUBIN TOTAL: 0.23 mg/dL (ref 0.20–1.20)
BUN: 14 mg/dL (ref 7.0–26.0)
CO2: 24 meq/L (ref 22–29)
Calcium: 9.5 mg/dL (ref 8.4–10.4)
Chloride: 108 mEq/L (ref 98–109)
Creatinine: 0.9 mg/dL (ref 0.6–1.1)
GLUCOSE: 94 mg/dL (ref 70–140)
POTASSIUM: 3.5 meq/L (ref 3.5–5.1)
SODIUM: 143 meq/L (ref 136–145)
TOTAL PROTEIN: 7.5 g/dL (ref 6.4–8.3)

## 2013-11-30 NOTE — Progress Notes (Signed)
OFFICE PROGRESS NOTE  CC  Tara Peers, MD 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  PRIOR THERAPY:  #1 at the age of 74 in September 2002 patient was diagnosed with a 2.5 cm grade 3 invasive ductal carcinoma of the left breast with 9 of 14 lymph nodes positive for metastatic disease. Tumor was triple-negative (ER PR negative and HER-2/neu 1+). Patient underwent a left partial mastectomy with axillary lymph node dissection by Dr. Margot Chimes initially.  #2 patient then went on to have adjuvant chemotherapy with 4 cycles of Adriamycin and Cytoxan followed by 12 weeks of weekly Taxotere by Dr. Pearlie Oyster..patient's treatment course was complicated by development of lacrimal duct stenosis from the Taxotere  #3 she then received radiation therapy to the breast axilla and supraclavicular region by Dr. Gery Pray..  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  #5 patient has been followed by Dr. Marko Plume She is now transferring her care me for ongoing follow up  [facility]ara [facility]: [facility] 48 y.o. female returns for followup visit  Clinically patient seems to be doing well. She denies any fevers chills night sweats headaches shortness of breath chest pains or palpitations no nausea or vomiting. Patient does get migraine headaches she does see a neurologist at [facility] periodically. Remainder of the 10 point review of systems is negative.  MEDICAL Bell: Past Medical Bell  Diagnosis Date  . Lymphedema of arm     DUE TO LEFT   AXILLARY NODE DISSECTION   AND RADIATION  . 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 Migraines   . Breast cancer SEPTEMBER 2002    LEFT BREAST-SURGERY,CHEMO,RADIATION-DR.LIVESAY  . BRCA2 positive  08/2009    INCONCLUSIVE (BRCA 1 WAS NEG)  . BRCA1 negative 08/2011  . Hypertension   . S/P cardiac catheterization, with normal coronary arteries and normal EF, 07/01/13 07/13/2013    ALLERGIES:  is allergic to nitrofurantoin monohyd macro.  MEDICATIONS:  Current Outpatient Prescriptions  Medication Sig Dispense Refill  . aspirin EC 81 MG EC tablet Take 1 tablet (81 mg total) by mouth daily.      Marland Kitchen ibuprofen (ADVIL,MOTRIN) 600 MG tablet Take 1 tablet (600 mg total) by mouth every 8 (eight) hours as needed for pain.  60 tablet  0  . metoprolol tartrate (LOPRESSOR) 25 MG tablet Take 0.5 tablets (12.5 mg total) by mouth 2 (two) times daily.  30 tablet  11  . nitroGLYCERIN (NITROSTAT) 0.4 MG SL tablet Place 1 tablet (0.4 mg total) under the tongue every 5 (five) minutes x 3 doses as needed for chest pain.  25 tablet  2   No current facility-administered medications for this visit.    SURGICAL Bell:  Past Surgical Bell  Procedure Laterality Date  . Bilateral tubal    . Tubal ligation      BILATERAL  . Mastectomy, partial  07/23/2001    LEFT BREAST WITH AXILLARY NODE EVALUATION  . Hysteroscopy  12/10/07    RESECTOSCOPIC POLYPECTOMY/MYOMECTOMY  . Breast lumpectomy      AE 35  . Augmentation mammaplasty  2008  . Cardiac catheterization  07/01/13    Angiographically normal coronary arteries with normal left atrial function    REVIEW OF SYSTEMS:  Pertinent  items are noted in HPI.   HEALTH MAINTENANCE:   PHYSICAL EXAMINATION: Blood pressure 136/85, pulse 83, temperature 98.5 F (36.9 C), temperature source Oral, resp. rate 18, height 5' (1.524 m), weight 142 lb 6.4 oz (64.592 kg). Body mass index is 27.81 kg/(m^2). ECOG PERFORMANCE STATUS: 0 - Asymptomatic   General appearance: alert, cooperative and appears stated age Lymph nodes: Cervical, supraclavicular, and axillary nodes normal. Resp: clear to auscultation bilaterally Cardio: regular rate and rhythm GI: soft,  non-tender; bowel sounds normal; no masses,  no organomegaly Extremities: extremities normal, atraumatic, no cyanosis or edema Neurologic: Grossly normal Bilateral breast examination: Bilateral augmentation with submuscular saline implants. Left breast no findings of recurrence could lumpectomy scar no masses nipple discharge. Right breast no masses or nipple discharge there  LABORATORY DATA: Lab Results  Component Value Date   WBC 7.5 11/30/2013   HGB 10.3* 11/30/2013   HCT 32.8* 11/30/2013   MCV 76.3* 11/30/2013   PLT 323 11/30/2013      Chemistry      Component Value Date/Time   NA 136 07/01/2013 0450   NA 138 05/12/2013 0951   K 3.6 07/01/2013 0450   K 3.8 05/12/2013 0951   CL 104 07/01/2013 0450   CL 103 11/26/2012 1054   CO2 22 07/01/2013 0450   CO2 25 05/12/2013 0951   BUN 8 07/01/2013 0450   BUN 8.6 05/12/2013 0951   CREATININE 0.77 07/01/2013 0450   CREATININE 0.8 05/12/2013 0951      Component Value Date/Time   CALCIUM 8.9 07/01/2013 0450   CALCIUM 9.2 05/12/2013 0951   ALKPHOS 64 05/12/2013 0951   ALKPHOS 54 11/27/2011 1136   AST 19 05/12/2013 0951   AST 19 11/27/2011 1136   ALT 12 05/12/2013 0951   ALT 13 11/27/2011 1136   BILITOT 0.31 05/12/2013 0951   BILITOT 0.3 11/27/2011 1136       RADIOGRAPHIC STUDIES:  No results found.  ASSESSMENT: 50 [place] female with  #1 Bell of T2 N1 (9 positive lymph nodes) invasive ductal carcinoma of the left breast diagnosed 2002. 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 She has not had any ovarian malignancies or malignancies in the contralateral breast. Patient is getting MRIs on a yearly basis as well as mammograms.     PLAN:   #1 patient and I discussed her Bell in detail today. We discussed the significance of having a triple-negative breast cancer diagnosed in 2002. We discussed the significance of being BRCA2 mutation positive.  #2 she will  continue yearly breast MRIs as well as mammograms.  #3  I will see the patient back in one year.     All questions were answered. The patient knows to call the clinic with any problems, questions or concerns. We can certainly see the patient much sooner if necessary.  I spent 20 minutes counseling the patient face to face. The total time spent in the appointment was 30 minutes.    Marcy Panning, MD Medical/Oncology [place] [facility] [phone] (beeper) [phone] (Office)  11/30/2013, 9:25 AM

## 2013-12-31 ENCOUNTER — Inpatient Hospital Stay: Admission: RE | Admit: 2013-12-31 | Payer: 59 | Source: Ambulatory Visit

## 2014-01-08 ENCOUNTER — Ambulatory Visit
Admission: RE | Admit: 2014-01-08 | Discharge: 2014-01-08 | Disposition: A | Discharge status: Home / Self Care | Payer: 59 | Source: Ambulatory Visit | Attending: Oncology | Admitting: Oncology

## 2014-01-08 DIAGNOSIS — Z1509 Genetic susceptibility to other malignant neoplasm: Principal | ICD-10-CM

## 2014-01-08 DIAGNOSIS — C50919 Malignant neoplasm of unspecified site of unspecified female breast: Secondary | ICD-10-CM

## 2014-01-08 DIAGNOSIS — Z1501 Genetic susceptibility to malignant neoplasm of breast: Secondary | ICD-10-CM

## 2014-01-08 MED ORDER — GADOBENATE DIMEGLUMINE 529 MG/ML IV SOLN
13.0000 mL | Freq: Once | INTRAVENOUS | Status: AC | PRN
  Administered 2014-01-08: 13 mL via INTRAVENOUS

## 2014-01-11 ENCOUNTER — Telehealth: Payer: Self-pay | Admitting: *Deleted

## 2014-01-11 NOTE — Telephone Encounter (Signed)
Notified patient of MRI Breast-no evidence of cancer per Dr Humphrey Rolls

## 2014-01-15 ENCOUNTER — Ambulatory Visit (INDEPENDENT_AMBULATORY_CARE_PROVIDER_SITE_OTHER): Payer: 59

## 2014-01-15 ENCOUNTER — Ambulatory Visit (INDEPENDENT_AMBULATORY_CARE_PROVIDER_SITE_OTHER): Payer: 59 | Admitting: Women's Health

## 2014-01-15 ENCOUNTER — Other Ambulatory Visit: Payer: Self-pay | Admitting: Women's Health

## 2014-01-15 ENCOUNTER — Encounter: Payer: Self-pay | Admitting: Women's Health

## 2014-01-15 DIAGNOSIS — Z853 Personal history of malignant neoplasm of breast: Secondary | ICD-10-CM

## 2014-01-15 DIAGNOSIS — D259 Leiomyoma of uterus, unspecified: Secondary | ICD-10-CM

## 2014-01-15 DIAGNOSIS — D251 Intramural leiomyoma of uterus: Secondary | ICD-10-CM

## 2014-01-15 DIAGNOSIS — N760 Acute vaginitis: Secondary | ICD-10-CM

## 2014-01-15 DIAGNOSIS — D219 Benign neoplasm of connective and other soft tissue, unspecified: Secondary | ICD-10-CM

## 2014-01-15 DIAGNOSIS — D3910 Neoplasm of uncertain behavior of unspecified ovary: Secondary | ICD-10-CM

## 2014-01-15 DIAGNOSIS — D252 Subserosal leiomyoma of uterus: Secondary | ICD-10-CM

## 2014-01-15 DIAGNOSIS — R1900 Intra-abdominal and pelvic swelling, mass and lump, unspecified site: Secondary | ICD-10-CM

## 2014-01-15 DIAGNOSIS — N839 Noninflammatory disorder of ovary, fallopian tube and broad ligament, unspecified: Secondary | ICD-10-CM

## 2014-01-15 DIAGNOSIS — A499 Bacterial infection, unspecified: Secondary | ICD-10-CM

## 2014-01-15 DIAGNOSIS — R9389 Abnormal findings on diagnostic imaging of other specified body structures: Secondary | ICD-10-CM

## 2014-01-15 DIAGNOSIS — B9689 Other specified bacterial agents as the cause of diseases classified elsewhere: Secondary | ICD-10-CM

## 2014-01-15 DIAGNOSIS — N898 Other specified noninflammatory disorders of vagina: Secondary | ICD-10-CM

## 2014-01-15 DIAGNOSIS — N83209 Unspecified ovarian cyst, unspecified side: Secondary | ICD-10-CM

## 2014-01-15 LAB — WET PREP FOR TRICH, YEAST, CLUE
Trich, Wet Prep: NONE SEEN
WBC, Wet Prep HPF POC: NONE SEEN
YEAST WET PREP: NONE SEEN

## 2014-01-15 MED ORDER — METRONIDAZOLE 500 MG PO TABS: 500.0000 mg | ORAL_TABLET | Freq: Two times a day (BID) | ORAL | Status: DC

## 2014-01-15 NOTE — Progress Notes (Signed)
Patient ID: Tara Bell, female   DOB: 1966-09-10, 48 y.o.   MRN: [id number] Presents for ultrasound and also having vaginal discharge with odor. At annual exam uterus palpated enlarged, history of fibroids. 2002 breast cancer, partial mastectomy  with chemotherapy and radiation. Struggling was lymphedema at this time. BRCA 1 negative BRCA 2 inconclusive. 2010 ultrasound showed uterus with subserous and intramural fibroids 40 x 32 mm, 11 x 10 mm, 28 x 22 mm, 11 x 8 mm, 10 x 6 mm, 11 x 9 mm. 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 Left ovary follicles. Occasional dyspareunia and left lower quadrant pain. Denies urinary symptoms. BTL  Exam: Appears well. External genitalia within normal limits, speculum exam moderate amount of menses type blood noted wet prep positive for amines, clues, and TNTC.Marland Kitchen  Ultrasound: Enlarged anteverted uterus with fibroids intramural and subserous 38 x 47 mm, 44 x 36 mm, 22 x 19 mm, 22 mm, 25 mm, pedunculated left fibroid 11 x 8.3x9.4 cm. Right ovary echo-free cyst 26 x 24 mm with negative CFD, solid cystic mass 25 x 26 mm positive CFD, .  Right and left ovaries blood flow noted. Left ovary normal.  Large uterine fibroids Right ovarian cysts Bacteria vaginosis Hx Breast cancer with BRCA 1 Negative, BRCA 2 inconclusive initially and then revised to negative-08/2013  Plan: Flagyl 500 twice daily for 7 days, alcohol precautions reviewed, instructed to call no relief of discharge. Handout given on both fibroids and hysterectomy. Prefers no surgery. Repeat ultrasound in 2 months to check for resolution of cysts. Ultrasounds reviewed with Dr. Toney Rakes, CA 125, preop visit for possible surgery when ready, pending CA 125 negative.

## 2014-01-15 NOTE — Patient Instructions (Signed)
Bacterial Vaginosis Bacterial vaginosis is an infection of the vagina. It happens when too many of certain germs (bacteria) grow in the vagina. HOME CARE  Take your medicine as told by your doctor.  Finish your medicine even if you start to feel better.  Do not have sex until you finish your medicine and are better.  Tell your sex partner that you have an infection. They should see their doctor for treatment.  Practice safe sex. Use condoms. Have only one sex partner. GET HELP IF:  You are not getting better after 3 days of treatment.  You have more grey fluid (discharge) coming from your vagina than before.  You have more pain than before.  You have a fever. MAKE SURE YOU:   Understand these instructions.  Will watch your condition.  Will get help right away if you are not doing well or get worse. Document Released: 07/31/2008 Document Revised: 08/12/2013 Document Reviewed: 06/03/2013 [employer] Patient Information 2014 [employer].  

## 2014-01-17 ENCOUNTER — Encounter: Payer: Self-pay | Admitting: Women's Health

## 2014-01-18 ENCOUNTER — Other Ambulatory Visit: Payer: Self-pay | Admitting: Women's Health

## 2014-01-18 ENCOUNTER — Encounter: Payer: Self-pay | Admitting: Women's Health

## 2014-01-19 MED ORDER — IBUPROFEN 600 MG PO TABS: 600.0000 mg | ORAL_TABLET | Freq: Three times a day (TID) | ORAL | Status: DC | PRN

## 2014-01-20 ENCOUNTER — Telehealth: Payer: Self-pay | Admitting: *Deleted

## 2014-01-20 ENCOUNTER — Telehealth: Payer: Self-pay | Admitting: Oncology

## 2014-01-20 NOTE — Telephone Encounter (Signed)
Spoke to patient regarding her concern for the ovarian cyst and fibroid that was found by her gynecologist. She tells me that she had a CA 125 performed by the gynecologist and was 81. Patient has been advised for possible removal of the fibroid and cyst. Patient is concerned could this be a cancer. Patient does have a personal history of breast cancer. She has had genetic testing in the past. Patient was prior patient of Dr. Marko Plume who had discussed the possibility of having double mastectomy and oophorectomy in light of the parotid testing. At that time patient declined.  I have advised today that patient should keep her appointment with her gynecologist and may want to consider oophorectomies if recommended by the gynecologist since she is at risk for developing ovarian cancer due to the BRCA2.  Patient demonstrated understanding. She was encouraged to call with any questions or concerns.

## 2014-01-20 NOTE — Telephone Encounter (Signed)
Spoke to patient and a note is dictated

## 2014-01-20 NOTE — Telephone Encounter (Signed)
Received voice mail from patient that her OB/GYN did an Ultrasound for fibroids but found a cyst on her right ovary. Patient stated," the doctor drew some labs and the CA 125 came back at 76. The doctor recommends the ovaries and fibroids be removed. Is this cancer? Has Dr. Humphrey Rolls checked me for this before?" Return number is [phone].

## 2014-01-26 ENCOUNTER — Encounter: Payer: Self-pay | Admitting: Gynecology

## 2014-01-26 ENCOUNTER — Ambulatory Visit (INDEPENDENT_AMBULATORY_CARE_PROVIDER_SITE_OTHER): Payer: 59 | Admitting: Gynecology

## 2014-01-26 VITALS — BP 136/88

## 2014-01-26 DIAGNOSIS — D259 Leiomyoma of uterus, unspecified: Secondary | ICD-10-CM | POA: Insufficient documentation

## 2014-01-26 DIAGNOSIS — N83201 Unspecified ovarian cyst, right side: Secondary | ICD-10-CM

## 2014-01-26 DIAGNOSIS — N83209 Unspecified ovarian cyst, unspecified side: Secondary | ICD-10-CM

## 2014-01-26 NOTE — Progress Notes (Signed)
   Patient presented to the office today with her husband to discuss her recent ultrasound. This has been ordered due to phaco time of her annual exam her uterus felt enlarged. Review of her record and indicated that in 2002 she had an ultrasound that demonstrated that she had several fibroids as well as follows:  ultrasound showed uterus with subserous and intramural fibroids 40 x 32 mm, 11 x 10 mm, 28 x 22 mm, 11 x 8 mm, 10 x 6 mm, 11 x 9 mm. 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 Left ovary follicles.   Her ultrasound done here in the office on March 13 demonstrated the following: Enlarged anteverted uterus with fibroids intramural and subserous 38 x 47 mm, 44 x 36 mm, 22 x 19 mm, 22 mm, 25 mm, pedunculated left fibroid 11 x 8.3x9.4 cm. Right ovary echo-free cyst 26 x 24 mm with negative CFD, solid cystic mass 25 x 26 mm positive CFD, . Right and left ovaries blood flow noted. Left ovary normal.  CA1 25 was normal Hx Breast cancer with BRCA 1 Negative, BRCA 2 inconclusive initially and then revised to negative-08/2013 2002 breast cancer, partial mastectomy with chemotherapy and radiation.  Exam today: Abdomen: Fundal height to approximately 3 fingerbreadths underneath the umbilicus Pelvic exam: Bartholin urethra Skene was within normal limits Vagina: No lesions or discharge Cervix: No lesions or discharge Bimanual exam 10-12 week size uterus mobile nontender irregular shaped Adnexa: Difficult to assess due to the size of the uterus Rectal exam unremarkable  Assessment/plan: Patient with enlarging leiomyomatous uteri with minimal symptoms. Patient with past history of breast cancer negative BRCA1 and BRCA2 gene mutation. Ultrasound recently demonstrating an echo-free cyst measuring 26 x 24 mm with negative color flow along with a solid cystic mass measuring 25 x 26 mm and positive color flow Doppler. Left ovary was normal. Normal CA125.  I  raised my concerns over the enlarging fibroid and this cyst although the CA 125 was normal. I have recommended an abdominal hysterectomy with right salpingo-oophorectomy and left salpingectomy. I did discuss with her uterine artery embolization but with a rapidly growing uterus makes me nervous for possible leiomyosarcoma and also the cyst with her history of breast cancer. I provided her with literature information on hysterectomy as well as on fibroids. Pictures were shown. Details of the operation discussed as well as other alternatives which she does not qualify for such as myomectomy and long-term treatment with GnRH analog. She is perimenopausal and her cycles are every 2-3 months. She denies any vasomotor symptoms. She will review the information I have provided and contact the office if she would like to proceed with hysterectomy. Last Pap smear 2014 with normal as was her mammogram. If she does not want to proceed with surgery I would recommend an ultrasound in 3 months for followup on the right ovarian cyst.

## 2014-01-26 NOTE — Patient Instructions (Signed)
Hysterectomy Information  A hysterectomy is a surgery in which your uterus is removed. This surgery may be done to treat various medical problems. After the surgery, you will no longer have menstrual periods. The surgery will also make you unable to become pregnant (sterile). The fallopian tubes and ovaries can be removed (bilateral salpingo-oophorectomy) during this surgery as well.  REASONS FOR A HYSTERECTOMY  Persistent, abnormal bleeding.  Lasting (chronic) pelvic pain or infection.  The lining of the uterus (endometrium) starts growing outside the uterus (endometriosis).  The endometrium starts growing in the muscle of the uterus (adenomyosis).  The uterus falls down into the vagina (pelvic organ prolapse).  Noncancerous growths in the uterus (uterine fibroids) that cause symptoms.  Precancerous cells.  Cervical cancer or uterine cancer. TYPES OF HYSTERECTOMIES  Supracervical hysterectomy In this type, the top part of the uterus is removed, but not the cervix.  Total hysterectomy The uterus and cervix are removed.  Radical hysterectomy The uterus, the cervix, and the fibrous tissue that holds the uterus in place in the pelvis (parametrium) are removed. WAYS A HYSTERECTOMY CAN BE PERFORMED  Abdominal hysterectomy A large surgical cut (incision) is made in the abdomen. The uterus is removed through this incision.  Vaginal hysterectomy An incision is made in the vagina. The uterus is removed through this incision. There are no abdominal incisions.  Conventional laparoscopic hysterectomy Three or four small incisions are made in the abdomen. A thin, lighted tube with a camera (laparoscope) is inserted into one of the incisions. Other tools are put through the other incisions. The uterus is cut into small pieces. The small pieces are removed through the incisions, or they are removed through the vagina.  Laparoscopically assisted vaginal hysterectomy (LAVH) Three or four small  incisions are made in the abdomen. Part of the surgery is performed laparoscopically and part vaginally. The uterus is removed through the vagina.  Robot-assisted laparoscopic hysterectomy A laparoscope and other tools are inserted into 3 or 4 small incisions in the abdomen. A computer-controlled device is used to give the surgeon a 3D image and to help control the surgical instruments. This allows for more precise movements of surgical instruments. The uterus is cut into small pieces and removed through the incisions or removed through the vagina. RISKS AND COMPLICATIONS  Possible complications associated with this procedure include:  Bleeding and risk of blood transfusion. Tell your health care provider if you do not want to receive any blood products.  Blood clots in the legs or lung.  Infection.  Injury to surrounding organs.  Problems or side effects related to anesthesia.  Conversion to an abdominal hysterectomy from one of the other techniques. WHAT TO EXPECT AFTER A HYSTERECTOMY  You will be given pain medicine.  You will need to have someone with you for the first 3 5 days after you go home.  You will need to follow up with your surgeon in 2 4 weeks after surgery to evaluate your progress.  You may have early menopause symptoms such as hot flashes, night sweats, and insomnia.  If you had a hysterectomy for a problem that was not cancer or not a condition that could lead to cancer, then you no longer need Pap tests. However, even if you no longer need a Pap test, a regular exam is a good idea to make sure no other problems are starting. Document Released: 04/17/2001 Document Revised: 08/12/2013 Document Reviewed: 06/29/2013 [facility] Patient Information 2014 [place]. Fibroids Fibroids are lumps (tumors)  that can occur any place in a woman's body. These lumps are not cancerous. Fibroids vary in size, weight, and where they grow. HOME CARE  Do not take aspirin.  Write  down the number of pads or tampons you use during your period. Tell your doctor. This can help determine the best treatment for you. GET HELP RIGHT AWAY IF:  You have pain in your lower belly (abdomen) that is not helped with medicine.  You have cramps that are not helped with medicine.  You have more bleeding between or during your period.  You feel lightheaded or pass out (faint).  Your lower belly pain gets worse. MAKE SURE YOU:  Understand these instructions.  Will watch your condition.  Will get help right away if you are not doing well or get worse. Document Released: 11/24/2010 Document Revised: 01/14/2012 Document Reviewed: 11/24/2010 [facility] Patient Information 2014 [place], [place]. Ovarian Cyst An ovarian cyst is a fluid-filled sac that forms on an ovary. The ovaries are small organs that produce eggs in women. Various types of cysts can form on the ovaries. Most are not cancerous. Many do not cause problems, and they often go away on their own. Some may cause symptoms and require treatment. Common types of ovarian cysts include:  Functional cysts These cysts may occur every month during the menstrual cycle. This is normal. The cysts usually go away with the next menstrual cycle if the woman does not get pregnant. Usually, there are no symptoms with a functional cyst.  Endometrioma cysts These cysts form from the tissue that lines the uterus. They are also called "chocolate cysts" because they become filled with blood that turns brown. This type of cyst can cause pain in the lower abdomen during intercourse and with your menstrual period.  Cystadenoma cysts This type develops from the cells on the outside of the ovary. These cysts can get very big and cause lower abdomen pain and pain with intercourse. This type of cyst can twist on itself, cut off its blood supply, and cause severe pain. It can also easily rupture and cause a lot of pain.  Dermoid cysts This type of cyst is  sometimes found in both ovaries. These cysts may contain different kinds of body tissue, such as skin, teeth, hair, or cartilage. They usually do not cause symptoms unless they get very big.  Theca lutein cysts These cysts occur when too much of a certain hormone (human chorionic gonadotropin) is produced and overstimulates the ovaries to produce an egg. This is most common after procedures used to assist with the conception of a baby (in vitro fertilization). CAUSES   Fertility drugs can cause a condition in which multiple large cysts are formed on the ovaries. This is called ovarian hyperstimulation syndrome.  A condition called polycystic ovary syndrome can cause hormonal imbalances that can lead to nonfunctional ovarian cysts. SIGNS AND SYMPTOMS  Many ovarian cysts do not cause symptoms. If symptoms are present, they may include:  Pelvic pain or pressure.  Pain in the lower abdomen.  Pain during sexual intercourse.  Increasing girth (swelling) of the abdomen.  Abnormal menstrual periods.  Increasing pain with menstrual periods.  Stopping having menstrual periods without being pregnant. DIAGNOSIS  These cysts are commonly found during a routine or annual pelvic exam. Tests may be ordered to find out more about the cyst. These tests may include:  Ultrasound.  X-ray of the pelvis.  CT scan.  MRI.  Blood tests. TREATMENT  Many ovarian cysts  go away on their own without treatment. Your health care provider may want to check your cyst regularly for 2 3 months to see if it changes. For women in menopause, it is particularly important to monitor a cyst closely because of the higher rate of ovarian cancer in menopausal women. When treatment is needed, it may include any of the following:  A procedure to drain the cyst (aspiration). This may be done using a long needle and ultrasound. It can also be done through a laparoscopic procedure. This involves using a thin, lighted tube with  a tiny camera on the end (laparoscope) inserted through a small incision.  Surgery to remove the whole cyst. This may be done using laparoscopic surgery or an open surgery involving a larger incision in the lower abdomen.  Hormone treatment or birth control pills. These methods are sometimes used to help dissolve a cyst. HOME CARE INSTRUCTIONS   Only take over-the-counter or prescription medicines as directed by your health care provider.  Follow up with your health care provider as directed.  Get regular pelvic exams and Pap tests. SEEK MEDICAL CARE IF:   Your periods are late, irregular, or painful, or they stop.  Your pelvic pain or abdominal pain does not go away.  Your abdomen becomes larger or swollen.  You have pressure on your bladder or trouble emptying your bladder completely.  You have pain during sexual intercourse.  You have feelings of fullness, pressure, or discomfort in your stomach.  You lose weight for no apparent reason.  You feel generally ill.  You become constipated.  You lose your appetite.  You develop acne.  You have an increase in body and facial hair.  You are gaining weight, without changing your exercise and eating habits.  You think you are pregnant. SEEK IMMEDIATE MEDICAL CARE IF:   You have increasing abdominal pain.  You feel sick to your stomach (nauseous), and you throw up (vomit).  You develop a fever that comes on suddenly.  You have abdominal pain during a bowel movement.  Your menstrual periods become heavier than usual. Document Released: 10/22/2005 Document Revised: 08/12/2013 Document Reviewed: 06/29/2013 [facility] Patient Information 2014 [place].

## 2014-01-28 ENCOUNTER — Telehealth: Payer: Self-pay

## 2014-01-28 NOTE — Telephone Encounter (Signed)
No, imaging studies point to fibroids but if malignant (due to rapid growth) the only way is for pathological evaluation when it is all taken out.

## 2014-01-28 NOTE — Telephone Encounter (Signed)
Patient informed. She  Said she would like to make appt to come back in and talk with Dr. Moshe Salisbury some more. Consult scheduled.

## 2014-01-28 NOTE — Telephone Encounter (Signed)
Patient said you have recommended hysterectomy.  She is asking is surgery the only way to find out if she has something cancerous. Can a biopsy be done to find out it is cancer without having to have surgery?

## 2014-01-29 ENCOUNTER — Telehealth: Payer: Self-pay | Admitting: Genetic Counselor

## 2014-01-29 NOTE — Telephone Encounter (Signed)
Patient had called and left message about questions she had regarding her genetic test results.  I called back and left a message that I had tried to reach her and to please call back.

## 2014-01-29 NOTE — Telephone Encounter (Signed)
Patient left message that she had questions about her test results.  I called back and left a message that I had tried to reach her and to please call back.

## 2014-02-02 NOTE — Progress Notes (Unsigned)
Subjective:     Patient ID: Tara Bell, female   DOB: 12-31-1965, 48 y.o.   MRN: [id number]  HPI   Review of Systems     Objective:   Physical Exam     Assessment:     ***    Plan:     ***     error

## 2014-02-02 NOTE — Progress Notes (Unsigned)
Per Christ Kick., NP she spoke with patient and patient wants to wait and recheck u/s.  Appt has been scheduled for May and patient will see [place] after.  I will wait to hear after the u/s about patient desire for surgery.

## 2014-02-03 ENCOUNTER — Ambulatory Visit: Payer: 59 | Admitting: Gynecology

## 2014-03-01 ENCOUNTER — Ambulatory Visit: Payer: 59 | Admitting: Cardiology

## 2014-03-09 ENCOUNTER — Other Ambulatory Visit: Payer: Self-pay | Admitting: Women's Health

## 2014-03-09 MED ORDER — METRONIDAZOLE 0.75 % VA GEL: VAGINAL | Status: DC

## 2014-03-09 NOTE — Progress Notes (Signed)
Telephone call from patient stating BV has returned, same partner, had been using one applicator of MetroGel after cycle each month with good relief of symptoms, Rx called in, instructed to call if no relief.

## 2014-03-11 ENCOUNTER — Ambulatory Visit: Payer: 59 | Admitting: Cardiology

## 2014-03-17 ENCOUNTER — Ambulatory Visit: Payer: 59 | Admitting: Women's Health

## 2014-03-17 ENCOUNTER — Other Ambulatory Visit: Payer: 59

## 2014-03-26 ENCOUNTER — Ambulatory Visit (INDEPENDENT_AMBULATORY_CARE_PROVIDER_SITE_OTHER): Payer: 59 | Admitting: Women's Health

## 2014-03-26 ENCOUNTER — Other Ambulatory Visit: Payer: Self-pay | Admitting: Women's Health

## 2014-03-26 ENCOUNTER — Ambulatory Visit (INDEPENDENT_AMBULATORY_CARE_PROVIDER_SITE_OTHER): Payer: 59

## 2014-03-26 ENCOUNTER — Encounter: Payer: Self-pay | Admitting: Women's Health

## 2014-03-26 DIAGNOSIS — D219 Benign neoplasm of connective and other soft tissue, unspecified: Secondary | ICD-10-CM

## 2014-03-26 DIAGNOSIS — N852 Hypertrophy of uterus: Secondary | ICD-10-CM

## 2014-03-26 DIAGNOSIS — D259 Leiomyoma of uterus, unspecified: Secondary | ICD-10-CM

## 2014-03-26 DIAGNOSIS — N83209 Unspecified ovarian cyst, unspecified side: Secondary | ICD-10-CM

## 2014-03-26 DIAGNOSIS — Z853 Personal history of malignant neoplasm of breast: Secondary | ICD-10-CM

## 2014-03-26 NOTE — Progress Notes (Signed)
Patient ID: Tara Bell, female   DOB: 06-13-1966, 48 y.o.   MRN: [id number] Presents for followup ultrasound. History of fibroids, 11cm left pedunculated fibroid, 5 fibroids 2-5 cm, and right ovarian echo-free negative CFD cyst 26 x 32mm,  solid cystic cyst 25 x 26 mm with positive CFD. CA 125  -  56. History of left breast cancer.   Ultrasound: Multiple fibroids seen. 29 x 24 mm, 21 x 19 mm, 26 x 21 mm, 29 x 14 mm, 22 x 19 mm, 41 x 37 mm, 40 x 29 mm, 105 x 70 mm, ovaries appear normal with follicle on left ovary. No visible masses, no free fluid.  Fibroids uterus Resolved ovarian cyst  Plan: Repeat CA 125. At this time would prefer not to have a hysterectomy, will call if symptoms or problems.

## 2014-04-12 ENCOUNTER — Other Ambulatory Visit: Payer: Self-pay

## 2014-04-12 MED ORDER — VALACYCLOVIR HCL 500 MG PO TABS: 500.0000 mg | ORAL_TABLET | Freq: Two times a day (BID) | ORAL | Status: DC

## 2014-04-19 ENCOUNTER — Ambulatory Visit (INDEPENDENT_AMBULATORY_CARE_PROVIDER_SITE_OTHER): Payer: 59 | Admitting: Cardiology

## 2014-04-19 ENCOUNTER — Encounter: Payer: Self-pay | Admitting: Cardiology

## 2014-04-19 ENCOUNTER — Ambulatory Visit: Payer: 59 | Admitting: Cardiology

## 2014-04-19 VITALS — BP 142/88 | HR 83 | Ht 60.0 in | Wt 144.5 lb

## 2014-04-19 DIAGNOSIS — R238 Other skin changes: Secondary | ICD-10-CM

## 2014-04-19 DIAGNOSIS — I10 Essential (primary) hypertension: Secondary | ICD-10-CM

## 2014-04-19 DIAGNOSIS — R002 Palpitations: Secondary | ICD-10-CM

## 2014-04-19 DIAGNOSIS — E785 Hyperlipidemia, unspecified: Secondary | ICD-10-CM

## 2014-04-19 DIAGNOSIS — R233 Spontaneous ecchymoses: Secondary | ICD-10-CM

## 2014-04-19 MED ORDER — METOPROLOL TARTRATE 25 MG PO TABS: 12.5000 mg | ORAL_TABLET | Freq: Two times a day (BID) | ORAL | Status: AC

## 2014-04-19 NOTE — Progress Notes (Signed)
04/19/2014   PCP: Elyn Peers, MD   Chief Complaint  Patient presents with  . ROV 6 months    C/o 1 episode of chest pain yesterday-possibly from cleaning supplies with bleach, slight swelling in legs, bruising on thighs.    Primary Cardiologist: Dr. Ellyn Hack  HPI: 48 year old African American female presents today for second posthospitalization followup. I saw her back in October of 2014 she's doing relatively well.   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 Her symptoms likely secondary to hypertensive urgency as opposed to acute coronary syndrome.  Interval History: She continues to do very well and was staying active. She does occasionally note some short runs of rapid heartbeats the last several seconds. She said that about twice since October. Last week, while cleaning her bathroom with Clorox she felt some tightness in her chest and dizzy with headache. She felt as though she was suffocating. Otherwise, she denies any chest tightness or pressure with rest or exertion. No exertional dyspnea. No PND, orthopnea or edema.  She denies any rapid heartbeats or palpitations/arrhythmias. No lightheadedness, dizziness, wooziness or syncope/ near syncope. No TIA or amaurosis fugax symptoms. No melena, hematochezia or hematuria. No claudication symptoms.   Allergies  Allergen Reactions  . Nitrofurantoin Monohyd Macro Nausea And Vomiting    Current Outpatient Prescriptions  Medication Sig Dispense Refill  . aspirin EC 81 MG EC tablet Take 1 tablet (81 mg total) by mouth daily.      Marland Kitchen ibuprofen (ADVIL,MOTRIN) 600 MG tablet Take 1 tablet (600 mg total) by mouth every 8 (eight) hours as needed.  60 tablet  1  . losartan-hydrochlorothiazide (HYZAAR) 50-12.5 MG per tablet Take 1 tablet by mouth daily.      . metoprolol tartrate (LOPRESSOR) 25 MG tablet Take 0.5  tablets (12.5 mg total) by mouth 2 (two) times daily.  30 tablet  11  . nitroGLYCERIN (NITROSTAT) 0.4 MG SL tablet Place 1 tablet (0.4 mg total) under the tongue every 5 (five) minutes x 3 doses as needed for chest pain.  25 tablet  2  . valACYclovir (VALTREX) 500 MG tablet Take 500 mg by mouth 2 (two) times daily as needed.       No current facility-administered medications for this visit.    Past Medical History  Diagnosis Date  . Lymphedema of arm     DUE TO LEFT   AXILLARY NODE DISSECTION   AND RADIATION  . 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 Migraines   . Breast cancer SEPTEMBER 2002    LEFT BREAST-SURGERY,CHEMO,RADIATION-DR.LIVESAY  . 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 BRCA1 negative 08/2011  . Hypertension   . S/P cardiac catheterization, with normal coronary arteries and normal EF, 07/01/13 07/13/2013    Past Cardiac History  Procedure Laterality Date  .  transthoracic echocardiogram       No cavity size. Normal EF 55-60%. Gr1 DD, otherwise normal    07/01/2013   . Cardiac catheterization  07/01/13    Angiographically normal coronary arteries with normal left atrial function   Review of Systems  Constitutional: Negative.   Eyes: Negative for blurred vision.  Cardiovascular: Negative.  Negative for chest pain, palpitations, orthopnea, claudication, leg swelling and PND.  Gastrointestinal: Negative.  Negative for blood in stool and melena.  Genitourinary: Negative.  Negative for hematuria.  Musculoskeletal: Negative.   Skin: Negative.   Neurological: Positive for dizziness and headaches.       Has dizziness with blurring of vision as prodrom for migraine HA  Endo/Heme/Allergies: Bruises/bleeds easily.  All other systems reviewed and are negative.  PHYSICAL EXAM BP 142/88  Pulse 83  Ht 5' (1.524 m)  Wt 144 lb 8 oz (65.545 kg)  BMI 28.22 kg/m2 General:Pleasant affect, NAD Skin:Warm and dry, brisk capillary  refill HEENT:normocephalic, sclera clear, mucus membranes moist Heart:S1S2 RRR without M./R./G. Lungs: CTAP, nonlabored. No rales, rhonchi, or wheezes TJL:LVDI, non tender, + BS, do not palpate liver spleen or masses Ext:no lower ext edema, 2+ pedal pulses, 2+ radial pulses, no problems at radial cath site. Neuro:alert and oriented x 3;, grossly intact  In the 1 ASSESSMENT AND PLAN Essentially stable from cardiac standpoint. No active issues. She can followup when necessary.  Hypertension Remains borderline controlled on ARB/HCTZ plus low-dose beta blocker. We'll refill her beta blocker. I will defer further titration of her antihypertensive regimen to her PCP. I would suspect that her blood pressures continue to stay borderline elevated she probably need to increase her dose of ARB/HCTZ versus beta blocker.  Hyperlipidemia LDL goal < 100, normal coronary arteries. Her last labs we checked for asking me. Images taken in by her PCP recently and will be totally normal. I agree with not having her on statins based on her age a pen how well her lipids have been controlled.  Palpitations She is having minimal episodes of palpitations, they seemed more likely to be ectopic beats versus an arrhythmia. I told her that if she does have spells where her heart is fast for more than several minutes, she can take an additional half dose of her metoprolol..  Easy bruising She notes that she is in bruising some on her legs when she does things are slightly crosses her legs. I told her that she notes that at times she is bruising more, she can hold aspirin for several days to allow to heal. If it does not get better or should he has a bruise off of aspirin, I recommended that she sees her PCP did have an evaluation for possible hematologic disorder.    Followup when necessary.   Leonie Man, M.D., M.S. [facility] [id number] [place], [place]   [id number]  [phone] Pager # [phone] 04/19/2014 9:21 PM

## 2014-04-19 NOTE — Patient Instructions (Addendum)
Your physician recommends that you schedule a follow-up appointment as needed.  If bruising still occurs you may stop Aspirin.   Have primary filled you METOPROLOL after this 12 months.

## 2014-04-19 NOTE — Assessment & Plan Note (Signed)
Her last labs we checked for asking me. Images taken in by her PCP recently and will be totally normal. I agree with not having her on statins based on her age a pen how well her lipids have been controlled.

## 2014-04-19 NOTE — Assessment & Plan Note (Signed)
She is having minimal episodes of palpitations, they seemed more likely to be ectopic beats versus an arrhythmia. I told her that if she does have spells where her heart is fast for more than several minutes, she can take an additional half dose of her metoprolol.Tara Bell

## 2014-04-19 NOTE — Assessment & Plan Note (Signed)
She notes that she is in bruising some on her legs when she does things are slightly crosses her legs. I told her that she notes that at times she is bruising more, she can hold aspirin for several days to allow to heal. If it does not get better or should he has a bruise off of aspirin, I recommended that she sees her PCP did have an evaluation for possible hematologic disorder.

## 2014-04-19 NOTE — Assessment & Plan Note (Signed)
Remains borderline controlled on ARB/HCTZ plus low-dose beta blocker. We'll refill her beta blocker. I will defer further titration of her antihypertensive regimen to her PCP. I would suspect that her blood pressures continue to stay borderline elevated she probably need to increase her dose of ARB/HCTZ versus beta blocker.

## 2014-07-11 ENCOUNTER — Other Ambulatory Visit (HOSPITAL_COMMUNITY): Payer: Self-pay | Admitting: Cardiology

## 2014-07-13 MED ORDER — NITROGLYCERIN 0.4 MG SL SUBL: 0.4000 mg | SUBLINGUAL_TABLET | SUBLINGUAL | Status: DC | PRN

## 2014-07-13 NOTE — Telephone Encounter (Signed)
Rx was sent to pharmacy electronically. 

## 2014-07-13 NOTE — Addendum Note (Signed)
Addended by: Diana Eves on: 07/13/2014 05:56 PM   Modules accepted: Orders

## 2014-07-13 NOTE — Addendum Note (Signed)
Addended by: Diana Eves on: 07/13/2014 05:57 PM   Modules accepted: Orders

## 2014-07-31 ENCOUNTER — Telehealth: Payer: Self-pay | Admitting: Hematology and Oncology

## 2014-07-31 NOTE — Telephone Encounter (Signed)
Lft msg for pt regarding labs/ov and MD change, mailed sch........ KJ °

## 2014-08-04 ENCOUNTER — Other Ambulatory Visit: Payer: Self-pay

## 2014-08-04 ENCOUNTER — Other Ambulatory Visit: Payer: Self-pay | Admitting: Hematology and Oncology

## 2014-08-04 DIAGNOSIS — Z1231 Encounter for screening mammogram for malignant neoplasm of breast: Secondary | ICD-10-CM

## 2014-08-05 ENCOUNTER — Other Ambulatory Visit: Payer: Self-pay

## 2014-08-05 DIAGNOSIS — C50912 Malignant neoplasm of unspecified site of left female breast: Secondary | ICD-10-CM

## 2014-08-05 DIAGNOSIS — Z1501 Genetic susceptibility to malignant neoplasm of breast: Secondary | ICD-10-CM

## 2014-08-05 DIAGNOSIS — Z1509 Genetic susceptibility to other malignant neoplasm: Secondary | ICD-10-CM

## 2014-08-06 ENCOUNTER — Telehealth: Payer: Self-pay | Admitting: *Deleted

## 2014-08-06 NOTE — Telephone Encounter (Signed)
Tara Bell called and left VM stating that she was a prior Dr. Marko Plume Tara Bell and that she would like to start seeing her again. Per Dr. Marko Plume, that is okay. Information passed along to Arsenio Loader who will reschedule pt with Dr. Edwyna Shell. Called Tara Bell and let her know we got her message and that someone will call her to reschedule with Dr. Marko Plume. Pt appreciative of call back.

## 2014-08-09 ENCOUNTER — Telehealth: Payer: Self-pay | Admitting: *Deleted

## 2014-08-09 NOTE — Telephone Encounter (Signed)
Called to change pt from Dr. Lindi Adie to Dr. Marko Plume and she could not talk right now.  Requested for me to call her at another number, but it was disconnected.  Will try again another day.

## 2014-08-10 ENCOUNTER — Telehealth: Payer: Self-pay | Admitting: Oncology

## 2014-08-10 ENCOUNTER — Telehealth: Payer: Self-pay | Admitting: *Deleted

## 2014-08-10 NOTE — Telephone Encounter (Signed)
Spoke with pt and she has been scheduled to see Dr. Marko Plume on 09/01/14.  Cancelled Dr. Geralyn Flash appts and emailed Dr. Marko Plume to make her aware.

## 2014-08-10 NOTE — Telephone Encounter (Signed)
returned pt call adn lvm for pt with new appts...[facility] staff msge ok to take over pt care

## 2014-08-29 ENCOUNTER — Other Ambulatory Visit: Payer: Self-pay | Admitting: Oncology

## 2014-08-29 DIAGNOSIS — C50912 Malignant neoplasm of unspecified site of left female breast: Secondary | ICD-10-CM

## 2014-08-31 ENCOUNTER — Other Ambulatory Visit: Payer: Self-pay | Admitting: Oncology

## 2014-08-31 ENCOUNTER — Telehealth: Payer: Self-pay | Admitting: *Deleted

## 2014-08-31 DIAGNOSIS — D508 Other iron deficiency anemias: Secondary | ICD-10-CM

## 2014-08-31 NOTE — Telephone Encounter (Signed)
Pt left VM asking if Dr. Marko Plume was going to draw an iron level tomorrow at her appt. Dr. Marko Plume placed lab orders for iron and TIBC. Called pt and left her a VM letting her know that iron level will be drawn tomorrow.

## 2014-09-01 ENCOUNTER — Other Ambulatory Visit (HOSPITAL_BASED_OUTPATIENT_CLINIC_OR_DEPARTMENT_OTHER): Payer: 59

## 2014-09-01 ENCOUNTER — Ambulatory Visit (HOSPITAL_BASED_OUTPATIENT_CLINIC_OR_DEPARTMENT_OTHER): Payer: 59 | Admitting: Oncology

## 2014-09-01 VITALS — BP 139/87 | HR 87 | Temp 98.2°F | Resp 18 | Ht 60.0 in | Wt 146.1 lb

## 2014-09-01 DIAGNOSIS — N920 Excessive and frequent menstruation with regular cycle: Secondary | ICD-10-CM

## 2014-09-01 DIAGNOSIS — D251 Intramural leiomyoma of uterus: Secondary | ICD-10-CM

## 2014-09-01 DIAGNOSIS — H04559 Acquired stenosis of unspecified nasolacrimal duct: Secondary | ICD-10-CM

## 2014-09-01 DIAGNOSIS — D509 Iron deficiency anemia, unspecified: Secondary | ICD-10-CM

## 2014-09-01 DIAGNOSIS — M25562 Pain in left knee: Secondary | ICD-10-CM

## 2014-09-01 DIAGNOSIS — D508 Other iron deficiency anemias: Secondary | ICD-10-CM

## 2014-09-01 DIAGNOSIS — Z853 Personal history of malignant neoplasm of breast: Secondary | ICD-10-CM

## 2014-09-01 DIAGNOSIS — C50912 Malignant neoplasm of unspecified site of left female breast: Secondary | ICD-10-CM

## 2014-09-01 DIAGNOSIS — M79662 Pain in left lower leg: Secondary | ICD-10-CM

## 2014-09-01 LAB — CBC WITH DIFFERENTIAL/PLATELET
BASO%: 0.2 % (ref 0.0–2.0)
Basophils Absolute: 0 10*3/uL (ref 0.0–0.1)
EOS%: 1.5 % (ref 0.0–7.0)
Eosinophils Absolute: 0.1 10*3/uL (ref 0.0–0.5)
HCT: 30.5 % — ABNORMAL LOW (ref 34.8–46.6)
HGB: 9.3 g/dL — ABNORMAL LOW (ref 11.6–15.9)
LYMPH%: 25.5 % (ref 14.0–49.7)
MCH: 22.4 pg — ABNORMAL LOW (ref 25.1–34.0)
MCHC: 30.5 g/dL — ABNORMAL LOW (ref 31.5–36.0)
MCV: 73.5 fL — AB (ref 79.5–101.0)
MONO#: 0.4 10*3/uL (ref 0.1–0.9)
MONO%: 6.2 % (ref 0.0–14.0)
NEUT%: 66.6 % (ref 38.4–76.8)
NEUTROS ABS: 4 10*3/uL (ref 1.5–6.5)
PLATELETS: 305 10*3/uL (ref 145–400)
RBC: 4.15 10*6/uL (ref 3.70–5.45)
RDW: 17.6 % — ABNORMAL HIGH (ref 11.2–14.5)
WBC: 6 10*3/uL (ref 3.9–10.3)
lymph#: 1.5 10*3/uL (ref 0.9–3.3)

## 2014-09-01 LAB — COMPREHENSIVE METABOLIC PANEL (CC13)
ALT: 12 U/L (ref 0–55)
ANION GAP: 9 meq/L (ref 3–11)
AST: 17 U/L (ref 5–34)
Albumin: 3.5 g/dL (ref 3.5–5.0)
Alkaline Phosphatase: 63 U/L (ref 40–150)
BILIRUBIN TOTAL: 0.2 mg/dL (ref 0.20–1.20)
BUN: 11 mg/dL (ref 7.0–26.0)
CALCIUM: 9.5 mg/dL (ref 8.4–10.4)
CHLORIDE: 108 meq/L (ref 98–109)
CO2: 24 meq/L (ref 22–29)
Creatinine: 0.9 mg/dL (ref 0.6–1.1)
Glucose: 109 mg/dl (ref 70–140)
Potassium: 3.6 mEq/L (ref 3.5–5.1)
SODIUM: 141 meq/L (ref 136–145)
TOTAL PROTEIN: 7.1 g/dL (ref 6.4–8.3)

## 2014-09-01 MED ORDER — FERROUS FUMARATE 325 (106 FE) MG PO TABS: ORAL_TABLET | ORAL | Status: DC

## 2014-09-02 LAB — IRON AND TIBC CHCC
%SAT: 6 % — AB (ref 21–57)
Iron: 27 ug/dL — ABNORMAL LOW (ref 41–142)
TIBC: 445 ug/dL — ABNORMAL HIGH (ref 236–444)
UIBC: 418 ug/dL — ABNORMAL HIGH (ref 120–384)

## 2014-09-04 ENCOUNTER — Telehealth: Payer: Self-pay | Admitting: Oncology

## 2014-09-04 ENCOUNTER — Other Ambulatory Visit: Payer: Self-pay | Admitting: Oncology

## 2014-09-04 DIAGNOSIS — M25562 Pain in left knee: Secondary | ICD-10-CM

## 2014-09-04 NOTE — Telephone Encounter (Signed)
pt cld & left vm in re to appts for doppler-adv CS will call to sch appt directly w/her-adv if sny other questions to call us @ [phone]

## 2014-09-04 NOTE — Telephone Encounter (Signed)
per pof to sch pt appt-sch appt-sent [facility] email to put order in for Doppler-will call pt after reply

## 2014-09-05 ENCOUNTER — Encounter: Payer: Self-pay | Admitting: Oncology

## 2014-09-05 NOTE — Progress Notes (Signed)
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 I have known her previously, and she was followed at this office most recently by Dr Welton Flakes. She has not had mastectomies or hysterectomy/ oophorectomy. Most recent breast imaging was bilateral mammograms at [facility] 11-03-13 and MRI breast 01-18-14. She is followed by [facility], frequently seeing PA Maryelizabeth Rowan, and has had pelvic ultrasounds in 01-2014 and 03-2014 in follow up of apparent fibroids. Next gyn appointment is in late Dec.   Patient is not aware of any changes in breasts bilaterally. She has very heavy menses, has not been on oral iron. She denies hot flashes. She denies abdominal or pelvic discomfort. She does not get any regular exercise, and is seated all day with her work at [employer]. She notices that she is more fatigued with exertion, but is not SOB at rest. 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 The tumor was triple negative (ER/PR negative and HER 2 1+ ,CISH not done in 2002), treated with left partial mastectomy with axillary node evaluation by Dr.Streck, adjuvant chemotherapy with adriamycin/cytoxan x 4 followed by taxotere weekly x 12 by Dr. Lyndal Pulley, radiation to breast/axilla/supraclavicular region by Dr.Kinard, and since then on observation without known active disease. 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 Last bilateral mammograms were at [facility] 10-28-12 with breast tissue still extremely dense but no mammographic findings of concern; she has submuscular saline implants bilaterally. Last breast MRI was 11-14-11. With the BRCA 2 abnormality and extremely dense breast tissue, yearly MRI is medically necessary.  Review of systems as above, also: No recent infectious illness. Appetite good. No change bowels, bladder ok. No other bleeding. Intermittent numbness from left knee to mid calf at night for > 1 year, no swelling, heat or palpable tenderness.  Migraines stable. No other neurologic symptoms. Remainder of 10 point Review of Systems negative.  Objective:  Vital signs in last 24 hours:  BP 139/87 mmHg  Pulse 87  Temp(Src) 98.2 F (36.8 C) (Oral)  Resp 18  Ht 5' (1.524 m)  Wt 146 lb 1.6 oz (66.271 kg)  BMI 28.53 kg/m2 weight is up 6 lbs.  Alert, oriented and appropriate. Looks comfortable, respirations not labored RA in exam room. Ambulatory without difficulty.    HEENT:PERRL, sclerae not icteric. Oral mucosa moist without lesions, posterior pharynx clear. Mucous membranes somewhat pale. Normal hair pattern. Neck supple. No JVD. Some tearing OD. Lymphatics:no cervical,supraclavicular, axillary or inguinal adenopathy Resp: clear to auscultation bilaterally and normal percussion bilaterally Cardio: regular rate and rhythm. No gallop. GI: soft, nontender, not distended, no mass or organomegaly. Normally active bowel sounds. Musculoskeletal/ Extremities: without pitting edema, cords, tenderness Neuro: no peripheral neuropathy. Otherwise nonfocal Skin without rash,  ecchymosis, petechiae Breasts: (bilateral retropectoral implants). Left with well healed partial mastectomy scar, otherwise bilaterally  without dominant mass, skin or nipple findings. Axillae benign.  Lab Results:  Results for orders placed or performed in visit on 09/01/14  CBC with Differential  Result Value Ref Range   WBC 6.0 3.9 - 10.3 10e3/uL   NEUT# 4.0 1.5 - 6.5 10e3/uL   HGB 9.3 (L) 11.6 - 15.9 g/dL   HCT 30.5 (L) 34.8 - 46.6 %   Platelets 305 145 - 400 10e3/uL   MCV 73.5 (L) 79.5 - 101.0 fL   MCH 22.4 (L) 25.1 - 34.0 pg   MCHC 30.5 (L) 31.5 - 36.0 g/dL   RBC 4.15 3.70 - 5.45 10e6/uL   RDW 17.6 (H) 11.2 - 14.5 %   lymph# 1.5 0.9 - 3.3 10e3/uL   MONO# 0.4 0.1 - 0.9 10e3/uL   Eosinophils Absolute 0.1 0.0 - 0.5 10e3/uL   Basophils Absolute 0.0 0.0 - 0.1 10e3/uL   NEUT% 66.6 38.4 - 76.8 %   LYMPH% 25.5 14.0 - 49.7 %   MONO% 6.2 0.0 - 14.0 %   EOS% 1.5 0.0 - 7.0 %   BASO% 0.2 0.0 - 2.0 %  Comprehensive metabolic panel (Cmet) - [facility]  Result Value Ref Range   Sodium 141 136 - 145 mEq/L   Potassium 3.6 3.5 - 5.1 mEq/L   Chloride 108 98 - 109 mEq/L   CO2 24 22 - 29 mEq/L   Glucose 109 70 - 140 mg/dl   BUN 11.0 7.0 - 26.0 mg/dL   Creatinine 0.9 0.6 - 1.1 mg/dL   Total Bilirubin 0.20 0.20 - 1.20 mg/dL   Alkaline Phosphatase 63 40 - 150 U/L   AST 17 5 - 34 U/L   ALT 12 0 - 55 U/L   Total Protein 7.1 6.4 - 8.3 g/dL   Albumin 3.5 3.5 - 5.0 g/dL   Calcium 9.5 8.4 - 10.4 mg/dL   Anion Gap 9 3 - 11 mEq/L  Iron and TIBC  Result Value Ref Range   Iron 27 (L) 41 - 142 ug/dL   TIBC 445 (H) 236 - 444 ug/dL   UIBC 418 (H) 120 - 384 ug/dL   %SAT 6 (L) 21 - 57 %   iron studies available after visit.  Per gyn note in this EMR, CA125 normal in 03-2014  Studies/Results:  No results found. mammograms 11-03-13 and breast MRI 01-18-14 reviewed  Medications: I have reviewed the patient's current medications.She will begin oral iron as either Hemocyte (if covered by insurance) or ferrous fumarate/ gluconate, ` 325 mg daily on empty stomach with OJ  DISCUSSION: discussed likely iron deficiency anemia at visit,  confirmed by iron studies available subsequently. Appears likely related to heavy gyn bleeding. Oral iron as above, consider IV if unable to tolerate or if not effective, tho it may be difficult to replete iron with ongoing blood loss. I have encouraged her to consider hysterectomy/ BSO, as she tells me physician with [facility] has advised. Will continue mammograms yearly and MRI ~ 3 mo after mammograms, necessary due to BRCA 2 abnormality and dense breast tissue with personal hx premenopausal triple negative breast cancer.  Assessment/Plan:  1.Personal history of T2 N1 (9 node +) triple negative left breast carcinoma 2002: clinically doing well. She has BRCA 2 truncation of uncertain significance. Continue yearly breast MRI in addition to mammograms. Note she has not been interested in prophylactic mastectomies or oophorectomy to this point.  2.iron deficiency anemia with history of heavy menstrual bleeding ongoing: oral iron as above. I will recheck in 2 months. Other blood counts fine. She has apparent fibroids, is watched closely by gynecology. I have encouraged her to consider hysterectomy with BSO if gyn also feels appropriate.  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 She has not seen orthopedics   All questions answered and she knows that she can call at any time if needed prior to scheduled appointment. Time spent 25 min including >50% counseling and coordination of care   Gordy Levan, MD

## 2014-09-06 ENCOUNTER — Encounter: Payer: Self-pay | Admitting: Oncology

## 2014-09-08 ENCOUNTER — Ambulatory Visit (HOSPITAL_COMMUNITY): Payer: 59

## 2014-09-09 ENCOUNTER — Ambulatory Visit (HOSPITAL_COMMUNITY)
Admission: RE | Admit: 2014-09-09 | Discharge: 2014-09-09 | Disposition: A | Discharge status: Home / Self Care | Payer: 59 | Source: Ambulatory Visit | Attending: Oncology | Admitting: Oncology

## 2014-09-09 ENCOUNTER — Encounter: Payer: 59 | Admitting: Nutrition

## 2014-09-09 DIAGNOSIS — M25562 Pain in left knee: Secondary | ICD-10-CM | POA: Diagnosis not present

## 2014-09-09 DIAGNOSIS — M79609 Pain in unspecified limb: Secondary | ICD-10-CM

## 2014-09-09 DIAGNOSIS — R200 Anesthesia of skin: Secondary | ICD-10-CM

## 2014-09-09 NOTE — Progress Notes (Signed)
VASCULAR LAB PRELIMINARY  PRELIMINARY  PRELIMINARY  PRELIMINARY  Left lower extremity venous Doppler completed.    Preliminary report:  There is no DVT, SVT, or Baker's Cyst noted in the left lower extremity.   Kecia Swoboda, RVT 09/09/2014, 4:24 PM

## 2014-09-10 ENCOUNTER — Telehealth: Payer: Self-pay

## 2014-09-10 NOTE — Telephone Encounter (Signed)
Discussed the results of the Iron studies with Tara Bell as noted below by Dr. Marko Plume. Tara Bell stated that she began the [facility] the next day after her appointment with Dr. Marko Plume. Reviewed constipation and stools turning black from the iron.  Patient verbalized understanding.

## 2014-09-10 NOTE — Telephone Encounter (Signed)
-----   Message from Gordy Levan, MD sent at 09/07/2014  3:27 PM EST ----- Labs seen and need follow up: please let her know iron is very low. Be sure she has started the iron (hemocyte or ferrous fumarate/ gluconate), remind her to take on empty stomach with OJ. She should be sure to tell [facility] that iron is low from menstrual bleeding. Call if problems taking the iron or if more symptoms from anemia before I am to see her again in early Jan.

## 2014-09-16 ENCOUNTER — Ambulatory Visit: Payer: 59 | Admitting: Nutrition

## 2014-09-16 NOTE — Progress Notes (Signed)
Patient is a 48 year old female diagnosed with breast cancer in 2002 requesting information on diabetic diet.  Height: 5 feet 0 inches. Weight: 146.1 pounds. BMI: 28.53.  Patient states her doctor has told her she is a borderline diabetic.  States hemoglobin A1c was 6.  Patient requesting information on diabetic diet.  Nutrition diagnosis: Food and nutrition related knowledge deficit related to "diagnosis of" diabetes as evidenced by no prior need for nutrition related information.  Intervention: Patient educated on carbohydrate-controlled diet.  Recommended 3 meals daily with approximately 45-60 g of carbohydrate per meal. Encouraged high fiber foods. Discouraged concentrated sweets. Provided fact sheets.  Contact information given questions were answered and teach back method used.  Monitoring, evaluation, goals: Patient will tolerate dietary changes to improve glycemic control.  Next visit.  Patient to be referred to nutrition and diabetes management Center for further diabetic education as needed.  Nutrition diagnosis now resolved.  **Disclaimer: This note was dictated with voice recognition software. Similar sounding words can inadvertently be transcribed and this note may contain transcription errors which may not have been corrected upon publication of note.**

## 2014-10-14 ENCOUNTER — Encounter (HOSPITAL_COMMUNITY): Payer: Self-pay | Admitting: Cardiology

## 2014-10-19 ENCOUNTER — Ambulatory Visit: Payer: 59 | Admitting: Women's Health

## 2014-10-31 ENCOUNTER — Other Ambulatory Visit: Payer: Self-pay | Admitting: Oncology

## 2014-10-31 DIAGNOSIS — Z9221 Personal history of antineoplastic chemotherapy: Secondary | ICD-10-CM

## 2014-10-31 DIAGNOSIS — C50912 Malignant neoplasm of unspecified site of left female breast: Secondary | ICD-10-CM

## 2014-11-02 ENCOUNTER — Ambulatory Visit (INDEPENDENT_AMBULATORY_CARE_PROVIDER_SITE_OTHER): Payer: 59 | Admitting: Women's Health

## 2014-11-02 ENCOUNTER — Encounter: Payer: Self-pay | Admitting: Women's Health

## 2014-11-02 VITALS — BP 138/80 | Ht 60.0 in | Wt 144.0 lb

## 2014-11-02 DIAGNOSIS — N760 Acute vaginitis: Secondary | ICD-10-CM

## 2014-11-02 DIAGNOSIS — B9689 Other specified bacterial agents as the cause of diseases classified elsewhere: Secondary | ICD-10-CM

## 2014-11-02 DIAGNOSIS — Z01419 Encounter for gynecological examination (general) (routine) without abnormal findings: Secondary | ICD-10-CM

## 2014-11-02 DIAGNOSIS — A499 Bacterial infection, unspecified: Secondary | ICD-10-CM

## 2014-11-02 LAB — WET PREP FOR TRICH, YEAST, CLUE
Trich, Wet Prep: NONE SEEN
WBC WET PREP: NONE SEEN
Yeast Wet Prep HPF POC: NONE SEEN

## 2014-11-02 MED ORDER — METRONIDAZOLE 0.75 % VA GEL: VAGINAL | Status: DC

## 2014-11-02 MED ORDER — FLUCONAZOLE 150 MG PO TABS: 150.0000 mg | ORAL_TABLET | Freq: Once | ORAL | Status: DC

## 2014-11-02 NOTE — Patient Instructions (Signed)
Health Maintenance Adopting a healthy lifestyle and getting preventive care can go a long way to promote health and wellness. Talk with your health care provider about what schedule of regular examinations is right for you. This is a good chance for you to check in with your provider about disease prevention and staying healthy. In between checkups, there are plenty of things you can do on your own. Experts have done a lot of research about which lifestyle changes and preventive measures are most likely to keep you healthy. Ask your health care provider for more information. WEIGHT AND DIET  Eat a healthy diet  Be sure to include plenty of vegetables, fruits, low-fat dairy products, and lean protein.  Do not eat a lot of foods high in solid fats, added sugars, or salt.  Get regular exercise. This is one of the most important things you can do for your health.  Most adults should exercise for at least 150 minutes each week. The exercise should increase your heart rate and make you sweat (moderate-intensity exercise).  Most adults should also do strengthening exercises at least twice a week. This is in addition to the moderate-intensity exercise.  Maintain a healthy weight  Body mass index (BMI) is a measurement that can be used to identify possible weight problems. It estimates body fat based on height and weight. Your health care provider can help determine your BMI and help you achieve or maintain a healthy weight.  For females 25 years of age and older:   A BMI below 18.5 is considered underweight.  A BMI of 18.5 to 24.9 is normal.  A BMI of 25 to 29.9 is considered overweight.  A BMI of 30 and above is considered obese.  Watch levels of cholesterol and blood lipids  You should start having your blood tested for lipids and cholesterol at 47 years of age, then have this test every 5 years.  You may need to have your cholesterol levels checked more often if:  Your lipid or  cholesterol levels are high.  You are older than 47 years of age.  You are at high risk for heart disease.  CANCER SCREENING   Lung Cancer  Lung cancer screening is recommended for adults 97-92 years old who are at high risk for lung cancer because of a history of smoking.  A yearly low-dose CT scan of the lungs is recommended for people who:  Currently smoke.  Have quit within the past 15 years.  Have at least a 30-pack-year history of smoking. A pack year is smoking an average of one pack of cigarettes a day for 1 year.  Yearly screening should continue until it has been 15 years since you quit.  Yearly screening should stop if you develop a health problem that would prevent you from having lung cancer treatment.  Breast Cancer  Practice breast self-awareness. This means understanding how your breasts normally appear and feel.  It also means doing regular breast self-exams. Let your health care provider know about any changes, no matter how small.  If you are in your 20s or 30s, you should have a clinical breast exam (CBE) by a health care provider every 1-3 years as part of a regular health exam.  If you are 76 or older, have a CBE every year. Also consider having a breast X-ray (mammogram) every year.  If you have a family history of breast cancer, talk to your health care provider about genetic screening.  If you are  at high risk for breast cancer, talk to your health care provider about having an MRI and a mammogram every year.  Breast cancer gene (BRCA) assessment is recommended for women who have family members with BRCA-related cancers. BRCA-related cancers include:  Breast.  Ovarian.  Tubal.  Peritoneal cancers.  Results of the assessment will determine the need for genetic counseling and BRCA1 and BRCA2 testing. Cervical Cancer Routine pelvic examinations to screen for cervical cancer are no longer recommended for nonpregnant women who are considered low  risk for cancer of the pelvic organs (ovaries, uterus, and vagina) and who do not have symptoms. A pelvic examination may be necessary if you have symptoms including those associated with pelvic infections. Ask your health care provider if a screening pelvic exam is right for you.   The Pap test is the screening test for cervical cancer for women who are considered at risk.  If you had a hysterectomy for a problem that was not cancer or a condition that could lead to cancer, then you no longer need Pap tests.  If you are older than 65 years, and you have had normal Pap tests for the past 10 years, you no longer need to have Pap tests.  If you have had past treatment for cervical cancer or a condition that could lead to cancer, you need Pap tests and screening for cancer for at least 20 years after your treatment.  If you no longer get a Pap test, assess your risk factors if they change (such as having a new sexual partner). This can affect whether you should start being screened again.  Some women have medical problems that increase their chance of getting cervical cancer. If this is the case for you, your health care provider may recommend more frequent screening and Pap tests.  The human papillomavirus (HPV) test is another test that may be used for cervical cancer screening. The HPV test looks for the virus that can cause cell changes in the cervix. The cells collected during the Pap test can be tested for HPV.  The HPV test can be used to screen women 30 years of age and older. Getting tested for HPV can extend the interval between normal Pap tests from three to five years.  An HPV test also should be used to screen women of any age who have unclear Pap test results.  After 47 years of age, women should have HPV testing as often as Pap tests.  Colorectal Cancer  This type of cancer can be detected and often prevented.  Routine colorectal cancer screening usually begins at 47 years of  age and continues through 47 years of age.  Your health care provider may recommend screening at an earlier age if you have risk factors for colon cancer.  Your health care provider may also recommend using home test kits to check for hidden blood in the stool.  A small camera at the end of a tube can be used to examine your colon directly (sigmoidoscopy or colonoscopy). This is done to check for the earliest forms of colorectal cancer.  Routine screening usually begins at age 50.  Direct examination of the colon should be repeated every 5-10 years through 47 years of age. However, you may need to be screened more often if early forms of precancerous polyps or small growths are found. Skin Cancer  Check your skin from head to toe regularly.  Tell your health care provider about any new moles or changes in   moles, especially if there is a change in a mole's shape or color.  Also tell your health care provider if you have a mole that is larger than the size of a pencil eraser.  Always use sunscreen. Apply sunscreen liberally and repeatedly throughout the day.  Protect yourself by wearing long sleeves, pants, a wide-brimmed hat, and sunglasses whenever you are outside. HEART DISEASE, DIABETES, AND HIGH BLOOD PRESSURE   Have your blood pressure checked at least every 1-2 years. High blood pressure causes heart disease and increases the risk of stroke.  If you are between 75 years and 42 years old, ask your health care provider if you should take aspirin to prevent strokes.  Have regular diabetes screenings. This involves taking a blood sample to check your fasting blood sugar level.  If you are at a normal weight and have a low risk for diabetes, have this test once every three years after 47 years of age.  If you are overweight and have a high risk for diabetes, consider being tested at a younger age or more often. PREVENTING INFECTION  Hepatitis B  If you have a higher risk for  hepatitis B, you should be screened for this virus. You are considered at high risk for hepatitis B if:  You were born in a country where hepatitis B is common. Ask your health care provider which countries are considered high risk.  Your parents were born in a high-risk country, and you have not been immunized against hepatitis B (hepatitis B vaccine).  You have HIV or AIDS.  You use needles to inject street drugs.  You live with someone who has hepatitis B.  You have had sex with someone who has hepatitis B.  You get hemodialysis treatment.  You take certain medicines for conditions, including cancer, organ transplantation, and autoimmune conditions. Hepatitis C  Blood testing is recommended for:  Everyone born from [birth year] through 1964.  Anyone with known risk factors for hepatitis C. Sexually transmitted infections (STIs)  You should be screened for sexually transmitted infections (STIs) including gonorrhea and chlamydia if:  You are sexually active and are younger than 48 years of age.  You are older than 47 years of age and your health care provider tells you that you are at risk for this type of infection.  Your sexual activity has changed since you were last screened and you are at an increased risk for chlamydia or gonorrhea. Ask your health care provider if you are at risk.  If you do not have HIV, but are at risk, it may be recommended that you take a prescription medicine daily to prevent HIV infection. This is called pre-exposure prophylaxis (PrEP). You are considered at risk if:  You are sexually active and do not regularly use condoms or know the HIV status of your partner(s).  You take drugs by injection.  You are sexually active with a partner who has HIV. Talk with your health care provider about whether you are at high risk of being infected with HIV. If you choose to begin PrEP, you should first be tested for HIV. You should then be tested every 3 months for  as long as you are taking PrEP.  PREGNANCY   If you are premenopausal and you may become pregnant, ask your health care provider about preconception counseling.  If you may become pregnant, take 400 to 800 micrograms (mcg) of folic acid every day.  If you want to prevent pregnancy, talk to your  health care provider about birth control (contraception). OSTEOPOROSIS AND MENOPAUSE   Osteoporosis is a disease in which the bones lose minerals and strength with aging. This can result in serious bone fractures. Your risk for osteoporosis can be identified using a bone density scan.  If you are 9 years of age or older, or if you are at risk for osteoporosis and fractures, ask your health care provider if you should be screened.  Ask your health care provider whether you should take a calcium or vitamin D supplement to lower your risk for osteoporosis.  Menopause may have certain physical symptoms and risks.  Hormone replacement therapy may reduce some of these symptoms and risks. Talk to your health care provider about whether hormone replacement therapy is right for you.  HOME CARE INSTRUCTIONS   Schedule regular health, dental, and eye exams.  Stay current with your immunizations.   Do not use any tobacco products including cigarettes, chewing tobacco, or electronic cigarettes.  If you are pregnant, do not drink alcohol.  If you are breastfeeding, limit how much and how often you drink alcohol.  Limit alcohol intake to no more than 1 drink per day for nonpregnant women. One drink equals 12 ounces of beer, 5 ounces of wine, or 1 ounces of hard liquor.  Do not use street drugs.  Do not share needles.  Ask your health care provider for help if you need support or information about quitting drugs.  Tell your health care provider if you often feel depressed.  Tell your health care provider if you have ever been abused or do not feel safe at home. Document Released: 05/07/2011  Document Revised: 03/08/2014 Document Reviewed: 09/23/2013 [facility] Patient Information 2015 [place], [place]. This information is not intended to replace advice given to you by your health care provider. Make sure you discuss any questions you have with your health care provider. Bacterial Vaginosis Bacterial vaginosis is an infection of the vagina. It happens when too many of certain germs (bacteria) grow in the vagina. HOME CARE  Take your medicine as told by your doctor.  Finish your medicine even if you start to feel better.  Do not have sex until you finish your medicine and are better.  Tell your sex partner that you have an infection. They should see their doctor for treatment.  Practice safe sex. Use condoms. Have only one sex partner. GET HELP IF:  You are not getting better after 3 days of treatment.  You have more grey fluid (discharge) coming from your vagina than before.  You have more pain than before.  You have a fever. MAKE SURE YOU:   Understand these instructions.  Will watch your condition.  Will get help right away if you are not doing well or get worse. Document Released: 07/31/2008 Document Revised: 08/12/2013 Document Reviewed: 06/03/2013 [facility] Patient Information 2015 [place], [place]. This information is not intended to replace advice given to you by your health care provider. Make sure you discuss any questions you have with your health care provider.

## 2014-11-02 NOTE — Progress Notes (Signed)
**Note Tara Bell** [name] [birth year] [id number]    History:    Presents for annual exam.  Monthly cycle most months did skip October and November. BTL. Age  48 left breast cancer, triple negative grade 3, lumpectomy, chemotherapy and radiation. BRCA1 negative, 2 inconclusive revised to negative-08/2013. 12/2012 negative abdomen and pelvic CT scan.Consulted with Dr. Toney Rakes 01/2014 for TAH with BSO pt. decided to postpone. Has had problems with anemia, heavy cycle, large fibroid uterus. Taking iron supplement. 03/2014 numerous fibroids largest 10 cm.  Hypertension managed by primary care.  Past medical history, past surgical history, family history and social history were all reviewed and documented in the EPIC chart. Works at [employer],desk job. Engaged to long-term partner. 2 daughters both attending [employer] graduating this year. Numerous family members with hypertension.  ROS:  A ROS was performed and pertinent positives and negatives are included.  Exam:  Filed Vitals:   11/02/14 1605  BP: 138/80    General appearance:  Normal Thyroid:  Symmetrical, normal in size, without palpable masses or nodularity. Respiratory  Auscultation:  Clear without wheezing or rhonchi Cardiovascular  Auscultation:  Regular rate, without rubs, murmurs or gallops  Edema/varicosities:  Not grossly evident Abdominal  Soft,nontender, without masses, guarding or rebound.  Liver/spleen:  No organomegaly noted  Hernia:  None appreciated  Skin  Inspection:  Grossly normal   Breasts: Examined lying and sitting.     Right: Without masses, retractions, discharge or axillary adenopathy.     Left: Without masses, retractions, discharge or axillary adenopathy. Gentitourinary   Inguinal/mons:  Normal without inguinal adenopathy  External genitalia:  Normal  BUS/Urethra/Skene's glands:  Normal  Vagina:  Adherent discharge wet prep positive for amines, clues, TNTC bacteria  Cervix:  Normal  Uterus:  Palpable fibroids,  20 week size uterus, nontender.  Midline and mobile  Adnexa/parametria:     Rt: Without masses or tenderness.   Lt: Without masses or tenderness.  Anus and perineum: Normal  Digital rectal exam: Normal sphincter tone without palpated masses or tenderness  Assessment/Plan:  48 y.o. SBF G2 P2 for annual exam with complaint of vaginal discharge with itching.     Age 29 left breast cancer triple negative grade 3 lumpectomy/chemotherapy/radiation-Dr. Marko Plume manages Fibroid uterus-menorrhagia/anemia on iron supplements Perimenopausal Hypertension-primary care manages Bacterial Vaginosis  Plan: Again discussed TAH with BSO fibroids and history of triple negative breast cancer. Plans to discuss with fianc schedule appointment Dr. Toney Rakes to discuss. Originally had wanted to  wait until closer to menopause. Also will discuss with oncologist Dr. Marko Plume. SBE's, continue annual  mammogram, calcium rich diet, vitamin D 1000 daily and increase regular exercise encouraged. Continue iron supplements. UA. MetroGel vaginal cream 1 applicator at bedtime 5, alcohol precautions reviewed. Diflucan 150 times one dose. Pap normal 2014, new screening guidelines reviewed.   Huel Cote [facility], 5:47 PM 11/02/2014

## 2014-11-03 ENCOUNTER — Ambulatory Visit
Admission: RE | Admit: 2014-11-03 | Discharge: 2014-11-03 | Disposition: A | Discharge status: Home / Self Care | Payer: 59 | Source: Ambulatory Visit

## 2014-11-03 ENCOUNTER — Other Ambulatory Visit: Payer: Self-pay | Admitting: Women's Health

## 2014-11-03 DIAGNOSIS — Z1231 Encounter for screening mammogram for malignant neoplasm of breast: Secondary | ICD-10-CM

## 2014-11-03 MED ORDER — IBUPROFEN 600 MG PO TABS: 600.0000 mg | ORAL_TABLET | Freq: Three times a day (TID) | ORAL | Status: DC | PRN

## 2014-11-04 ENCOUNTER — Telehealth: Payer: Self-pay | Admitting: Oncology

## 2014-11-04 LAB — URINALYSIS W MICROSCOPIC + REFLEX CULTURE
Bilirubin Urine: NEGATIVE
Casts: NONE SEEN
Crystals: NONE SEEN
Glucose, UA: NEGATIVE mg/dL
Hgb urine dipstick: NEGATIVE
Ketones, ur: NEGATIVE mg/dL
LEUKOCYTES UA: NEGATIVE
Nitrite: NEGATIVE
PH: 7.5 (ref 5.0–8.0)
PROTEIN: NEGATIVE mg/dL
Specific Gravity, Urine: 1.017 (ref 1.005–1.030)
UROBILINOGEN UA: 0.2 mg/dL (ref 0.0–1.0)

## 2014-11-04 NOTE — Telephone Encounter (Signed)
, °

## 2014-11-05 LAB — URINE CULTURE

## 2014-11-08 ENCOUNTER — Ambulatory Visit: Payer: 59 | Admitting: Oncology

## 2014-11-08 ENCOUNTER — Other Ambulatory Visit: Payer: 59

## 2014-11-23 ENCOUNTER — Telehealth: Payer: Self-pay | Admitting: Women's Health

## 2014-11-23 DIAGNOSIS — R1030 Lower abdominal pain, unspecified: Secondary | ICD-10-CM

## 2014-11-23 NOTE — Telephone Encounter (Signed)
Telephone call, reports lower abdominal pain, especially with sitting straight, relieved with lying down, had increased frequency. Questions if fibroids have grown. History of multiple fibroids largest 10 cm. Will schedule repeat ultrasound. History of breast cancer and anemia.

## 2014-11-28 ENCOUNTER — Other Ambulatory Visit: Payer: Self-pay | Admitting: Oncology

## 2014-11-28 DIAGNOSIS — D500 Iron deficiency anemia secondary to blood loss (chronic): Secondary | ICD-10-CM

## 2014-11-28 DIAGNOSIS — Z853 Personal history of malignant neoplasm of breast: Secondary | ICD-10-CM

## 2014-11-28 DIAGNOSIS — D259 Leiomyoma of uterus, unspecified: Secondary | ICD-10-CM

## 2014-11-29 ENCOUNTER — Ambulatory Visit (HOSPITAL_BASED_OUTPATIENT_CLINIC_OR_DEPARTMENT_OTHER): Payer: 59 | Admitting: Oncology

## 2014-11-29 ENCOUNTER — Other Ambulatory Visit (HOSPITAL_BASED_OUTPATIENT_CLINIC_OR_DEPARTMENT_OTHER): Payer: 59

## 2014-11-29 ENCOUNTER — Encounter: Payer: Self-pay | Admitting: Oncology

## 2014-11-29 ENCOUNTER — Other Ambulatory Visit: Payer: Self-pay | Admitting: Oncology

## 2014-11-29 ENCOUNTER — Telehealth: Payer: Self-pay | Admitting: Oncology

## 2014-11-29 VITALS — BP 143/90 | HR 66 | Temp 98.2°F | Resp 19 | Ht 60.0 in | Wt 144.2 lb

## 2014-11-29 DIAGNOSIS — Z853 Personal history of malignant neoplasm of breast: Secondary | ICD-10-CM

## 2014-11-29 DIAGNOSIS — N6001 Solitary cyst of right breast: Secondary | ICD-10-CM

## 2014-11-29 DIAGNOSIS — C50912 Malignant neoplasm of unspecified site of left female breast: Secondary | ICD-10-CM

## 2014-11-29 DIAGNOSIS — D500 Iron deficiency anemia secondary to blood loss (chronic): Secondary | ICD-10-CM

## 2014-11-29 LAB — CBC WITH DIFFERENTIAL/PLATELET
BASO%: 0.1 % (ref 0.0–2.0)
Basophils Absolute: 0 10*3/uL (ref 0.0–0.1)
EOS%: 0.7 % (ref 0.0–7.0)
Eosinophils Absolute: 0.1 10*3/uL (ref 0.0–0.5)
HEMATOCRIT: 35.2 % (ref 34.8–46.6)
HEMOGLOBIN: 11.3 g/dL — AB (ref 11.6–15.9)
LYMPH#: 1.8 10*3/uL (ref 0.9–3.3)
LYMPH%: 25.4 % (ref 14.0–49.7)
MCH: 26.7 pg (ref 25.1–34.0)
MCHC: 32.1 g/dL (ref 31.5–36.0)
MCV: 83 fL (ref 79.5–101.0)
MONO#: 0.5 10*3/uL (ref 0.1–0.9)
MONO%: 6.5 % (ref 0.0–14.0)
NEUT#: 4.6 10*3/uL (ref 1.5–6.5)
NEUT%: 67.3 % (ref 38.4–76.8)
PLATELETS: 257 10*3/uL (ref 145–400)
RBC: 4.24 10*6/uL (ref 3.70–5.45)
RDW: 19 % — ABNORMAL HIGH (ref 11.2–14.5)
WBC: 6.9 10*3/uL (ref 3.9–10.3)

## 2014-11-29 LAB — COMPREHENSIVE METABOLIC PANEL (CC13)
ALT: 13 U/L (ref 0–55)
AST: 19 U/L (ref 5–34)
Albumin: 3.8 g/dL (ref 3.5–5.0)
Alkaline Phosphatase: 67 U/L (ref 40–150)
Anion Gap: 12 mEq/L — ABNORMAL HIGH (ref 3–11)
BUN: 10.9 mg/dL (ref 7.0–26.0)
CO2: 25 meq/L (ref 22–29)
CREATININE: 0.8 mg/dL (ref 0.6–1.1)
Calcium: 9 mg/dL (ref 8.4–10.4)
Chloride: 103 mEq/L (ref 98–109)
GLUCOSE: 76 mg/dL (ref 70–140)
Potassium: 3.5 mEq/L (ref 3.5–5.1)
Sodium: 140 mEq/L (ref 136–145)
Total Bilirubin: 0.31 mg/dL (ref 0.20–1.20)
Total Protein: 7.5 g/dL (ref 6.4–8.3)

## 2014-11-29 NOTE — Telephone Encounter (Signed)
, °

## 2014-11-29 NOTE — Progress Notes (Signed)
OFFICE PROGRESS NOTE   11-29-2014  Physicians:V.Bland, J.Fernandez/ Maryelizabeth Rowan PA, G.Shon Hough, [facility]  INTERVAL HISTORY:  Patient is seen, alone for visit, in follow up of iron deficiency anemia related to heavy gyn bleeding, as well as history of stage 3 left breast cancer in 07-2001 (age 49) with BRCA 2 variant of uncertain significance. She had bilateral screening mammograms (not tomo) done at [facility] 11-04-14, with heterogeneously dense breast tissue but otherwise nothing of concern. She is to have repeat gyn [place] with visit to Maryelizabeth Rowan at [facility] on 12-17-14.   Patient has taken ferrous fumarate daily on empty stomach with OJ, as directed, since late Oct 2015, tolerating this with no difficulty. She feels less tired than when I saw her 09-01-14, Hgb then 9.3 with MCV 73.5 and serum iron 27/ %sat 6. Most recent menstrual period was heavy only for one day, tho she changed pads 4x per hour that day. She has had no other bleeding. She has some RLQ discomfort, thus the repeat gyn [place] planned, previous US with fibroids. Note CA 125 on 03-26-14 was 31.6. She tells me that she has decided to have hysterectomy by Dr Lily Peer, tho this is not scheduled as yet.  Patient has not noticed any changes in breasts bilaterally. She has had no recent infectious illness, no new or different pain, bowels ok. Appetite ok.   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 Significance of this variant not known as of genetics note 01-2014  ONCOLOGIC HISTORY History is of 2.5 cm grade 3 invasive ductal carcinoma of left breast with 9 of 14 nodes positive, at age 50 and premenopausal in Sept 2002. The tumor was triple negative (ER/PR negative and HER 2 1+ ,CISH not done in 2002), treated with left partial mastectomy with axillary node evaluation by Dr.Streck, adjuvant chemotherapy with adriamycin/cytoxan x 4 followed by taxotere weekly x 12 by Dr. Lyndal Pulley,  radiation to breast/axilla/supraclavicular region by Dr.Kinard, and since then on observation without known active disease. 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 She had BRCA testing done on research protocol at [facility] in 2010, which found truncation of BRCA 2. Last bilateral mammograms were at [facility] 10-28-12 with breast tissue still extremely dense but no mammographic findings of concern; she has submuscular saline implants bilaterally. Last breast MRI was 11-14-11. With the BRCA 2 abnormality and extremely dense breast tissue, yearly MRI is felt medically necessary  Review of systems as above, also: No SOB or other respiratory symptoms. No LE swelling. Bladder ok Remainder of 10 point Review of Systems negative.  Objective:  Vital signs in last 24 hours:  BP 143/90 mmHg  Pulse 66  Temp(Src) 98.2 F (36.8 C) (Oral)  Resp 19  Ht 5' (1.524 m)  Wt 144 lb 3.2 oz (65.409 kg)  BMI 28.16 kg/m2  SpO2 100%  LMP 10/18/2014  Alert, oriented and appropriate. Ambulatory without difficulty. Looks comfortable, very pleasant and very quiet as always.  HEENT:PERRL, sclerae not icteric. Oral mucosa moist without lesions, posterior pharynx clear.  Neck supple. No JVD.  Lymphatics:no cervical,supraclavicular, axillary or inguinal adenopathy Resp: clear to auscultation bilaterally and normal percussion bilaterally Cardio: regular rate and rhythm. No gallop. GI: soft, nontender, not distended, no mass or organomegaly. Normally active bowel sounds. Musculoskeletal/ Extremities: without pitting edema, cords, tenderness Neuro: nonfocal. PSYCH appropriate mood and affect Skin without rash, ecchymosis, petechiae Breasts: Left with well  healed partial mastectomy scar, bilaterally with subpectoral implants. Left without dominant mass, skin or nipple findings. Right with smoothly rounded area ~ 2 cm diameter at  10:00, otherwise no dominant mass and no skin or nipple findings.  Axillae benign.   Lab Results:  Results for orders placed or performed in visit on 11/29/14  CBC with Differential  Result Value Ref Range   WBC 6.9 3.9 - 10.3 10e3/uL   NEUT# 4.6 1.5 - 6.5 10e3/uL   HGB 11.3 (L) 11.6 - 15.9 g/dL   HCT 35.2 34.8 - 46.6 %   Platelets 257 145 - 400 10e3/uL   MCV 83.0 79.5 - 101.0 fL   MCH 26.7 25.1 - 34.0 pg   MCHC 32.1 31.5 - 36.0 g/dL   RBC 4.24 3.70 - 5.45 10e6/uL   RDW 19.0 (H) 11.2 - 14.5 %   lymph# 1.8 0.9 - 3.3 10e3/uL   MONO# 0.5 0.1 - 0.9 10e3/uL   Eosinophils Absolute 0.1 0.0 - 0.5 10e3/uL   Basophils Absolute 0.0 0.0 - 0.1 10e3/uL   NEUT% 67.3 38.4 - 76.8 %   LYMPH% 25.4 14.0 - 49.7 %   MONO% 6.5 0.0 - 14.0 %   EOS% 0.7 0.0 - 7.0 %   BASO% 0.1 0.0 - 2.0 %  Comprehensive metabolic panel (Cmet) - [facility]  Result Value Ref Range   Sodium 140 136 - 145 mEq/L   Potassium 3.5 3.5 - 5.1 mEq/L   Chloride 103 98 - 109 mEq/L   CO2 25 22 - 29 mEq/L   Glucose 76 70 - 140 mg/dl   BUN 10.9 7.0 - 26.0 mg/dL   Creatinine 0.8 0.6 - 1.1 mg/dL   Total Bilirubin 0.31 0.20 - 1.20 mg/dL   Alkaline Phosphatase 67 40 - 150 U/L   AST 19 5 - 34 U/L   ALT 13 0 - 55 U/L   Total Protein 7.5 6.4 - 8.3 g/dL   Albumin 3.8 3.5 - 5.0 g/dL   Calcium 9.0 8.4 - 10.4 mg/dL   Anion Gap 12 (H) 3 - 11 mEq/L   EGFR >90 >90 ml/min/1.73 m2    CA 125 available after visit 40 by new lab method and 21 by previous lab method run simultaneously;last prior in this EMR was done at [facility] 03-26-14 and was 31  Available after visit:  Serum iron 69, %sat 18, ferritin 14    Medications: I have reviewed the patient's current medications. She will continue ferrous fumarate one daily on empty stomach with OJ; she understands that she will probably need to take the iron for a number of months after hysterectomy,to replenish iron stores.    STUDIES/ RESULTS DIGITAL SCREENING BILATERAL MAMMOGRAM WITH IMPLANTS AND  CAD  [facility]  The patient has retropectoral implants. Standard and implant displaced views were performed.  COMPARISON: Previous exam(s)  ACR Breast Density Category c: The breast tissue is heterogeneously dense, which may obscure small masses.  FINDINGS: There are no findings suspicious for malignancy. Images were processed with CAD.  IMPRESSION: No mammographic evidence of malignancy. A result letter of this screening mammogram will be mailed directly to the patient.  RECOMMENDATION: Screening mammogram in one year. (Code:[id number]-B-01Y)  BI-RADS CATEGORY 1: Negative.    LLE venous doppler 09-09-14 negative for DVT or Baker's cyst   DISCUSSION: I have sent message to [place] lab manager, as new lab method for CA 125 has been higher in all patients, however this difference between new method (40) and  previous method (21) is more than I have generally seen. I will let Elon Alas know this information for upcoming appointment.  CBC information with improvement in Hgb and MCV discussed with patient today.  Right breast finding clinically consistent with cyst, however with her history I prefer [place] evaluation, this requested at [facility]. As she has just had mammograms, she may not need repeat mammograms (tho these were screening and not tomo). Will decide about MRI after this information, would generally prefer MRI 3 months out from mammograms unless questions.   Assessment/ Plan 1.Personal history of T2 N1 (9 node +) triple negative left breast carcinoma 2002: clinically doing well. She has BRCA 2 truncation of uncertain significance. Continue yearly breast MRI in addition to mammograms. Note she has not been interested in prophylactic mastectomies  2.iron deficiency anemia with history of heavy menstrual bleeding ongoing: continue oral iron,improved. 3.uterine fibroids and menorrhagia: I have again strongly encouraged her to proceed with hysterectomy.  WIth her breast cancer history and BRCA 2 finding, BSO would be appropriate with the hysterectomy. Note variation in CA 125 by different lab methods, [place] upcoming and may need to repeat the CA 125. I will be in touch with [facility]. 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 Discussed tamiflu if she develops flu or is exposed. 6.unremarkable LLE venous doppler in Nov 2015.  Time spent 25 min including >50% counseling and coordination of care. Gordy Levan, MD   11/29/2014, 3:42 PM

## 2014-11-30 LAB — IRON AND TIBC CHCC
%SAT: 18 % — ABNORMAL LOW (ref 21–57)
Iron: 69 ug/dL (ref 41–142)
TIBC: 371 ug/dL (ref 236–444)
UIBC: 303 ug/dL (ref 120–384)

## 2014-11-30 LAB — CA 125(PREVIOUS METHOD): CA 125: 21 U/mL (ref 0.0–30.2)

## 2014-11-30 LAB — CA 125: CA 125: 40 U/mL — ABNORMAL HIGH (ref <35)

## 2014-11-30 LAB — FERRITIN CHCC: FERRITIN: 14 ng/mL (ref 9–269)

## 2014-12-01 ENCOUNTER — Other Ambulatory Visit: Payer: Self-pay

## 2014-12-01 ENCOUNTER — Telehealth: Payer: Self-pay | Admitting: Oncology

## 2014-12-01 ENCOUNTER — Other Ambulatory Visit: Payer: Self-pay | Admitting: Oncology

## 2014-12-01 DIAGNOSIS — N6001 Solitary cyst of right breast: Secondary | ICD-10-CM

## 2014-12-01 NOTE — Telephone Encounter (Signed)
Medical Oncology  Phone call to Elon Alas, [place] with [facility], re lab information and recommendation for hysterectomy BSO.  Godfrey Pick, MD

## 2014-12-03 ENCOUNTER — Other Ambulatory Visit: Payer: 59

## 2014-12-03 ENCOUNTER — Ambulatory Visit: Payer: 59 | Admitting: Hematology and Oncology

## 2014-12-07 ENCOUNTER — Encounter: Payer: Self-pay | Admitting: Women's Health

## 2014-12-07 ENCOUNTER — Ambulatory Visit
Admission: RE | Admit: 2014-12-07 | Discharge: 2014-12-07 | Disposition: A | Discharge status: Home / Self Care | Payer: 59 | Source: Ambulatory Visit | Attending: Oncology | Admitting: Oncology

## 2014-12-07 ENCOUNTER — Other Ambulatory Visit: Payer: Self-pay | Admitting: Oncology

## 2014-12-07 DIAGNOSIS — N6001 Solitary cyst of right breast: Secondary | ICD-10-CM

## 2014-12-15 ENCOUNTER — Ambulatory Visit: Payer: Self-pay | Admitting: Women's Health

## 2014-12-15 ENCOUNTER — Other Ambulatory Visit: Payer: Self-pay

## 2014-12-17 ENCOUNTER — Other Ambulatory Visit: Payer: Self-pay

## 2014-12-17 ENCOUNTER — Ambulatory Visit: Payer: Self-pay | Admitting: Women's Health

## 2014-12-24 ENCOUNTER — Telehealth: Payer: Self-pay | Admitting: Oncology

## 2014-12-24 NOTE — Telephone Encounter (Signed)
, °

## 2014-12-31 ENCOUNTER — Other Ambulatory Visit: Payer: Self-pay

## 2014-12-31 ENCOUNTER — Ambulatory Visit: Payer: Self-pay | Admitting: Women's Health

## 2015-01-13 ENCOUNTER — Ambulatory Visit: Payer: 59 | Admitting: Dietician

## 2015-02-03 ENCOUNTER — Encounter: Payer: 59 | Attending: Family Medicine | Admitting: Dietician

## 2015-02-03 ENCOUNTER — Encounter: Payer: Self-pay | Admitting: Dietician

## 2015-02-03 VITALS — Ht 60.0 in | Wt 141.7 lb

## 2015-02-03 DIAGNOSIS — Z713 Dietary counseling and surveillance: Secondary | ICD-10-CM | POA: Insufficient documentation

## 2015-02-03 DIAGNOSIS — Z6827 Body mass index (BMI) 27.0-27.9, adult: Secondary | ICD-10-CM | POA: Diagnosis not present

## 2015-02-03 DIAGNOSIS — E663 Overweight: Secondary | ICD-10-CM | POA: Diagnosis present

## 2015-02-03 NOTE — Patient Instructions (Addendum)
Aim to eat 3 meals and 2 snacks per day (if you are hungry). For breakfast have boiled egg with toast, peanut butter on bread thins, and think about Premier Protein Shakes (have with a carb) for quick breakfast. Aim to fill half of your plate with vegetables, have protein the size of the palm of your hand, and starch/carb for a quarter of your plate. Add a carb to lunch - baked beans or peas or fruit. Use a small plate at dinner meal. Have protein with carbs for snacks - fruit/crackers with cheese/nuts/boiled egg/peanut butter.  Try yogurt and [facility] [employer]. Aim to get 30 minutes of exercise (walking and elliptical) 5 x week.  Try adding flavor packets to water (zero calories/sugar).

## 2015-02-03 NOTE — Progress Notes (Signed)
  Medical Nutrition Therapy:  Appt start time: 1640 end time:  1730.   Assessment:  Primary concerns today: Tara Bell is here today since her Hgb A1c is elevated (6.0% in 11/2014). Has cut back on sweets, pasta, regular soda, and potatoes. Walking and using the elliptical. Baking and broiling food more than frying now. Has lost about 5 lbs in the past 6 months.   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 Lives by herself since her daughter is in college. Skips breakfast 4 x week and eats out about 2 x week.    Would like help choosing sides to have with meat.   Preferred Learning Style:   No preference indicated   Learning Readiness:   Ready  MEDICATIONS: see list   DIETARY INTAKE:  Usual eating pattern includes 2-3 meals and 0-1 snacks per day.  Avoided foods include: some types of fish   24-hr recall:  B ( AM): cereal, toast with fruit, or carnation instant breakfast or skips 4 x week with orange juice or water Snk ( AM): fruit or vegetables (carrot/celery/grapes) L ( PM): grilled chicken breast with green beans Snk ( PM):none D ( PM): fish with baked potato and salad Snk ( PM): none lately, might have fruit Beverages: orange juice, water with lemon, diet soda  Usual physical activity: elliptical 2 x week for 5-10 minutes and walks 5 x week for 20 minutes  Estimated energy needs: 1800 calories 200 g carbohydrates 135 g protein 50 g fat  Progress Towards Goal(s):  In progress.   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  As evidenced by Hgb A1c of 6.0%.    Intervention:  Nutrition counseling provided. Plan: Aim to eat 3 meals and 2 snacks per day (if you are hungry). For breakfast have boiled egg with toast, peanut butter on bread thins, and think about Premier Protein Shakes (have with a carb) for quick breakfast. Aim to fill half of your plate with vegetables, have protein the size of the  palm of your hand, and starch/carb for a quarter of your plate. Add a carb to lunch - baked beans or peas or fruit. Use a small plate at dinner meal. Have protein with carbs for snacks - fruit/crackers with cheese/nuts/boiled egg/peanut butter.  Try yogurt and [facility] [employer]. Aim to get 30 minutes of exercise (walking and elliptical) 5 x week.  Try adding flavor packets to water (zero calories/sugar).   Teaching Method Utilized:  Visual Auditory Hands on  Handouts given during visit include:  Living Well With Diabetes  Meal Card  MyPlate Handout  15 g CHO Snacks  Barriers to learning/adherence to lifestyle change: none  Demonstrated degree of understanding via:  Teach Back   Monitoring/Evaluation:  Dietary intake, exercise, and body weight in 2 month(s).

## 2015-04-12 ENCOUNTER — Ambulatory Visit: Payer: 59 | Admitting: Dietician

## 2015-05-10 ENCOUNTER — Other Ambulatory Visit: Payer: Self-pay

## 2015-05-10 DIAGNOSIS — D509 Iron deficiency anemia, unspecified: Secondary | ICD-10-CM

## 2015-05-10 MED ORDER — FERROUS FUMARATE 325 (106 FE) MG PO TABS: ORAL_TABLET | ORAL | Status: DC

## 2015-05-24 ENCOUNTER — Telehealth: Payer: Self-pay | Admitting: Oncology

## 2015-05-24 NOTE — Telephone Encounter (Signed)
Returned Advertising account executive. Patient confirmed new appointment for [place].

## 2015-05-29 ENCOUNTER — Other Ambulatory Visit: Payer: Self-pay | Admitting: Oncology

## 2015-05-29 DIAGNOSIS — C50912 Malignant neoplasm of unspecified site of left female breast: Secondary | ICD-10-CM

## 2015-05-29 DIAGNOSIS — D500 Iron deficiency anemia secondary to blood loss (chronic): Secondary | ICD-10-CM

## 2015-05-30 ENCOUNTER — Telehealth: Payer: Self-pay

## 2015-05-30 ENCOUNTER — Ambulatory Visit: Payer: 59 | Admitting: Oncology

## 2015-05-30 ENCOUNTER — Other Ambulatory Visit: Payer: 59

## 2015-05-30 DIAGNOSIS — D251 Intramural leiomyoma of uterus: Secondary | ICD-10-CM

## 2015-05-30 DIAGNOSIS — C50919 Malignant neoplasm of unspecified site of unspecified female breast: Secondary | ICD-10-CM

## 2015-05-30 NOTE — Addendum Note (Signed)
Addended by: Baruch Merl on: 05/30/2015 08:41 PM   Modules accepted: Orders

## 2015-05-30 NOTE — Telephone Encounter (Signed)
Tara Bell actually had to reschedule appointment a second time due to work schedule.  Appointment with Tara Bell is set for 07-04-15. Tara Bell has not pursued hysterectomy as she is not emotionally ready to take that step.  She will come to [facility] for CA-125  level on 06-01-15

## 2015-05-30 NOTE — Telephone Encounter (Signed)
-----   Message from Gordy Levan, MD sent at 05/29/2015  3:10 PM EDT ----- She got moved from 7-25 to 8-11, but I would like to get her labs done this week if possible. Need to be sure to see results of CA 125  Please ask if she has been back to gyn since I saw her in Jan - I do not find anything in EMR from gyn recently  thanks

## 2015-06-01 ENCOUNTER — Other Ambulatory Visit (HOSPITAL_BASED_OUTPATIENT_CLINIC_OR_DEPARTMENT_OTHER): Payer: 59

## 2015-06-01 DIAGNOSIS — C50912 Malignant neoplasm of unspecified site of left female breast: Secondary | ICD-10-CM | POA: Diagnosis not present

## 2015-06-01 DIAGNOSIS — D500 Iron deficiency anemia secondary to blood loss (chronic): Secondary | ICD-10-CM | POA: Diagnosis not present

## 2015-06-01 LAB — COMPREHENSIVE METABOLIC PANEL (CC13)
ALT: 10 U/L (ref 0–55)
AST: 16 U/L (ref 5–34)
Albumin: 3.5 g/dL (ref 3.5–5.0)
Alkaline Phosphatase: 63 U/L (ref 40–150)
Anion Gap: 9 mEq/L (ref 3–11)
BUN: 10 mg/dL (ref 7.0–26.0)
CO2: 23 mEq/L (ref 22–29)
Calcium: 9 mg/dL (ref 8.4–10.4)
Chloride: 106 mEq/L (ref 98–109)
Creatinine: 0.8 mg/dL (ref 0.6–1.1)
EGFR: >90 mL/min/{1.73_m2} (ref >90)
Glucose: 92 mg/dl (ref 70–140)
Potassium: 3.4 mEq/L — ABNORMAL LOW (ref 3.5–5.1)
Sodium: 139 mEq/L (ref 136–145)
Total Bilirubin: 0.39 mg/dL (ref 0.20–1.20)
Total Protein: 7.1 g/dL (ref 6.4–8.3)

## 2015-06-01 LAB — CBC WITH DIFFERENTIAL/PLATELET
BASO%: 0.8 % (ref 0.0–2.0)
Basophils Absolute: 0.1 10*3/uL (ref 0.0–0.1)
EOS ABS: 0.1 10*3/uL (ref 0.0–0.5)
EOS%: 1.1 % (ref 0.0–7.0)
HCT: 34.7 % — ABNORMAL LOW (ref 34.8–46.6)
HGB: 11.5 g/dL — ABNORMAL LOW (ref 11.6–15.9)
LYMPH#: 1.4 10*3/uL (ref 0.9–3.3)
LYMPH%: 20 % (ref 14.0–49.7)
MCH: 29.3 pg (ref 25.1–34.0)
MCHC: 33.1 g/dL (ref 31.5–36.0)
MCV: 88.7 fL (ref 79.5–101.0)
MONO#: 0.7 10*3/uL (ref 0.1–0.9)
MONO%: 9.4 % (ref 0.0–14.0)
NEUT%: 68.7 % (ref 38.4–76.8)
NEUTROS ABS: 4.9 10*3/uL (ref 1.5–6.5)
Platelets: 257 10*3/uL (ref 145–400)
RBC: 3.91 10*6/uL (ref 3.70–5.45)
RDW: 15.8 % — AB (ref 11.2–14.5)
WBC: 7.2 10*3/uL (ref 3.9–10.3)

## 2015-06-01 LAB — IRON AND TIBC CHCC
%SAT: 21 % (ref 21–57)
IRON: 66 ug/dL (ref 41–142)
TIBC: 311 ug/dL (ref 236–444)
UIBC: 245 ug/dL (ref 120–384)

## 2015-06-02 LAB — CA 125: CA 125: 37 U/mL — ABNORMAL HIGH (ref <35)

## 2015-06-06 ENCOUNTER — Telehealth: Payer: Self-pay

## 2015-06-06 ENCOUNTER — Other Ambulatory Visit: Payer: Self-pay | Admitting: Women's Health

## 2015-06-06 NOTE — Telephone Encounter (Signed)
-----   Message from Gordy Levan, MD sent at 06/02/2015  9:02 AM EDT ----- Labs seen and need follow up: please let her know that CA 125 marker is just slightly high again on these labs - otherwise the anemia is a little better and chemistries are good except K slightly low. I will talk with her about going back to gyn or gyn oncology when I see her, and also that our genetics counselor would like to repeat some testing on her. She needs to increase high K foods in diet for now. Be sure she knows correct date for MD    thanks

## 2015-06-06 NOTE — Telephone Encounter (Signed)
Told Tara Bell the result of the CA-125 marker as noted below by Dr. Marko Plume. Reviewed food rich in potassium such as OJ, bananas, Cantaloupe.  Tara Bell verbalized understanding.

## 2015-06-16 ENCOUNTER — Other Ambulatory Visit: Payer: 59

## 2015-06-16 ENCOUNTER — Ambulatory Visit: Payer: 59 | Admitting: Oncology

## 2015-06-30 ENCOUNTER — Telehealth: Payer: Self-pay | Admitting: Oncology

## 2015-06-30 ENCOUNTER — Ambulatory Visit (HOSPITAL_BASED_OUTPATIENT_CLINIC_OR_DEPARTMENT_OTHER): Payer: 59 | Admitting: Oncology

## 2015-06-30 ENCOUNTER — Encounter: Payer: Self-pay | Admitting: Oncology

## 2015-06-30 ENCOUNTER — Other Ambulatory Visit (HOSPITAL_BASED_OUTPATIENT_CLINIC_OR_DEPARTMENT_OTHER): Payer: 59

## 2015-06-30 VITALS — BP 133/85 | HR 69 | Temp 97.5°F | Resp 18 | Ht 60.0 in | Wt 143.6 lb

## 2015-06-30 DIAGNOSIS — C50919 Malignant neoplasm of unspecified site of unspecified female breast: Secondary | ICD-10-CM

## 2015-06-30 DIAGNOSIS — C50912 Malignant neoplasm of unspecified site of left female breast: Secondary | ICD-10-CM

## 2015-06-30 DIAGNOSIS — R922 Inconclusive mammogram: Secondary | ICD-10-CM | POA: Diagnosis not present

## 2015-06-30 DIAGNOSIS — R9230 Dense breasts, unspecified: Secondary | ICD-10-CM

## 2015-06-30 DIAGNOSIS — R971 Elevated cancer antigen 125 [CA 125]: Secondary | ICD-10-CM

## 2015-06-30 DIAGNOSIS — D500 Iron deficiency anemia secondary to blood loss (chronic): Secondary | ICD-10-CM | POA: Diagnosis not present

## 2015-06-30 DIAGNOSIS — D251 Intramural leiomyoma of uterus: Secondary | ICD-10-CM

## 2015-06-30 DIAGNOSIS — Z1231 Encounter for screening mammogram for malignant neoplasm of breast: Secondary | ICD-10-CM

## 2015-06-30 NOTE — Telephone Encounter (Signed)
Gave adn pritned appt sched and avs for pt for Sept and DEC.Marland KitchenMarland KitchenMarland Kitchen

## 2015-06-30 NOTE — Progress Notes (Signed)
OFFICE PROGRESS NOTE   June 30, 2015   Physicians:V.Bland, J.Fernandez/ Elon Alas PA, G.[place], [facility]  INTERVAL HISTORY:  Patient is seen, alone for visit, in follow up of personal history of premenopausal left breast cancer 2002 (age 49), iron deficiency anemia related to heavy menses, and low elevation in CA 125. 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  Patient had bilateral screening mammograms at [facility] 11-03-14, then diagnostic right with right [place] on 12-07-14, findings consistent with the right fibroadenoma previously biopsied, and no other mammographic findings of concern, tho breast tissue still heterogeneously dense (and bilateral implants).   She continues oral iron as ferrous fumarate, one daily on empty stomach with OJ. Iron stores are improving, as is hemoglobin, since she has been on the oral iron. She will continue same. LMP earlier this month had heavy bleeding x just one day (changing pads hourly then) with some bleeding for total 6 days; period last month had several days of heavy bleeding.   Patient has fibroids, last [place] by gyn 03-26-14 (not repeated early this year, which had been plan when I saw her in 11-2014). She discussed hysterectomy with Dr Toney Rakes, which she is now in agreement with having done. With her cancer history, including BRCA 2 concern, I would be in favor of BSO with that procedure.  Note CA 125 on 11-29-14 was 21 by previous testing method and 40 by the new method that was being done in conjunction with older testing method then; repeat by new testing method on 06-01-15 was 37; marker repeated today resulted after visit at 29. Her menstrual periods continue regular, this month with heavy bleeding x 1 day only (changing pads every hour) with lighter bleeding x total 6 days; last month several days of the heavy bleeding.  Communication from genetics counselor to MD: "Patient had genetics testing in 2010  with BRCA 2 truncation of uncertain significance. [place] genetics counselors have reviewed that information. Since this was performed under a research protocol, I do think it should be repeated. It can be repeated through [place] if that is what she is interested in. I have never used [facility]'s product, although I could get a test kit and use it.   Because this was a truncating mutation, it should be easily picked up. We could provide a copy of this mutation to the company so that they know what to look for in [place].  That said, I would prefer to send to [facility]. [facility] has the most experience with testing BRCA mutations, but they have propritary information on mutations that were previously VUS's, but are now known mutations."    No PAC BRCA 2 truncation by testing 2010. Significance of this variant not known as of genetics note 01-2014  Her job is going well, daughter just left for college. 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 The tumor was triple negative (ER/PR negative and HER 2 1+ ,CISH not done in 2002), treated with left partial mastectomy with axillary node evaluation by Dr.Streck, adjuvant chemotherapy with adriamycin/cytoxan x 4 followed by taxotere weekly x 12 by Dr. Starr Sinclair, radiation to breast/axilla/supraclavicular region by Dr.Kinard, and since then on observation without known active disease. 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 She had BRCA testing done on research protocol at [facility] in 2010, which found truncation of BRCA 2. Last bilateral mammograms were at [facility] 10-28-12 with breast tissue still extremely dense but no mammographic findings of concern; she has submuscular saline implants bilaterally. Last  breast MRI was 11-14-11. With the BRCA 2 abnormality and extremely dense breast tissue, yearly MRI is felt medically necessary    Review of systems as above, also: No noted changes in breasts bilaterally. No abdominal or pelvic discomfort now. No other bleeding. No GI symptoms. No recent infectious illness. Energy good.  Remainder of 10 point Review of Systems negative.  Objective:  Vital signs in last 24 hours:  BP 133/85 mmHg  Pulse 69  Temp(Src) 97.5 F (36.4 C) (Oral)  Resp 18  Ht 5' (1.524 m)  Wt 143 lb 9.6 oz (65.137 kg)  BMI 28.05 kg/m2  SpO2 100%  Alert, oriented and appropriate. Easily mobile  HEENT:PERRL, sclerae not icteric. Oral mucosa moist without lesions, posterior pharynx clear.  Neck supple. No JVD.  Lymphatics:no cervical,supraclavicular, axillary or inguinal adenopathy Resp: clear to auscultation bilaterally and normal percussion bilaterally Cardio: regular rate and rhythm. No gallop. GI: soft, nontender, not distended, no mass or organomegaly. Normally active bowel sounds.  Musculoskeletal/ Extremities: without pitting edema, cords, tenderness Neuro: no peripheral neuropathy. Otherwise nonfocal Skin without rash, ecchymosis, petechiae Breasts: Surgical scars well healed, implants bilaterally. Breasts without dominant mass, skin or nipple findings. Axillae benign.  Lab Results:  Results for orders placed or performed in visit on 06/01/15  CBC with Differential  Result Value Ref Range   WBC 7.2 3.9 - 10.3 10e3/uL   NEUT# 4.9 1.5 - 6.5 10e3/uL   HGB 11.5 (L) 11.6 - 15.9 g/dL   HCT 34.7 (L) 34.8 - 46.6 %   Platelets 257 145 - 400 10e3/uL   MCV 88.7 79.5 - 101.0 fL   MCH 29.3 25.1 - 34.0 pg   MCHC 33.1 31.5 - 36.0 g/dL   RBC 3.91 3.70 - 5.45 10e6/uL   RDW 15.8 (H) 11.2 - 14.5 %   lymph# 1.4 0.9 - 3.3 10e3/uL   MONO# 0.7 0.1 - 0.9 10e3/uL   Eosinophils Absolute 0.1 0.0 - 0.5 10e3/uL   Basophils Absolute 0.1 0.0 - 0.1 10e3/uL   NEUT% 68.7 38.4 - 76.8 %    LYMPH% 20.0 14.0 - 49.7 %   MONO% 9.4 0.0 - 14.0 %   EOS% 1.1 0.0 - 7.0 %   BASO% 0.8 0.0 - 2.0 %  Comprehensive metabolic panel (Cmet) - [facility]  Result Value Ref Range   Sodium 139 136 - 145 mEq/L   Potassium 3.4 (L) 3.5 - 5.1 mEq/L   Chloride 106 98 - 109 mEq/L   CO2 23 22 - 29 mEq/L   Glucose 92 70 - 140 mg/dl   BUN 10.0 7.0 - 26.0 mg/dL   Creatinine 0.8 0.6 - 1.1 mg/dL   Total Bilirubin 0.39 0.20 - 1.20 mg/dL   Alkaline Phosphatase 63 40 - 150 U/L   AST 16 5 - 34 U/L   ALT 10 0 - 55 U/L   Total Protein 7.1 6.4 - 8.3 g/dL   Albumin 3.5 3.5 - 5.0 g/dL   Calcium 9.0 8.4 - 10.4 mg/dL   Anion Gap 9 3 - 11 mEq/L   EGFR >90 >90 ml/min/1.73 m2  CA 125  Result Value Ref Range   CA 125 37 (H) <35 U/mL  Iron and TIBC [facility]  Result  Value Ref Range   Iron 66 41 - 142 ug/dL   TIBC 311 236 - 444 ug/dL   UIBC 245 120 - 384 ug/dL   %SAT 21 21 - 57 %     CA 125 repeated today (06-30-15) now resulted at 28  Studies/Results:  No results found.  Medications: I have reviewed the patient's current medications. Continue oral iron as ferrous fumarate, as iron stores some better and hemoglobin also.  DISCUSSION:  Discussed slight elevation of CA 125, which is not diagnostic of malignancy, but still concerning with her history. She is in agreement with seeing Dr Toney Rakes sooner than presently scheduled appointment in Dec. I have spoken directly with his office now, appointment available 07-05-15. Note she does want hysterectomy; with her cancer history and BRCA concern, I would be in favor of BSO with this.  She also agrees to see genetics counselors again, possible retesting as above.    Assessment/Plan:  1.Personal history of T2 N1 (9 node +) triple negative left breast carcinoma 2002: clinically doing well. She has BRCA 2 truncation of uncertain significance. Breast MRI medically appropriate within 3 months after next  mammograms. Note she has not been interested in prophylactic  mastectomies  Referral back to genetics counselors, as may be helpful to repeat testing now. I will see her back in Jan, shortly after next mammograms. 2.iron deficiency anemia with history of heavy menstrual bleeding ongoing: continue oral iron,improved. 3.uterine fibroids and menorrhagia: I have again strongly encouraged her to proceed with hysterectomy. WIth her breast cancer history and BRCA 2 finding, BSO would be appropriate with the hysterectomy. Note variation in CA 125 by different lab methods and with serial evaluations. Appreciate Dr Toney Rakes seeing her. 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 Heterogeneously dense breast tissue 6.bilateral breast reconstructions, subpectoral.  All questions answered and patient knows to call if concerns prior to next scheduled visit. Time spent 30 min including >50% counseling and coordination of care. Cc Dr Criss Rosales, Dr Ronnell Freshwater, MD   06/30/2015, 9:10 AM

## 2015-07-01 LAB — CA 125: CA 125: 28 U/mL (ref <35)

## 2015-07-03 DIAGNOSIS — R9230 Dense breasts, unspecified: Secondary | ICD-10-CM | POA: Insufficient documentation

## 2015-07-03 DIAGNOSIS — Z1231 Encounter for screening mammogram for malignant neoplasm of breast: Secondary | ICD-10-CM | POA: Insufficient documentation

## 2015-07-03 DIAGNOSIS — R922 Inconclusive mammogram: Secondary | ICD-10-CM | POA: Insufficient documentation

## 2015-07-03 DIAGNOSIS — R971 Elevated cancer antigen 125 [CA 125]: Secondary | ICD-10-CM | POA: Insufficient documentation

## 2015-07-03 DIAGNOSIS — D500 Iron deficiency anemia secondary to blood loss (chronic): Secondary | ICD-10-CM | POA: Insufficient documentation

## 2015-07-04 ENCOUNTER — Ambulatory Visit: Payer: 59 | Admitting: Oncology

## 2015-07-04 ENCOUNTER — Other Ambulatory Visit: Payer: 59

## 2015-07-05 ENCOUNTER — Other Ambulatory Visit: Payer: Self-pay | Admitting: Gynecology

## 2015-07-05 ENCOUNTER — Ambulatory Visit: Payer: Self-pay | Admitting: Gynecology

## 2015-07-05 ENCOUNTER — Other Ambulatory Visit: Payer: Self-pay | Admitting: Women's Health

## 2015-07-05 ENCOUNTER — Telehealth: Payer: Self-pay | Admitting: Women's Health

## 2015-07-05 DIAGNOSIS — Z853 Personal history of malignant neoplasm of breast: Secondary | ICD-10-CM

## 2015-07-05 DIAGNOSIS — N852 Hypertrophy of uterus: Secondary | ICD-10-CM

## 2015-07-05 DIAGNOSIS — D251 Intramural leiomyoma of uterus: Secondary | ICD-10-CM

## 2015-07-05 DIAGNOSIS — N83209 Unspecified ovarian cyst, unspecified side: Secondary | ICD-10-CM

## 2015-07-05 NOTE — Telephone Encounter (Signed)
Phone call to [facility], she has had CA 125 range from 21-40, will check an ova 1 tomorrow follow-up with Dr. Toney Rakes the following week with an ultrasound and discussion of possible [facility] BSO. Breast cancer history

## 2015-07-05 NOTE — Telephone Encounter (Signed)
-----   Message from Gorden Harms sent at 07/05/2015  8:00 AM EDT ----- Regarding: help please Contact: [phone] Izora Gala, we got this referral in from Dr Marko Plume and appt Was made w/dr JF to have ca 125 and discuss possible SX. Pt called back and requested you FIRST!    Thanks :)  JF sent the message below with not knowing the above. Will you please call patient and see if you can address With her the need to see JF first or after talking w/her You tell us to keep the visit with you?  Make sense? ----- Message -----    From: Terrance Mass, MD    Sent: 07/04/2015   8:16 AM      To: Ernestine Mcmurray please make appointment for this patient to see me for a complete GYN exam and ultrasound. History of breast cancer and fibroid uterus.

## 2015-07-06 ENCOUNTER — Ambulatory Visit: Payer: Self-pay | Admitting: Women's Health

## 2015-07-06 ENCOUNTER — Other Ambulatory Visit: Payer: 59

## 2015-07-06 DIAGNOSIS — Z853 Personal history of malignant neoplasm of breast: Secondary | ICD-10-CM

## 2015-07-06 DIAGNOSIS — N83209 Unspecified ovarian cyst, unspecified side: Secondary | ICD-10-CM

## 2015-07-08 ENCOUNTER — Ambulatory Visit: Payer: Self-pay | Admitting: Women's Health

## 2015-07-08 ENCOUNTER — Telehealth: Payer: Self-pay | Admitting: *Deleted

## 2015-07-08 NOTE — Telephone Encounter (Signed)
Pt called requesting Ova 1 results pt aware results not back yet, will call once they return

## 2015-07-12 ENCOUNTER — Telehealth: Payer: Self-pay | Admitting: Oncology

## 2015-07-12 NOTE — Telephone Encounter (Signed)
Returned Advertising account executive. Confirmed appointment from 09/08 Genetics to 09/14

## 2015-07-13 ENCOUNTER — Ambulatory Visit: Payer: 59 | Admitting: Gynecology

## 2015-07-13 ENCOUNTER — Other Ambulatory Visit: Payer: 59

## 2015-07-14 ENCOUNTER — Other Ambulatory Visit: Payer: 59

## 2015-07-14 ENCOUNTER — Encounter: Payer: 59 | Admitting: Genetic Counselor

## 2015-07-17 ENCOUNTER — Encounter: Payer: Self-pay | Admitting: Women's Health

## 2015-07-18 ENCOUNTER — Telehealth: Payer: Self-pay

## 2015-07-18 NOTE — Telephone Encounter (Signed)
Patient called inquiring about her labs from 07/06/15. What to tell her?  I see she had appt 07/12/15 to come and discuss with Dr. Moshe Salisbury but it appears she cancelled that appointment.

## 2015-07-19 ENCOUNTER — Telehealth: Payer: Self-pay | Admitting: Genetic Counselor

## 2015-07-19 ENCOUNTER — Encounter: Payer: Self-pay | Admitting: Genetic Counselor

## 2015-07-19 DIAGNOSIS — Z1379 Encounter for other screening for genetic and chromosomal anomalies: Secondary | ICD-10-CM | POA: Insufficient documentation

## 2015-07-19 NOTE — Telephone Encounter (Signed)
Discussed that she had updated testing in 2014, which found the original BRCA2 VUS with additional VUS in PTEN and TP53.  Both the TP53 and PTEN VUS have been reclassified as a benign polymorphism, but the BRCA2 VUS is still a VUS.  They have seen it 5X.  At this point, there is no further testing that we can offer her based on her family history and previous testing.  I will reach out to Dr. Marko Plume to ensure that I am not missing something, and will cancel her appointment in the meantime.  If we need to r/s I will call her back and r/s the appointment.

## 2015-07-19 NOTE — Telephone Encounter (Signed)
I called Tara Bell - informed results are low she needs to follow-up for a ultrasound and discuss possible TAH with BSO, history of breast cancer and large fibroids. She canceled appointment, instructed her to reschedule. Had spotting several days after this past cycle, is spotting persists or continues next month sonohysterogram with Dr. Toney Rakes. Offered to do sonohysterogram at this time declines.

## 2015-07-20 ENCOUNTER — Other Ambulatory Visit: Payer: 59

## 2015-07-20 ENCOUNTER — Other Ambulatory Visit: Payer: Self-pay | Admitting: Oncology

## 2015-07-20 ENCOUNTER — Encounter: Payer: 59 | Admitting: Genetic Counselor

## 2015-08-04 ENCOUNTER — Other Ambulatory Visit: Payer: Self-pay | Admitting: Women's Health

## 2015-08-09 ENCOUNTER — Other Ambulatory Visit: Payer: Self-pay | Admitting: Cardiology

## 2015-11-02 ENCOUNTER — Other Ambulatory Visit: Payer: Self-pay

## 2015-11-02 ENCOUNTER — Other Ambulatory Visit: Payer: Self-pay | Admitting: Oncology

## 2015-11-02 DIAGNOSIS — R971 Elevated cancer antigen 125 [CA 125]: Secondary | ICD-10-CM

## 2015-11-03 ENCOUNTER — Telehealth: Payer: Self-pay | Admitting: Women's Health

## 2015-11-03 ENCOUNTER — Other Ambulatory Visit (HOSPITAL_BASED_OUTPATIENT_CLINIC_OR_DEPARTMENT_OTHER): Payer: 59

## 2015-11-03 ENCOUNTER — Encounter: Payer: Self-pay | Admitting: Oncology

## 2015-11-03 ENCOUNTER — Ambulatory Visit (HOSPITAL_BASED_OUTPATIENT_CLINIC_OR_DEPARTMENT_OTHER): Payer: 59 | Admitting: Oncology

## 2015-11-03 VITALS — BP 125/83 | HR 71 | Temp 98.4°F | Resp 18 | Ht 60.0 in | Wt 144.1 lb

## 2015-11-03 DIAGNOSIS — D259 Leiomyoma of uterus, unspecified: Secondary | ICD-10-CM | POA: Diagnosis not present

## 2015-11-03 DIAGNOSIS — N920 Excessive and frequent menstruation with regular cycle: Secondary | ICD-10-CM

## 2015-11-03 DIAGNOSIS — R971 Elevated cancer antigen 125 [CA 125]: Secondary | ICD-10-CM

## 2015-11-03 DIAGNOSIS — R922 Inconclusive mammogram: Secondary | ICD-10-CM | POA: Diagnosis not present

## 2015-11-03 DIAGNOSIS — D500 Iron deficiency anemia secondary to blood loss (chronic): Secondary | ICD-10-CM

## 2015-11-03 DIAGNOSIS — D509 Iron deficiency anemia, unspecified: Secondary | ICD-10-CM | POA: Diagnosis not present

## 2015-11-03 DIAGNOSIS — Z853 Personal history of malignant neoplasm of breast: Secondary | ICD-10-CM

## 2015-11-03 DIAGNOSIS — C50912 Malignant neoplasm of unspecified site of left female breast: Secondary | ICD-10-CM

## 2015-11-03 DIAGNOSIS — Z1501 Genetic susceptibility to malignant neoplasm of breast: Secondary | ICD-10-CM

## 2015-11-03 LAB — COMPREHENSIVE METABOLIC PANEL
ALT: 14 U/L (ref 0–55)
AST: 18 U/L (ref 5–34)
Albumin: 3.5 g/dL (ref 3.5–5.0)
Alkaline Phosphatase: 66 U/L (ref 40–150)
Anion Gap: 8 mEq/L (ref 3–11)
BUN: 12.5 mg/dL (ref 7.0–26.0)
CO2: 25 meq/L (ref 22–29)
CREATININE: 0.9 mg/dL (ref 0.6–1.1)
Calcium: 9.1 mg/dL (ref 8.4–10.4)
Chloride: 108 mEq/L (ref 98–109)
EGFR: 90 mL/min/{1.73_m2} — ABNORMAL LOW (ref >90)
GLUCOSE: 98 mg/dL (ref 70–140)
Potassium: 3.9 mEq/L (ref 3.5–5.1)
SODIUM: 141 meq/L (ref 136–145)
TOTAL PROTEIN: 7.2 g/dL (ref 6.4–8.3)
Total Bilirubin: 0.44 mg/dL (ref 0.20–1.20)

## 2015-11-03 LAB — CBC WITH DIFFERENTIAL/PLATELET
BASO%: 0.1 % (ref 0.0–2.0)
Basophils Absolute: 0 10*3/uL (ref 0.0–0.1)
EOS%: 1.1 % (ref 0.0–7.0)
Eosinophils Absolute: 0.1 10*3/uL (ref 0.0–0.5)
HCT: 37.4 % (ref 34.8–46.6)
HGB: 12.5 g/dL (ref 11.6–15.9)
LYMPH%: 23.7 % (ref 14.0–49.7)
MCH: 29.3 pg (ref 25.1–34.0)
MCHC: 33.4 g/dL (ref 31.5–36.0)
MCV: 87.8 fL (ref 79.5–101.0)
MONO#: 0.7 10*3/uL (ref 0.1–0.9)
MONO%: 9.7 % (ref 0.0–14.0)
NEUT%: 65.4 % (ref 38.4–76.8)
NEUTROS ABS: 4.7 10*3/uL (ref 1.5–6.5)
PLATELETS: 235 10*3/uL (ref 145–400)
RBC: 4.26 10*6/uL (ref 3.70–5.45)
RDW: 15.3 % — ABNORMAL HIGH (ref 11.2–14.5)
WBC: 7.1 10*3/uL (ref 3.9–10.3)
lymph#: 1.7 10*3/uL (ref 0.9–3.3)

## 2015-11-03 NOTE — Progress Notes (Signed)
OFFICE PROGRESS NOTE   November 03, 2015   Physicians:V.Bland, J.Fernandez/ Elon Alas PA, G.Towanda Malkin, [facility]  INTERVAL HISTORY:  Patient is seen, alone for visit, in follow up of premenopausal triple negative left breast cancer with 9 involved nodes in 2002 (age 49), this in setting of BRCA 2 VUS. She has been on observation thru this office since completing adjuvant chemo and radiation 2003.  Recent concern has been intermittent slight elevation of CA 125 with some irregular and some heavy vaginal bleeding.  She was iron deficient related to gyn blood loss prior to oral iron.  Patient is scheduled for mammograms at [facility] 11-14-15, last done 11-03-14. She has bilateral retropectoral saline implants and heterogeneously dense breast tissue. With high risk features, she had breast MRI 01-08-14. She has noticed no changes in breasts bilaterally .   CA 125 was 37 on 06-01-15 and 28 on repeat 06-30-15. She did not follow up for [place] and discussion of TAH BSO, but is scheduled back to gyn NP tomorrow and to see Dr Toney Rakes in early Jan. She plans to keep those appointments. She had some spotting between periods a few months ago, however last menses were somewhat less heavy and no spotting. She denies abdominal or pelvic discomfort. Bowels are moving regularly, bladder ok, no other bleeding.  She continues oral iron as ferrous fumarate daily, tolerating without difficulty. She is more energetic than prior to iron. She needs to continue iron 5-7 days weekly as long as she is having menses.    Patient otherwise has been well. 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 She denies new or different pain, SOB, LE swelling, fever or symptoms of infection Remainder of 10 point Review of Systems negative.  No PAC BRCA 2 truncation by testing 2010.  Updated testing in 2014, which found the original BRCA2 VUS with additional  VUS in PTEN and TP53. Both the TP53 and PTEN VUS have been reclassified as a benign polymorphism, but the BRCA2 VUS is still a VUS, seen  5X per genetics counselor update 07-19-15.  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 The tumor was triple negative (ER/PR negative and HER 2 1+ ,CISH not done in 2002), treated with left partial mastectomy with axillary node evaluation by Dr.Streck, adjuvant chemotherapy with adriamycin/cytoxan x 4 followed by taxotere weekly x 12 by Dr. Starr Sinclair, radiation to breast/axilla/supraclavicular region by Dr.Kinard, and since then on observation without known active disease. 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 She had BRCA testing done on research protocol at [facility] in 2010, which found truncation of BRCA 2. Last bilateral mammograms were at [facility] 10-28-12 with breast tissue still extremely dense but no mammographic findings of concern; she has submuscular saline implants bilaterally. Last breast MRI was 11-14-11. With the BRCA 2 abnormality and extremely dense breast tissue, yearly MRI is felt medically necessary   Objective:  Vital signs in last 24 hours:  BP 125/83 mmHg  Pulse 71  Temp(Src) 98.4 F (36.9 C) (Oral)  Resp 18  Ht 5' (1.524 m)  Wt 144 lb 1.6 oz (65.363 kg)  BMI 28.14 kg/m2  SpO2 99% Weight up 4 lbs. Very pleasant as always, looks comfortable. Alert, oriented and appropriate. Ambulatory without difficulty.   HEENT:PERRL, sclerae not  icteric, slight tearing R>L. Oral mucosa moist without lesions, posterior pharynx clear. Mucous membranes not pale Neck supple. No JVD.  Lymphatics:no cervical,supraclavicular, axillary or inguinal adenopathy Resp: clear to auscultation bilaterally and normal percussion bilaterally Cardio: regular  rate and rhythm. No gallop. GI: soft, nontender, not distended, no mass or organomegaly. Normally active bowel sounds.  Musculoskeletal/ Extremities: without pitting edema, cords, tenderness Neuro: no peripheral neuropathy. Otherwise nonfocal. PSYCH appropriate mood and affect Skin without rash, ecchymosis, petechiae. Nailbeds not pale. Breasts: left with well healed partial mastectomy scar, bilaterally without dominant mass, skin or nipple findings. Axillae benign. (Subpectoral implants)   Lab Results:  Results for orders placed or performed in visit on 11/03/15  CBC with Differential  Result Value Ref Range   WBC 7.1 3.9 - 10.3 10e3/uL   NEUT# 4.7 1.5 - 6.5 10e3/uL   HGB 12.5 11.6 - 15.9 g/dL   HCT 37.4 34.8 - 46.6 %   Platelets 235 145 - 400 10e3/uL   MCV 87.8 79.5 - 101.0 fL   MCH 29.3 25.1 - 34.0 pg   MCHC 33.4 31.5 - 36.0 g/dL   RBC 4.26 3.70 - 5.45 10e6/uL   RDW 15.3 (H) 11.2 - 14.5 %   lymph# 1.7 0.9 - 3.3 10e3/uL   MONO# 0.7 0.1 - 0.9 10e3/uL   Eosinophils Absolute 0.1 0.0 - 0.5 10e3/uL   Basophils Absolute 0.0 0.0 - 0.1 10e3/uL   NEUT% 65.4 38.4 - 76.8 %   LYMPH% 23.7 14.0 - 49.7 %   MONO% 9.7 0.0 - 14.0 %   EOS% 1.1 0.0 - 7.0 %   BASO% 0.1 0.0 - 2.0 %  Comprehensive metabolic panel  Result Value Ref Range   Sodium 141 136 - 145 mEq/L   Potassium 3.9 3.5 - 5.1 mEq/L   Chloride 108 98 - 109 mEq/L   CO2 25 22 - 29 mEq/L   Glucose 98 70 - 140 mg/dl   BUN 12.5 7.0 - 26.0 mg/dL   Creatinine 0.9 0.6 - 1.1 mg/dL   Total Bilirubin 0.44 0.20 - 1.20 mg/dL   Alkaline Phosphatase 66 40 - 150 U/L   AST 18 5 - 34 U/L   ALT 14 0 - 55 U/L   Total Protein 7.2 6.4 - 8.3 g/dL   Albumin 3.5 3.5 - 5.0 g/dL   Calcium 9.1 8.4 - 10.4 mg/dL   Anion Gap 8 3 - 11 mEq/L   EGFR 90 (L) >90 ml/min/1.73 m2     Studies/Results:  No results found. Mammograms pending 11-14-15.    Will request MRI breast within 3 months after mammograms, medically necessary due to high risk (triple  negative, premenopausal) breast cancer, BRCA 2 variant and dense breast tissue.  Medications: I have reviewed the patient's current medications.Can decrease oral iron to 5x weekly but needs to continue as long as menses.  DISCUSSION: Anemia corrected on oral iron, plan as above. She will keep gyn follow up as scheduled, and I have encouraged her to ask Dr Toney Rakes the questions that she has concerning recommended hysterectomy. She appreciates Francisca December care.  Assessment/Plan:  1.Personal history of T2 N1 (9 node +) triple negative left breast carcinoma 2002: clinically doing well. She has BRCA 2 truncation of uncertain significance. Breast MRI medically appropriate within 3 months after next mammograms. Note she has not been interested in prophylactic mastectomies I will see her in ~ 8 months 2.iron deficiency anemia with history of heavy menstrual bleeding ongoing: much improved, continue oral iron  tho ok to decrease dose as above. 3.uterine fibroids and menorrhagia: I have again strongly encouraged her consider hysterectomy. WIth her breast cancer history and BRCA 2 finding, BSO would be appropriate with the hysterectomy. Note variation in CA 125 by different lab methods and with serial evaluations. Appreciate Dr Toney Rakes seeing her. 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 Heterogeneously dense breast tissue 6.bilateral breast reconstructions, subpectoral.  All questions answered. Time spent 20 min including >50% counseling and coordination of care. Cc Dr Clint Bolder, MD   11/03/2015, 4:23 PM

## 2015-11-03 NOTE — Telephone Encounter (Signed)
t

## 2015-11-04 ENCOUNTER — Encounter: Payer: Self-pay | Admitting: Women's Health

## 2015-11-04 ENCOUNTER — Encounter: Payer: 59 | Admitting: Women's Health

## 2015-11-04 ENCOUNTER — Telehealth: Payer: Self-pay | Admitting: Oncology

## 2015-11-04 ENCOUNTER — Ambulatory Visit (INDEPENDENT_AMBULATORY_CARE_PROVIDER_SITE_OTHER): Payer: 59 | Admitting: Women's Health

## 2015-11-04 VITALS — BP 128/80 | Ht 60.0 in | Wt 143.0 lb

## 2015-11-04 DIAGNOSIS — Z01419 Encounter for gynecological examination (general) (routine) without abnormal findings: Secondary | ICD-10-CM | POA: Diagnosis not present

## 2015-11-04 NOTE — Telephone Encounter (Signed)
Spoke with patient and she is aware of her 06/2016 appointment and will call for her mri

## 2015-11-04 NOTE — Patient Instructions (Signed)
Uterine Fibroids Uterine fibroids are tissue masses (tumors) that can develop in the womb (uterus). They are also called leiomyomas. This type of tumor is not cancerous (benign) and does not spread to other parts of the body outside of the pelvic area, which is between the hip bones. Occasionally, fibroids may develop in the fallopian tubes, in the cervix, or on the support structures (ligaments) that surround the uterus. You can have one or many fibroids. Fibroids can vary in size, weight, and where they grow in the uterus. Some can become quite large. Most fibroids do not require medical treatment. CAUSES A fibroid can develop when a single uterine cell keeps growing (replicating). Most cells in the human body have a control mechanism that keeps them from replicating without control. SIGNS AND SYMPTOMS Symptoms may include:   Heavy bleeding during your period.  Bleeding or spotting between periods.  Pelvic pain and pressure.  Bladder problems, such as needing to urinate more often (urinary frequency) or urgently.  Inability to reproduce offspring (infertility).  Miscarriages. DIAGNOSIS Uterine fibroids are diagnosed through a physical exam. Your health care provider may feel the lumpy tumors during a pelvic exam. Ultrasonography and an MRI may be done to determine the size, location, and number of fibroids. TREATMENT Treatment may include:  Watchful waiting. This involves getting the fibroid checked by your health care provider to see if it grows or shrinks. Follow your health care provider's recommendations for how often to have this checked.  Hormone medicines. These can be taken by mouth or given through an intrauterine device (IUD).  Surgery.  Removing the fibroids (myomectomy) or the uterus (hysterectomy).  Removing blood supply to the fibroids (uterine artery embolization). If fibroids interfere with your fertility and you want to become pregnant, your health care provider  may recommend having the fibroids removed.  HOME CARE INSTRUCTIONS  Keep all follow-up visits as directed by your health care provider. This is important.  Take medicines only as directed by your health care provider.  If you were prescribed a hormone treatment, take the hormone medicines exactly as directed.  Do not take aspirin, because it can cause bleeding.  Ask your health care provider about taking iron pills and increasing the amount of dark green, leafy vegetables in your diet. These actions can help to boost your blood iron levels, which may be affected by heavy menstrual bleeding.  Pay close attention to your period and tell your health care provider about any changes, such as:  Increased blood flow that requires you to use more pads or tampons than usual per month.  A change in the number of days that your period lasts per month.  A change in symptoms that are associated with your period, such as abdominal cramping or back pain. SEEK MEDICAL CARE IF:  You have pelvic pain, back pain, or abdominal cramps that cannot be controlled with medicines.  You have an increase in bleeding between and during periods.  You soak tampons or pads in a half hour or less.  You feel lightheaded, extra tired, or weak. SEEK IMMEDIATE MEDICAL CARE IF:  You faint.  You have a sudden increase in pelvic pain.   This information is not intended to replace advice given to you by your health care provider. Make sure you discuss any questions you have with your health care provider.   Document Released: 10/19/2000 Document Revised: 11/12/2014 Document Reviewed: 04/20/2014 Elsevier Interactive Patient Education 2016 [place] Maintenance, Female Adopting a healthy lifestyle  and getting preventive care can go a long way to promote health and wellness. Talk with your health care provider about what schedule of regular examinations is right for you. This is a good chance for you to  check in with your provider about disease prevention and staying healthy. In between checkups, there are plenty of things you can do on your own. Experts have done a lot of research about which lifestyle changes and preventive measures are most likely to keep you healthy. Ask your health care provider for more information. WEIGHT AND DIET  Eat a healthy diet  Be sure to include plenty of vegetables, fruits, low-fat dairy products, and lean protein.  Do not eat a lot of foods high in solid fats, added sugars, or salt.  Get regular exercise. This is one of the most important things you can do for your health.  Most adults should exercise for at least 150 minutes each week. The exercise should increase your heart rate and make you sweat (moderate-intensity exercise).  Most adults should also do strengthening exercises at least twice a week. This is in addition to the moderate-intensity exercise.  Maintain a healthy weight  Body mass index (BMI) is a measurement that can be used to identify possible weight problems. It estimates body fat based on height and weight. Your health care provider can help determine your BMI and help you achieve or maintain a healthy weight.  For females 12 years of age and older:   A BMI below 18.5 is considered underweight.  A BMI of 18.5 to 24.9 is normal.  A BMI of 25 to 29.9 is considered overweight.  A BMI of 30 and above is considered obese.  Watch levels of cholesterol and blood lipids  You should start having your blood tested for lipids and cholesterol at 48 years of age, then have this test every 5 years.  You may need to have your cholesterol levels checked more often if:  Your lipid or cholesterol levels are high.  You are older than 48 years of age.  You are at high risk for heart disease.  CANCER SCREENING   Lung Cancer  Lung cancer screening is recommended for adults 41-73 years old who are at high risk for lung cancer because of a  history of smoking.  A yearly low-dose CT scan of the lungs is recommended for people who:  Currently smoke.  Have quit within the past 15 years.  Have at least a 30-pack-year history of smoking. A pack year is smoking an average of one pack of cigarettes a day for 1 year.  Yearly screening should continue until it has been 15 years since you quit.  Yearly screening should stop if you develop a health problem that would prevent you from having lung cancer treatment.  Breast Cancer  Practice breast self-awareness. This means understanding how your breasts normally appear and feel.  It also means doing regular breast self-exams. Let your health care provider know about any changes, no matter how small.  If you are in your 20s or 30s, you should have a clinical breast exam (CBE) by a health care provider every 1-3 years as part of a regular health exam.  If you are 75 or older, have a CBE every year. Also consider having a breast X-ray (mammogram) every year.  If you have a family history of breast cancer, talk to your health care provider about genetic screening.  If you are at high risk for breast  cancer, talk to your health care provider about having an MRI and a mammogram every year.  Breast cancer gene (BRCA) assessment is recommended for women who have family members with BRCA-related cancers. BRCA-related cancers include:  Breast.  Ovarian.  Tubal.  Peritoneal cancers.  Results of the assessment will determine the need for genetic counseling and BRCA1 and BRCA2 testing. Cervical Cancer Your health care provider may recommend that you be screened regularly for cancer of the pelvic organs (ovaries, uterus, and vagina). This screening involves a pelvic examination, including checking for microscopic changes to the surface of your cervix (Pap test). You may be encouraged to have this screening done every 3 years, beginning at age 85.  For women ages 80-65, health care  providers may recommend pelvic exams and Pap testing every 3 years, or they may recommend the Pap and pelvic exam, combined with testing for human papilloma virus (HPV), every 5 years. Some types of HPV increase your risk of cervical cancer. Testing for HPV may also be done on women of any age with unclear Pap test results.  Other health care providers may not recommend any screening for nonpregnant women who are considered low risk for pelvic cancer and who do not have symptoms. Ask your health care provider if a screening pelvic exam is right for you.  If you have had past treatment for cervical cancer or a condition that could lead to cancer, you need Pap tests and screening for cancer for at least 20 years after your treatment. If Pap tests have been discontinued, your risk factors (such as having a new sexual partner) need to be reassessed to determine if screening should resume. Some women have medical problems that increase the chance of getting cervical cancer. In these cases, your health care provider may recommend more frequent screening and Pap tests. Colorectal Cancer  This type of cancer can be detected and often prevented.  Routine colorectal cancer screening usually begins at 48 years of age and continues through 48 years of age.  Your health care provider may recommend screening at an earlier age if you have risk factors for colon cancer.  Your health care provider may also recommend using home test kits to check for hidden blood in the stool.  A small camera at the end of a tube can be used to examine your colon directly (sigmoidoscopy or colonoscopy). This is done to check for the earliest forms of colorectal cancer.  Routine screening usually begins at age 28.  Direct examination of the colon should be repeated every 5-10 years through 48 years of age. However, you may need to be screened more often if early forms of precancerous polyps or small growths are found. Skin  Cancer  Check your skin from head to toe regularly.  Tell your health care provider about any new moles or changes in moles, especially if there is a change in a mole's shape or color.  Also tell your health care provider if you have a mole that is larger than the size of a pencil eraser.  Always use sunscreen. Apply sunscreen liberally and repeatedly throughout the day.  Protect yourself by wearing long sleeves, pants, a wide-brimmed hat, and sunglasses whenever you are outside. HEART DISEASE, DIABETES, AND HIGH BLOOD PRESSURE   High blood pressure causes heart disease and increases the risk of stroke. High blood pressure is more likely to develop in:  People who have blood pressure in the high end of the normal range (130-139/85-89 mm  Hg).  People who are overweight or obese.  People who are African American.  If you are 97-57 years of age, have your blood pressure checked every 3-5 years. If you are 33 years of age or older, have your blood pressure checked every year. You should have your blood pressure measured twice--once when you are at a hospital or clinic, and once when you are not at a hospital or clinic. Record the average of the two measurements. To check your blood pressure when you are not at a hospital or clinic, you can use:  An automated blood pressure machine at a pharmacy.  A home blood pressure monitor.  If you are between 84 years and 41 years old, ask your health care provider if you should take aspirin to prevent strokes.  Have regular diabetes screenings. This involves taking a blood sample to check your fasting blood sugar level.  If you are at a normal weight and have a low risk for diabetes, have this test once every three years after 48 years of age.  If you are overweight and have a high risk for diabetes, consider being tested at a younger age or more often. PREVENTING INFECTION  Hepatitis B  If you have a higher risk for hepatitis B, you should be  screened for this virus. You are considered at high risk for hepatitis B if:  You were born in a country where hepatitis B is common. Ask your health care provider which countries are considered high risk.  Your parents were born in a high-risk country, and you have not been immunized against hepatitis B (hepatitis B vaccine).  You have HIV or AIDS.  You use needles to inject street drugs.  You live with someone who has hepatitis B.  You have had sex with someone who has hepatitis B.  You get hemodialysis treatment.  You take certain medicines for conditions, including cancer, organ transplantation, and autoimmune conditions. Hepatitis C  Blood testing is recommended for:  Everyone born from [birth year] through 1964.  Anyone with known risk factors for hepatitis C. Sexually transmitted infections (STIs)  You should be screened for sexually transmitted infections (STIs) including gonorrhea and chlamydia if:  You are sexually active and are younger than 48 years of age.  You are older than 48 years of age and your health care provider tells you that you are at risk for this type of infection.  Your sexual activity has changed since you were last screened and you are at an increased risk for chlamydia or gonorrhea. Ask your health care provider if you are at risk.  If you do not have HIV, but are at risk, it may be recommended that you take a prescription medicine daily to prevent HIV infection. This is called pre-exposure prophylaxis (PrEP). You are considered at risk if:  You are sexually active and do not regularly use condoms or know the HIV status of your partner(s).  You take drugs by injection.  You are sexually active with a partner who has HIV. Talk with your health care provider about whether you are at high risk of being infected with HIV. If you choose to begin PrEP, you should first be tested for HIV. You should then be tested every 3 months for as long as you are taking  PrEP.  PREGNANCY   If you are premenopausal and you may become pregnant, ask your health care provider about preconception counseling.  If you may become pregnant, take 400 to 800  micrograms (mcg) of folic acid every day.  If you want to prevent pregnancy, talk to your health care provider about birth control (contraception). OSTEOPOROSIS AND MENOPAUSE   Osteoporosis is a disease in which the bones lose minerals and strength with aging. This can result in serious bone fractures. Your risk for osteoporosis can be identified using a bone density scan.  If you are 17 years of age or older, or if you are at risk for osteoporosis and fractures, ask your health care provider if you should be screened.  Ask your health care provider whether you should take a calcium or vitamin D supplement to lower your risk for osteoporosis.  Menopause may have certain physical symptoms and risks.  Hormone replacement therapy may reduce some of these symptoms and risks. Talk to your health care provider about whether hormone replacement therapy is right for you.  HOME CARE INSTRUCTIONS   Schedule regular health, dental, and eye exams.  Stay current with your immunizations.   Do not use any tobacco products including cigarettes, chewing tobacco, or electronic cigarettes.  If you are pregnant, do not drink alcohol.  If you are breastfeeding, limit how much and how often you drink alcohol.  Limit alcohol intake to no more than 1 drink per day for nonpregnant women. One drink equals 12 ounces of beer, 5 ounces of wine, or 1 ounces of hard liquor.  Do not use street drugs.  Do not share needles.  Ask your health care provider for help if you need support or information about quitting drugs.  Tell your health care provider if you often feel depressed.  Tell your health care provider if you have ever been abused or do not feel safe at home.   This information is not intended to replace advice given  to you by your health care provider. Make sure you discuss any questions you have with your health care provider.   Document Released: 05/07/2011 Document Revised: 11/12/2014 Document Reviewed: 09/23/2013 Elsevier Interactive Patient Education [employer].

## 2015-11-04 NOTE — Progress Notes (Signed)
Tara Bell September 03, 1966 [id number]    History:    Presents for annual exam.  Monthly cycle/BTL. Same partner. History of large fibroids approximately 8 fibroids largest being about 10 cm. Normal ovaries. Age 49 left breast cancer triple negative with lumpectomy, chemotherapy and radiation. Primary care manages hypertension. Normal Pap history.  Past medical history, past surgical history, family history and social history were all reviewed and documented in the EPIC chart. Works at [employer]. Had normal screening labs at work. 2 daughters both doing well.  ROS:  A ROS was performed and pertinent positives and negatives are included.  Exam:  Filed Vitals:   11/04/15 1400  BP: 128/80    General appearance:  Normal Thyroid:  Symmetrical, normal in size, without palpable masses or nodularity. Respiratory  Auscultation:  Clear without wheezing or rhonchi Cardiovascular  Auscultation:  Regular rate, without rubs, murmurs or gallops  Edema/varicosities:  Not grossly evident Abdominal  Soft,nontender, without masses, guarding or rebound.  Liver/spleen:  No organomegaly noted  Hernia:  None appreciated  Skin  Inspection:  Grossly normal   Breasts: Examined lying and sitting.     Right: Without masses, retractions, discharge or axillary adenopathy.     Left: Without masses, retractions, discharge or axillary adenopathy. Gentitourinary   Inguinal/mons:  Normal without inguinal adenopathy  External genitalia:  Normal  BUS/Urethra/Skene's glands:  Normal  Vagina:  Normal  Cervix:  Normal  Uterus:  Enlarged, 20 week size, nontender  Midline and mobile  Adnexa/parametria:     Rt: Without masses or tenderness.   Lt: Without masses or tenderness.  Anus and perineum: Normal  Digital rectal exam: Normal sphincter tone without palpated masses or tenderness  Assessment/Plan:  49 y.o. SBF G2 P2 for annual exam.    Monthly cycle/BTL/menorrhagia Multiple large  fibroids Hypertension-primary care manages Left breast cancer age 30 triple negative lumpectomy/chemotherapy/radiation BRCA negative.]  Plan: Keep scheduled ultrasound appointment and consult with Dr. Toney Rakes in January to discuss possible TAH with BSO. Fearful of  surgery. SBE's, continue annual screening mammogram, calcium rich diet, vitamin D 1000 daily encouraged. Reviewed importance of regular exercise, leisure activities. Reviewed daughters  screening mammogram starting at age 7 , do breast exams now. UA, Pap with HR HPV typing, new screening guidelines reviewed.    Huel Cote [facility], 3:11 PM 11/04/2015

## 2015-11-05 LAB — URINALYSIS W MICROSCOPIC + REFLEX CULTURE
BACTERIA UA: NONE SEEN [HPF]
Bilirubin Urine: NEGATIVE
CASTS: NONE SEEN [LPF]
CRYSTALS: NONE SEEN [HPF]
Glucose, UA: NEGATIVE
Ketones, ur: NEGATIVE
Leukocytes, UA: NEGATIVE
Nitrite: NEGATIVE
PH: 7 (ref 5.0–8.0)
PROTEIN: NEGATIVE
Specific Gravity, Urine: 1.019 (ref 1.001–1.035)
WBC, UA: NONE SEEN WBC/HPF (ref <=5)
YEAST: NONE SEEN [HPF]

## 2015-11-06 LAB — URINE CULTURE: Colony Count: 70000

## 2015-11-07 ENCOUNTER — Other Ambulatory Visit: Payer: Self-pay | Admitting: Women's Health

## 2015-11-08 ENCOUNTER — Ambulatory Visit: Payer: 59

## 2015-11-14 ENCOUNTER — Ambulatory Visit: Payer: 59

## 2015-11-14 ENCOUNTER — Ambulatory Visit
Admission: RE | Admit: 2015-11-14 | Discharge: 2015-11-14 | Disposition: A | Discharge status: Home / Self Care | Payer: 59 | Source: Ambulatory Visit | Attending: Oncology | Admitting: Oncology

## 2015-11-14 DIAGNOSIS — Z1231 Encounter for screening mammogram for malignant neoplasm of breast: Secondary | ICD-10-CM

## 2015-11-17 ENCOUNTER — Ambulatory Visit: Payer: 59 | Admitting: Gynecology

## 2015-11-17 ENCOUNTER — Other Ambulatory Visit: Payer: 59

## 2015-11-23 ENCOUNTER — Other Ambulatory Visit: Payer: Self-pay | Admitting: Orthopedic Surgery

## 2015-11-23 ENCOUNTER — Ambulatory Visit
Admission: RE | Admit: 2015-11-23 | Discharge: 2015-11-23 | Disposition: A | Discharge status: Home / Self Care | Payer: 59 | Source: Ambulatory Visit | Attending: Oncology | Admitting: Oncology

## 2015-11-23 ENCOUNTER — Other Ambulatory Visit: Payer: Self-pay | Admitting: Oncology

## 2015-11-23 DIAGNOSIS — R922 Inconclusive mammogram: Secondary | ICD-10-CM

## 2015-11-23 DIAGNOSIS — Z1501 Genetic susceptibility to malignant neoplasm of breast: Secondary | ICD-10-CM

## 2015-11-23 DIAGNOSIS — N6310 Unspecified lump in the right breast, unspecified quadrant: Secondary | ICD-10-CM

## 2015-11-23 DIAGNOSIS — Z853 Personal history of malignant neoplasm of breast: Secondary | ICD-10-CM

## 2015-11-23 DIAGNOSIS — C50912 Malignant neoplasm of unspecified site of left female breast: Secondary | ICD-10-CM

## 2015-11-23 MED ORDER — GADOBENATE DIMEGLUMINE 529 MG/ML IV SOLN
13.0000 mL | Freq: Once | INTRAVENOUS | Status: AC | PRN
  Administered 2015-11-23: 13 mL via INTRAVENOUS

## 2015-11-24 ENCOUNTER — Telehealth: Payer: Self-pay

## 2015-11-24 NOTE — Telephone Encounter (Signed)
S/w pt re Dr Edwyna Shell message. She will expect call from scheduler.

## 2015-11-24 NOTE — Telephone Encounter (Signed)
-----   Message from Gordy Levan, MD sent at 11/23/2015  6:30 PM EST ----- Labs seen and need follow up: please let patient know that breast MRI does not show anything clearly of concern, however she may have some increase in size of the area that was biopsied upper outer right breast in 2010, benign findings then. The radiologist recommends repeat US of that area since it may be a little larger. I have put in order for the [place] of right breast, so schedulers should let her know that appointment. Otherwise nothing that is different or looks worrisome in either breast on this MRI.

## 2015-11-28 ENCOUNTER — Other Ambulatory Visit: Payer: Self-pay | Admitting: Oncology

## 2015-11-28 ENCOUNTER — Ambulatory Visit
Admission: RE | Admit: 2015-11-28 | Discharge: 2015-11-28 | Disposition: A | Discharge status: Home / Self Care | Payer: 59 | Source: Ambulatory Visit | Attending: Oncology | Admitting: Oncology

## 2015-11-28 ENCOUNTER — Other Ambulatory Visit: Payer: 59

## 2015-11-28 DIAGNOSIS — N6310 Unspecified lump in the right breast, unspecified quadrant: Secondary | ICD-10-CM

## 2015-11-29 ENCOUNTER — Other Ambulatory Visit: Payer: Self-pay | Admitting: Women's Health

## 2015-11-29 DIAGNOSIS — D259 Leiomyoma of uterus, unspecified: Secondary | ICD-10-CM

## 2015-11-29 DIAGNOSIS — N926 Irregular menstruation, unspecified: Secondary | ICD-10-CM

## 2015-11-29 NOTE — Progress Notes (Signed)
Telephone call from patient. States had a normal cycle January 8 through the 15th. Started bleeding again on January 21 like a regular cycle. History of large fibroids. History of left breast cancer, BRCA negative. Has been contemplating hysterectomy but prefers no surgery. Has had occasional spotting between cycles in the past but not consistently. Sonohysterogram with Dr. Toney Rakes will schedule.

## 2015-11-30 ENCOUNTER — Other Ambulatory Visit: Payer: Self-pay | Admitting: Gynecology

## 2015-11-30 DIAGNOSIS — D251 Intramural leiomyoma of uterus: Secondary | ICD-10-CM

## 2015-12-05 ENCOUNTER — Other Ambulatory Visit: Payer: 59

## 2015-12-06 ENCOUNTER — Telehealth: Payer: Self-pay | Admitting: Gynecology

## 2015-12-06 NOTE — Telephone Encounter (Signed)
12/06/15-I LM VM for pt that her [employer] ins will cover the sonohysterogram & bx under her $25.00 copay. Per [email or web address] ..wl

## 2015-12-07 ENCOUNTER — Ambulatory Visit: Payer: 59 | Admitting: Gynecology

## 2015-12-07 ENCOUNTER — Other Ambulatory Visit: Payer: 59

## 2015-12-07 ENCOUNTER — Ambulatory Visit
Admission: RE | Admit: 2015-12-07 | Discharge: 2015-12-07 | Disposition: A | Discharge status: Home / Self Care | Payer: 59 | Source: Ambulatory Visit | Attending: Oncology | Admitting: Oncology

## 2015-12-07 ENCOUNTER — Other Ambulatory Visit: Payer: Self-pay | Admitting: Oncology

## 2015-12-07 DIAGNOSIS — N6310 Unspecified lump in the right breast, unspecified quadrant: Secondary | ICD-10-CM

## 2015-12-12 ENCOUNTER — Encounter: Payer: Self-pay | Admitting: Gynecology

## 2015-12-12 ENCOUNTER — Other Ambulatory Visit: Payer: Self-pay | Admitting: Gynecology

## 2015-12-12 ENCOUNTER — Ambulatory Visit (INDEPENDENT_AMBULATORY_CARE_PROVIDER_SITE_OTHER): Payer: 59 | Admitting: Gynecology

## 2015-12-12 ENCOUNTER — Telehealth: Payer: Self-pay

## 2015-12-12 ENCOUNTER — Ambulatory Visit (INDEPENDENT_AMBULATORY_CARE_PROVIDER_SITE_OTHER): Payer: 59

## 2015-12-12 DIAGNOSIS — N9489 Other specified conditions associated with female genital organs and menstrual cycle: Secondary | ICD-10-CM | POA: Diagnosis not present

## 2015-12-12 DIAGNOSIS — Z1501 Genetic susceptibility to malignant neoplasm of breast: Secondary | ICD-10-CM

## 2015-12-12 DIAGNOSIS — N926 Irregular menstruation, unspecified: Secondary | ICD-10-CM

## 2015-12-12 DIAGNOSIS — D259 Leiomyoma of uterus, unspecified: Secondary | ICD-10-CM

## 2015-12-12 DIAGNOSIS — R938 Abnormal findings on diagnostic imaging of other specified body structures: Secondary | ICD-10-CM

## 2015-12-12 DIAGNOSIS — D251 Intramural leiomyoma of uterus: Secondary | ICD-10-CM | POA: Diagnosis not present

## 2015-12-12 DIAGNOSIS — Z1509 Genetic susceptibility to other malignant neoplasm: Secondary | ICD-10-CM

## 2015-12-12 DIAGNOSIS — R9389 Abnormal findings on diagnostic imaging of other specified body structures: Secondary | ICD-10-CM

## 2015-12-12 DIAGNOSIS — Z853 Personal history of malignant neoplasm of breast: Secondary | ICD-10-CM

## 2015-12-12 NOTE — Progress Notes (Signed)
Patient is a 50 year old who presented to the office today for her follow-up ultrasound because of her history of uterine fibroids and personal history of breast cancer. Review of patient's oncology note from 11/03/2015 described the following:  "follow up of premenopausal triple negative left breast cancer with 9 involved nodes in 2002 (age 13), this in setting of BRCA 2 VUS. She has been on observation thru this office since completing adjuvant chemo and radiation 2003.  Recent concern has been intermittent slight elevation of CA 125 with some irregular and some heavy vaginal bleeding.  She was iron deficient related to gyn blood loss prior to oral iron.  Patient is scheduled for mammograms at [facility] 11-14-15, last done 11-03-14. She has bilateral retropectoral saline implants and heterogeneously dense breast tissue. With high risk features, she had breast MRI 01-08-14. She has noticed no changes in breasts bilaterally .   CA 125 was 37 on 06-01-15 and 28 on repeat 06-30-15. She did not follow up for [place] and discussion of TAH BSO, but is scheduled back to gyn NP tomorrow and to see Dr Toney Rakes in early Jan. She plans to keep those appointments. She had some spotting between periods a few months ago, however last menses were somewhat less heavy and no spotting. She denies abdominal or pelvic discomfort. Bowels are moving regularly, bladder ok, no other bleeding.  She continues oral iron as ferrous fumarate daily, tolerating without difficulty. She is more energetic than prior to iron. She needs to continue iron 5-7 days weekly as long as she is having menses. "  Patient stated this past month and she had 2 menstrual cycles. She is currently on adjuvant taxotere chemotherapy. Previous ultrasounds have demonstrated the following:  2002: uterus with subserous and intramural fibroids 40 x 32 mm, 11 x 10 mm, 28 x 22 mm, 11 x 8 mm, 10 x 6 mm, 11 x 9 mm. Right ovary tubular cystic mass consistent  with hydrosalpinx 22 x 12 x 13 mm negative PFD. Left ovary follicles.  2015: Enlarged anteverted uterus with fibroids intramural and subserous 38 x 47 mm, 44 x 36 mm, 22 x 19 mm, 22 mm, 25 mm, pedunculated left fibroid 11 x 8.3x9.4 cm. Right ovary echo-free cyst 26 x 24 mm with negative CFD, solid cystic mass 25 x 26 mm positive CFD, . Right and left ovaries blood flow noted. Left ovary normal.  CA1 25 was normal  2017:  Uterus measured 12.7 x 10.0 x 6.5 cm with endometrial stripe of 15.9 mm. A left pedunculated fibroid measured 13.2 x 8.4 x 12.4 cm was noted also intramural sutures myomas measuring 35 x 23 mm, 52 x 39 mm, 27 x 50 mm, 51 x 32 mm, 44 x 33 mm, 22 x 15 mm. The cervix was then cleansed with Betadine solution a sterile catheter was introduced into the uterine cavity and intracavitary defect fibroid-like encroaching into the uterine cavity measuring 20 x 11 mm was noted. Right ovary not seen left ovarian follicle 10 x 14 mm negative color flow. No fluid in the cul-de-sac.   Assessment/plan: 50 year old patient with history of left breast cancer positive BRCA2 an enlarging fibroid uterus had been recommended to undergo TAH/BSO in the past but now would like to move forward and schedule accordingly. She has a follow-up appointment with Dr. Fanny Skates general surgeon to discuss her recent stereotactic breast biopsy. I reviewed the pathology report and they stated that it was a benign fibroadenoma. We are going to  schedule her surgery in the next month or so and coordinate with general surgeons in the event that any additional breast biopsy as needed. We will forward a copy of this office note to them as well.

## 2015-12-12 NOTE — Telephone Encounter (Signed)
I called patient to let her know I received order from Dr. Moshe Salisbury to schedule TAH,BSO for her but he did want me to wait until after her visit with Dr. Fanny Skates on Friday to see if any breast surgery is indicated that we might do jointly.  I faxed office note to Dr. Dalbert Batman with a note on cover sheet to please call Dr. Moshe Salisbury after her visit on Friday and I provided our private line phone number.

## 2015-12-12 NOTE — Patient Instructions (Signed)
Abdominal Hysterectomy Abdominal hysterectomy is a surgical procedure to remove your womb (uterus). Your uterus is the muscular organ that contains a developing baby. This surgery is done for many reasons. You may need an abdominal hysterectomy if you have cancer, growths (tumors), long-term pain, or bleeding. You may also have this procedure if your uterus has slipped down into your vagina (uterine prolapse). Depending on why you need an abdominal hysterectomy, you may also have other reproductive organs removed. These could include the part of your vagina that connects with your uterus (cervix), the organs that make eggs (ovaries), and the tubes that connect the ovaries to the uterus (fallopian tubes). LET YOUR HEALTH CARE PROVIDER KNOW ABOUT:   Any allergies you have.  All medicines you are taking, including vitamins, herbs, eye drops, creams, and over-the-counter medicines.  Previous problems you or members of your family have had with the use of anesthetics.  Any blood disorders you have.  Previous surgeries you have had.  Medical conditions you have. RISKS AND COMPLICATIONS Generally, this is a safe procedure. However, as with any procedure, problems can occur. Infection is the most common problem after an abdominal hysterectomy. Other possible problems include:  Bleeding.  Formation of blood clots that may break free and travel to your lungs.  Injury to other organs near your uterus.  Nerve injury causing nerve pain.  Decreased interest in sex or pain during sexual intercourse. BEFORE THE PROCEDURE  Abdominal hysterectomy is a major surgical procedure. It can affect the way you feel about yourself. Talk to your health care provider about the physical and emotional changes hysterectomy may cause.  You may need to have blood work and X-rays done before surgery.  Quit smoking if you smoke. Ask your health care provider for help if you are struggling to quit.  Stop taking  medicines that thin your blood as directed by your health care provider.  You may be instructed to take antibiotic medicines or laxatives before surgery.  Do not eat or drink anything for 6-8 hours before surgery.  Take your regular medicines with a small sip of water.  Bathe or shower the night or morning before surgery. PROCEDURE  Abdominal hysterectomy is done in the operating room at the hospital.  In most cases, you will be given a medicine that makes you go to sleep (general anesthetic).  The surgeon will make a cut (incision) through the skin in your lower belly.  The incision may be about 5-7 inches long. It may go side-to-side or up-and-down.  The surgeon will move aside the body tissue that covers your uterus. The surgeon will then carefully take out your uterus along with any of your other reproductive organs that need to be removed.  Bleeding will be controlled with clamps or sutures.  The surgeon will close your incision with sutures or metal clips. AFTER THE PROCEDURE  You will have some pain immediately after the procedure.  You will be given pain medicine in the recovery room.  You will be taken to your hospital room when you have recovered from the anesthesia.  You may need to stay in the hospital for 2-5 days.  You will be given instructions for recovery at home.   This information is not intended to replace advice given to you by your health care provider. Make sure you discuss any questions you have with your health care provider.   Document Released: 10/27/2013 Document Reviewed: 10/27/2013 Elsevier Interactive Patient Education 2016 Elsevier Inc. Uterine Fibroids   Uterine fibroids are tissue masses (tumors) that can develop in the womb (uterus). They are also called leiomyomas. This type of tumor is not cancerous (benign) and does not spread to other parts of the body outside of the pelvic area, which is between the hip bones. Occasionally, fibroids may  develop in the fallopian tubes, in the cervix, or on the support structures (ligaments) that surround the uterus. You can have one or many fibroids. Fibroids can vary in size, weight, and where they grow in the uterus. Some can become quite large. Most fibroids do not require medical treatment. CAUSES A fibroid can develop when a single uterine cell keeps growing (replicating). Most cells in the human body have a control mechanism that keeps them from replicating without control. SIGNS AND SYMPTOMS Symptoms may include:   Heavy bleeding during your period.  Bleeding or spotting between periods.  Pelvic pain and pressure.  Bladder problems, such as needing to urinate more often (urinary frequency) or urgently.  Inability to reproduce offspring (infertility).  Miscarriages. DIAGNOSIS Uterine fibroids are diagnosed through a physical exam. Your health care provider may feel the lumpy tumors during a pelvic exam. Ultrasonography and an MRI may be done to determine the size, location, and number of fibroids. TREATMENT Treatment may include:  Watchful waiting. This involves getting the fibroid checked by your health care provider to see if it grows or shrinks. Follow your health care provider's recommendations for how often to have this checked.  Hormone medicines. These can be taken by mouth or given through an intrauterine device (IUD).  Surgery.  Removing the fibroids (myomectomy) or the uterus (hysterectomy).  Removing blood supply to the fibroids (uterine artery embolization). If fibroids interfere with your fertility and you want to become pregnant, your health care provider may recommend having the fibroids removed.  HOME CARE INSTRUCTIONS  Keep all follow-up visits as directed by your health care provider. This is important.  Take medicines only as directed by your health care provider.  If you were prescribed a hormone treatment, take the hormone medicines exactly as  directed.  Do not take aspirin, because it can cause bleeding.  Ask your health care provider about taking iron pills and increasing the amount of dark green, leafy vegetables in your diet. These actions can help to boost your blood iron levels, which may be affected by heavy menstrual bleeding.  Pay close attention to your period and tell your health care provider about any changes, such as:  Increased blood flow that requires you to use more pads or tampons than usual per month.  A change in the number of days that your period lasts per month.  A change in symptoms that are associated with your period, such as abdominal cramping or back pain. SEEK MEDICAL CARE IF:  You have pelvic pain, back pain, or abdominal cramps that cannot be controlled with medicines.  You have an increase in bleeding between and during periods.  You soak tampons or pads in a half hour or less.  You feel lightheaded, extra tired, or weak. SEEK IMMEDIATE MEDICAL CARE IF:  You faint.  You have a sudden increase in pelvic pain.   This information is not intended to replace advice given to you by your health care provider. Make sure you discuss any questions you have with your health care provider.   Document Released: 10/19/2000 Document Revised: 11/12/2014 Document Reviewed: 04/20/2014 Elsevier Interactive Patient Education 2016 Elsevier Inc.  

## 2015-12-16 ENCOUNTER — Telehealth: Payer: Self-pay

## 2015-12-16 ENCOUNTER — Other Ambulatory Visit (HOSPITAL_BASED_OUTPATIENT_CLINIC_OR_DEPARTMENT_OTHER): Payer: Self-pay | Admitting: General Surgery

## 2015-12-16 NOTE — Telephone Encounter (Signed)
Pt called from Dr Darrel Hoover office. Dr Dalbert Batman is giving the pt the option of having the knot in her breast removed. Pt states his opinion is not to get the knot removed. Pt is requesting Dr Edwyna Shell give her opinion. 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  I called pt back to let her know Dr Edwyna Shell is not in office this afternoon so expect an answer about Monday.

## 2015-12-19 ENCOUNTER — Telehealth: Payer: Self-pay

## 2015-12-19 NOTE — Telephone Encounter (Signed)
Left message for patient to call regarding scheduling (surgery).

## 2015-12-22 NOTE — Telephone Encounter (Signed)
Please request Dr Darrel Hoover office note  Since the area in breast is benign fibroadenoma, not cancer, she does not have to have it removed surgically now.  I see Dr Toney Rakes is planning hysterectomy, which I think is a good idea.  Thank you

## 2015-12-26 NOTE — Telephone Encounter (Signed)
Spoke with Ms. Tara Bell regarding Dr. Mariana Kaufman opinion noted below regarding breast surgery. Received Dr. Darrel Hoover office note form visit and sent copy to HIM to be scanned in to patient's EMR and gave original to Dr. Marko Plume for review.

## 2016-01-11 ENCOUNTER — Telehealth: Payer: Self-pay

## 2016-01-11 NOTE — Telephone Encounter (Signed)
I called patient and left a detailed message on voice mail per DPR access note to let her know that Dr. Moshe Salisbury had to have surgery this week and will be out for recovery x six weeks.  I wanted to let her know that I am scheduling surgery in May now and I have my block time on May 16th open if she is interested. I explained I was not trying to rush her just wanted to make her aware that I am scheduling a bit further out than usual because of the circumstances.

## 2016-01-24 ENCOUNTER — Other Ambulatory Visit: Payer: Self-pay

## 2016-01-24 DIAGNOSIS — D509 Iron deficiency anemia, unspecified: Secondary | ICD-10-CM

## 2016-01-24 MED ORDER — FERROUS FUMARATE 325 (106 FE) MG PO TABS: ORAL_TABLET | ORAL | Status: DC

## 2016-01-30 ENCOUNTER — Telehealth: Payer: Self-pay

## 2016-01-30 NOTE — Telephone Encounter (Signed)
Received request from Tara Bell asking if they can substitute generic for hemocyte. LVM on [facility] rx line that per the prescription, it is OK to prescribe ferrous fumerate for the hemocyte.

## 2016-06-08 ENCOUNTER — Telehealth: Payer: Self-pay

## 2016-06-08 NOTE — Telephone Encounter (Signed)
Tara Bell's  left elbow has been sore for ~1 month. She did not know if she needed to see PCP or Dr. Marko Plume. She has had lymphedema in left arm and treatment for this from her breast cancer surgery. She stated that the elbow was beginning to feel better the last couple of days. She does not wear her compression sleeve often.   She has not flown recently nor any strenuous exercisie involving the left arm.   Suggested she wear her compression sleeve between now and visit with Dr. Marko Plume on 07-05-16(Pt not aware of this appointment). Tara Cela in agreement with plan.

## 2016-06-23 ENCOUNTER — Other Ambulatory Visit: Payer: Self-pay | Admitting: Women's Health

## 2016-06-25 ENCOUNTER — Encounter: Payer: Self-pay | Admitting: Genetic Counselor

## 2016-06-26 ENCOUNTER — Ambulatory Visit: Payer: 59 | Admitting: Women's Health

## 2016-07-05 ENCOUNTER — Ambulatory Visit: Payer: 59 | Admitting: Oncology

## 2016-07-05 ENCOUNTER — Other Ambulatory Visit: Payer: 59

## 2016-07-13 ENCOUNTER — Other Ambulatory Visit: Payer: Self-pay

## 2016-07-13 DIAGNOSIS — C50912 Malignant neoplasm of unspecified site of left female breast: Secondary | ICD-10-CM

## 2016-07-15 ENCOUNTER — Other Ambulatory Visit: Payer: Self-pay | Admitting: Oncology

## 2016-07-16 ENCOUNTER — Ambulatory Visit (HOSPITAL_BASED_OUTPATIENT_CLINIC_OR_DEPARTMENT_OTHER): Payer: 59

## 2016-07-16 ENCOUNTER — Ambulatory Visit (HOSPITAL_BASED_OUTPATIENT_CLINIC_OR_DEPARTMENT_OTHER): Payer: 59 | Admitting: Oncology

## 2016-07-16 ENCOUNTER — Encounter: Payer: Self-pay | Admitting: Oncology

## 2016-07-16 VITALS — BP 131/94 | HR 66 | Temp 98.4°F | Resp 18 | Ht 60.0 in | Wt 149.3 lb

## 2016-07-16 DIAGNOSIS — C50912 Malignant neoplasm of unspecified site of left female breast: Secondary | ICD-10-CM

## 2016-07-16 DIAGNOSIS — H04553 Acquired stenosis of bilateral nasolacrimal duct: Secondary | ICD-10-CM

## 2016-07-16 DIAGNOSIS — I890 Lymphedema, not elsewhere classified: Secondary | ICD-10-CM

## 2016-07-16 DIAGNOSIS — Z853 Personal history of malignant neoplasm of breast: Secondary | ICD-10-CM | POA: Diagnosis not present

## 2016-07-16 DIAGNOSIS — D259 Leiomyoma of uterus, unspecified: Secondary | ICD-10-CM

## 2016-07-16 DIAGNOSIS — D261 Other benign neoplasm of corpus uteri: Secondary | ICD-10-CM | POA: Diagnosis not present

## 2016-07-16 DIAGNOSIS — D251 Intramural leiomyoma of uterus: Secondary | ICD-10-CM

## 2016-07-16 DIAGNOSIS — N920 Excessive and frequent menstruation with regular cycle: Secondary | ICD-10-CM

## 2016-07-16 DIAGNOSIS — R922 Inconclusive mammogram: Secondary | ICD-10-CM

## 2016-07-16 LAB — CBC WITH DIFFERENTIAL/PLATELET
BASO%: 0.3 % (ref 0.0–2.0)
BASOS ABS: 0 10*3/uL (ref 0.0–0.1)
EOS%: 1.1 % (ref 0.0–7.0)
Eosinophils Absolute: 0.1 10*3/uL (ref 0.0–0.5)
HEMATOCRIT: 39 % (ref 34.8–46.6)
HEMOGLOBIN: 13 g/dL (ref 11.6–15.9)
LYMPH#: 1.7 10*3/uL (ref 0.9–3.3)
LYMPH%: 23.4 % (ref 14.0–49.7)
MCH: 29.1 pg (ref 25.1–34.0)
MCHC: 33.3 g/dL (ref 31.5–36.0)
MCV: 87.2 fL (ref 79.5–101.0)
MONO#: 0.5 10*3/uL (ref 0.1–0.9)
MONO%: 7.2 % (ref 0.0–14.0)
NEUT#: 4.9 10*3/uL (ref 1.5–6.5)
NEUT%: 68 % (ref 38.4–76.8)
PLATELETS: 243 10*3/uL (ref 145–400)
RBC: 4.47 10*6/uL (ref 3.70–5.45)
RDW: 14.4 % (ref 11.2–14.5)
WBC: 7.3 10*3/uL (ref 3.9–10.3)

## 2016-07-16 LAB — COMPREHENSIVE METABOLIC PANEL
ALBUMIN: 3.7 g/dL (ref 3.5–5.0)
ALK PHOS: 91 U/L (ref 40–150)
ALT: 21 U/L (ref 0–55)
ANION GAP: 11 meq/L (ref 3–11)
AST: 21 U/L (ref 5–34)
BUN: 11.7 mg/dL (ref 7.0–26.0)
CALCIUM: 9.7 mg/dL (ref 8.4–10.4)
CO2: 25 mEq/L (ref 22–29)
Chloride: 103 mEq/L (ref 98–109)
Creatinine: 0.9 mg/dL (ref 0.6–1.1)
Glucose: 85 mg/dl (ref 70–140)
POTASSIUM: 3.7 meq/L (ref 3.5–5.1)
Sodium: 139 mEq/L (ref 136–145)
Total Bilirubin: <0.3 mg/dL (ref 0.20–1.20)
Total Protein: 8.5 g/dL — ABNORMAL HIGH (ref 6.4–8.3)

## 2016-07-16 NOTE — Progress Notes (Signed)
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 She has had no known recurrent disease since left partial mastectomy and adjuvant chemo and radiation completed in 2003. She has BRCA 2 VUS.  Last mammograms were bilateral 11-22-15 with bilateral breast MRI 11-23-15, then right breast biopsy 12-2015 showing fibroadenoma.    In past year she had heavy and irregular vaginal bleeding, with slight elevation of CA 125 in 05-2015 which was back in normal range 06-2015. She was treated for iron deficiency anemia related to heavy gyn blood loss. She had sonohystogram and pelvic US 12-2015 with apparent large uterine fibroid. Dr Toney Rakes recommended hysterectomy BSO, which patient has not yet scheduled. She tells me that she plans to have this done. Menstrual bleeding has been much lighter for past several months, and hemoglobin is back up to 13 today. I do NOT find that she had adjuvant tamoxifen, note triple negative breast CA.   She has slight lymphedema LUE related to breast cancer treatment. This has been more bothersome recently, with soreness around left elbow. 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 She has not worn compression sleeve in several years (?). She is willing to be referred back to lymphedema PT for evaluation and treatment, hopefully to include another compression garment.   She has otherwise felt well, with no recent infectious illness, no new or different pain otherwise, no changes in either breast that she can tell. Appetite and energy are good, no GI symptoms, no other bleeding, no respiratory symptoms, no swelling LE.  Excessive lacrimation since taxotere unchanged, moreso right than left eye; she does not want  reevaluation by ophthalmology, as stents were difficult to tolerate previously.  Remainder of 10 point Review of Systems negative    No PAC BRCA 2 truncation by testing 2010.  Updated testing in 2014, which found the original BRCA2 VUS with additional VUS in PTEN and TP53. Both the TP53 and PTEN VUS have been reclassified as a benign polymorphism, but the BRCA2 VUS is still a VUS, seen  5X per genetics counselor update 07-19-15. Bilateral retropectoral saline implants.      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 The tumor was triple negative (ER/PR negative and HER 2 1+ ,CISH not done in 2002), treated with left partial mastectomy with axillary node evaluation by Dr.Streck, adjuvant chemotherapy with adriamycin/cytoxan x 4 followed by taxotere weekly x 12 by Dr. Starr Sinclair, radiation to breast/axilla/supraclavicular region by Dr.Kinard, and since then on observation without known active disease. 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 She had BRCA testing done on research protocol at [facility] in 2010, which found truncation of BRCA 2. Last bilateral mammograms were at [facility] 10-28-12 with breast tissue still extremely dense but no mammographic findings of concern; she has submuscular saline implants bilaterally. Last breast MRI was 11-14-11. With the BRCA 2 abnormality (tho VUS),  extremely dense breast tissue and this history,  yearly MRI is felt medically necessary   Objective:  Vital signs in last 24 hours:  BP (!) 131/94 (BP Location: Left Arm,  Patient Position: Sitting)   Pulse 66   Temp 98.4 F (36.9 C) (Oral)   Resp 18   Ht 5' (1.524 m)   Wt 149 lb 4.8 oz (67.7 kg)   SpO2 100%   BMI 29.16 kg/m  Weight up 5 lbs.  Alert, oriented and appropriate. Ambulatory without  difficulty.  Normal hair pattern.  HEENT:PERRL, sclerae not icteric. Oral mucosa moist without lesions, posterior pharynx clear. Slight tearing OD as baseline since taxotere Neck supple. No JVD.  Lymphatics:no cervical,spuraclavicular, axillary or inguinal adenopathy Resp: clear to auscultation bilaterally and normal percussion bilaterally Cardio: regular rate and rhythm. No gallop. GI: soft, nontender, not distended, no mass or organomegaly. Normally active bowel sounds. Surgical incision not remarkable. Musculoskeletal/ Extremities: UE/ LE without pitting edema, cords, tenderness. Slight swelling without pitting left forearm, no erythema or evidence of cellulitis, full ROM including elbow.  Neuro: no peripheral neuropathy. Otherwise nonfocal. PSYCH appropriate mood and affect Skin without rash, ecchymosis, petechiae Breasts: Left surgical scars well healed, without dominant mass, skin or nipple findings, note retropectoral implant. . Right breast without palpable mass, no skin or nipple findings of concern, also note retropectoral implant. Axillae benign. Portacath-without erythema or tenderness  Lab Results:  Results for orders placed or performed in visit on 07/16/16  CBC with Differential  Result Value Ref Range   WBC 7.3 3.9 - 10.3 10e3/uL   NEUT# 4.9 1.5 - 6.5 10e3/uL   HGB 13.0 11.6 - 15.9 g/dL   HCT 39.0 34.8 - 46.6 %   Platelets 243 145 - 400 10e3/uL   MCV 87.2 79.5 - 101.0 fL   MCH 29.1 25.1 - 34.0 pg   MCHC 33.3 31.5 - 36.0 g/dL   RBC 4.47 3.70 - 5.45 10e6/uL   RDW 14.4 11.2 - 14.5 %   lymph# 1.7 0.9 - 3.3 10e3/uL   MONO# 0.5 0.1 - 0.9 10e3/uL   Eosinophils Absolute 0.1 0.0 - 0.5 10e3/uL   Basophils Absolute 0.0 0.0 - 0.1 10e3/uL   NEUT% 68.0 38.4 - 76.8 %   LYMPH% 23.4 14.0 - 49.7 %   MONO% 7.2 0.0 - 14.0 %   EOS% 1.1 0.0 - 7.0 %   BASO% 0.3 0.0 - 2.0 %  Comprehensive metabolic panel  Result Value Ref Range   Sodium 139 136 - 145 mEq/L   Potassium 3.7 3.5 - 5.1  mEq/L   Chloride 103 98 - 109 mEq/L   CO2 25 22 - 29 mEq/L   Glucose 85 70 - 140 mg/dl   BUN 11.7 7.0 - 26.0 mg/dL   Creatinine 0.9 0.6 - 1.1 mg/dL   Total Bilirubin <0.30 0.20 - 1.20 mg/dL   Alkaline Phosphatase 91 40 - 150 U/L   AST 21 5 - 34 U/L   ALT 21 0 - 55 U/L   Total Protein 8.5 (H) 6.4 - 8.3 g/dL   Albumin 3.7 3.5 - 5.0 g/dL   Calcium 9.7 8.4 - 10.4 mg/dL   Anion Gap 11 3 - 11 mEq/L   EGFR >90 >90 ml/min/1.73 m2     Studies/Results reviewed in EMR: Sonohystogram and pelvic US 12-2015 Bilateral mammograms 11-14-15, MR breasts, right [place] and diagnostic mammograms 11-2015  Pathology JERZIE, BIERI S Collected: 12/07/2015 Client: [facility] Accession: [id number] Received: 12/07/2015 Ammie Ferrier, MDURGICAL PATHOLOGFINAL DIAGNOSIS Diagnosis Breast, right, needle core biopsy, 11:00 o'clock - FIBROADENOMA. - NO MALIGNANT FEATURES IDENTIFIED.    LAYAH, SKOUSEN S Collected: 12/12/2015 Client: [facility] Accession:  [id number] Received: 12/12/2015 Uvaldo Rising, MD REPORT OF SURGICAL PATHOLOGY FINAL DIAGNOSIS Diagnosis Endometrium, biopsy, uterus - PROLIFERATIVE ENDOMETRIUM WITH BREAK DOWN. - NO HYPERPLASIA, ATYPIA OR MALIGNANCY IDENTIFIED.   Medications: I have reviewed the patient's current medications. She does not need to take oral iron regularly now, tho have suggested that she continue at least around any heavy gyn bleeding  She prefers not to have flu vaccine  DISCUSSION Some increase in lymphedema LUE likely causing or contributing to discomfort around elbow. Will refer back to lymphedema PT for evaluation and treatment, likely to include compression sleeve (which she should be able to wear at least in cool weather) She is comfortable with the consultation that she had with Dr Dalbert Batman, did not want any other surgery for what is thought a benign fibroadenoma of right breast. Dr Dalbert Batman is glad to see her in  future if requested.  She should have bilateral mammograms in 11-2016. I would still prefer breast MRI within 3 months of the mammograms for reasons as above. Suggest follow up by breast medical oncologist in ~ 6 months, which should be after the mammograms.   Encouraged her to follow up with Dr Toney Rakes, tho fortunately gyn bleeding has not been extremely heavy for last several months. As she is 39 and with the breast cancer history including BRCA VUS, I agree with his recommendation for BSO with the hysterectomy.     Assessment/Plan:  1.Personal history of T2 N1 (9 node +) triple negative left breast carcinoma 2002: clinically doing well. She has BRCA 2 truncation of uncertain significance. Breast MRI medically appropriate within 3 months after next mammograms due to high risk patient with extremely dense breast tissue and some BRCA 2 abnormality. Note she has not been interested in prophylactic mastectomies. Bilateral tomo mammograms due 11-2016. 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 Seen by Dr Dalbert Batman, no further surgery.  3.iron deficiency anemia with history of heavy menstrual bleeding ongoing: anemia resolved. Still would need to check for anemia if significant bleeding in future. 4.uterine fibroids and menorrhagia: I have again strongly encouraged her consider hysterectomy. WIth her breast cancer history and BRCA 2 finding, BSO would be appropriate with the hysterectomy.  5.lymphedema LUE related to breast cancer treatment. Referral back to lymphedema PT. 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 Time spent 25 min including >50% counseling and coordination of care.    Evlyn Clines, MD   07/16/2016, 7:58 PM

## 2016-07-20 ENCOUNTER — Telehealth: Payer: Self-pay | Admitting: *Deleted

## 2016-07-20 NOTE — Telephone Encounter (Signed)
"  I was seen and left without scheduling next F/U.  I need scheduling to schedule F/U and I need a Physical Therapy appointment for swelling and pain to my elbow."  An 8 month F/U request visible in 07-16-2016 office note.  Will notify provider of PT request.

## 2016-07-24 NOTE — Telephone Encounter (Signed)
Told Tara Bell that Dr. Marko Plume  Is aware that the PT referral has not been done.  She is working towards completed her office note from 07-16-16 and sending referral to Lymphedema clinic. Tara Bell will continue with Ibuprofen. Heat is not helping with discomfort. Suggested that she try applying ice. Tara Bell verbalized understanding. Told her to call back to the office if she has not heard from the Lymphedema clinic by 07-27-16.

## 2016-07-26 ENCOUNTER — Other Ambulatory Visit: Payer: Self-pay | Admitting: Women's Health

## 2016-07-28 ENCOUNTER — Other Ambulatory Visit: Payer: Self-pay | Admitting: Oncology

## 2016-07-28 DIAGNOSIS — Z853 Personal history of malignant neoplasm of breast: Secondary | ICD-10-CM | POA: Insufficient documentation

## 2016-07-28 DIAGNOSIS — I890 Lymphedema, not elsewhere classified: Secondary | ICD-10-CM | POA: Insufficient documentation

## 2016-07-28 DIAGNOSIS — H04559 Acquired stenosis of unspecified nasolacrimal duct: Secondary | ICD-10-CM | POA: Insufficient documentation

## 2016-07-28 DIAGNOSIS — Z1239 Encounter for other screening for malignant neoplasm of breast: Secondary | ICD-10-CM

## 2016-07-28 DIAGNOSIS — R922 Inconclusive mammogram: Secondary | ICD-10-CM

## 2016-07-30 ENCOUNTER — Telehealth: Payer: Self-pay

## 2016-07-30 ENCOUNTER — Other Ambulatory Visit: Payer: Self-pay | Admitting: *Deleted

## 2016-07-30 ENCOUNTER — Telehealth: Payer: Self-pay | Admitting: *Deleted

## 2016-07-30 DIAGNOSIS — C50019 Malignant neoplasm of nipple and areola, unspecified female breast: Secondary | ICD-10-CM

## 2016-07-30 DIAGNOSIS — I890 Lymphedema, not elsewhere classified: Secondary | ICD-10-CM

## 2016-07-30 NOTE — Telephone Encounter (Signed)
Told Ms Munro that the Lymphedema clinic referral was made today.  Gave her phone number to scheduler if she wants to call for appointment today or f/u if she has not received a call in a couple of days. If further issues with scheduling, Ms Favero will call back to Dr. Mariana Kaufman nurse.

## 2016-07-30 NOTE — Telephone Encounter (Signed)
-----   Message from Gordy Levan, MD sent at 07/28/2016  1:54 PM EDT ----- Sorry, after I said I would do referral to lymphedema PT, again I cannot get referral to go thru EMR,   Please could RN try on 9-25, otherwise I can call that office when they are open: Contact # where Kalman Shan helps me is [phone]  I did not put in "POF" for lymphedema PT yet either  Thank you

## 2016-07-30 NOTE — Telephone Encounter (Signed)
Received a call from [facility] stating that patient MRI order needs to be changed to "MRI breast W/WO contrast." 

## 2016-07-31 ENCOUNTER — Other Ambulatory Visit: Payer: Self-pay

## 2016-07-31 DIAGNOSIS — R922 Inconclusive mammogram: Secondary | ICD-10-CM

## 2016-07-31 DIAGNOSIS — Z1239 Encounter for other screening for malignant neoplasm of breast: Secondary | ICD-10-CM

## 2016-07-31 NOTE — Telephone Encounter (Signed)
Ordered corrected order request for MRI as noted below.

## 2016-08-06 ENCOUNTER — Ambulatory Visit: Payer: 59 | Admitting: Physical Therapy

## 2016-08-20 ENCOUNTER — Ambulatory Visit: Payer: 59 | Attending: Oncology | Admitting: Physical Therapy

## 2016-08-20 DIAGNOSIS — M25522 Pain in left elbow: Secondary | ICD-10-CM

## 2016-08-20 DIAGNOSIS — I890 Lymphedema, not elsewhere classified: Secondary | ICD-10-CM | POA: Diagnosis present

## 2016-08-20 NOTE — Therapy (Signed)
[place], [place], [address] Phone: [phone]   Fax:  [phone]  Physical Therapy Evaluation  Patient Details  Name: Tara Bell MRN: [id number] Date of Birth: [birth year] Referring Provider: Evlyn Clines, MD  Encounter Date: 08/20/2016      PT End of Session - 08/20/16 1038    Visit Number 1   Number of Visits 9   Date for PT Re-Evaluation 09/21/16   PT Start Time 0850   PT Stop Time 0927   PT Time Calculation (min) 37 min   Activity Tolerance Patient tolerated treatment well   Behavior During Therapy [facility] for tasks assessed/performed      Past Medical History:  Diagnosis Date  . BRCA1 negative 08/2011  . 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 Breast cancer ([place]) SEPTEMBER 2002   LEFT BREAST-SURGERY,CHEMO,RADIATION-DR.LIVESAY  . Hypertension   . 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 Lymphedema of arm    DUE TO LEFT   AXILLARY NODE DISSECTION   AND RADIATION  . Migraines   . S/P cardiac catheterization, with normal coronary arteries and normal EF, 07/01/13 07/13/2013    Past Surgical History:  Procedure Laterality Date  . AUGMENTATION MAMMAPLASTY  2008  . BILATERAL TUBAL    . BREAST LUMPECTOMY     AE 35  . CARDIAC CATHETERIZATION  07/01/13   Angiographically normal coronary arteries with normal left atrial function  . HYSTEROSCOPY  12/10/07   RESECTOSCOPIC POLYPECTOMY/MYOMECTOMY  . LEFT HEART CATHETERIZATION WITH CORONARY ANGIOGRAM N/A 07/01/2013   Procedure: LEFT HEART CATHETERIZATION WITH CORONARY ANGIOGRAM;  Surgeon: Leonie Man, MD;  Location: [facility] CATH LAB;  Service: Cardiovascular;  Laterality: N/A;  . MASTECTOMY, PARTIAL  07/23/2001   LEFT BREAST WITH AXILLARY NODE EVALUATION  . TUBAL LIGATION     BILATERAL    There were no vitals filed for this visit.       Subjective Assessment - 08/20/16 0855    Subjective  "I've been having problems with my elbow on the left side where my lymphedema is for about 2 months. I've had lymphedema since my surgery in 2002. 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 She completed chemotherapy and radiation.   Patient Stated Goals to get the pain in elbow to go away   Currently in Pain? Yes   Pain Score 4    Pain Location Elbow   Pain Orientation Left   Pain Descriptors / Indicators Aching   Pain Type Acute pain   Pain Onset Other (comment)  2 months ago   Pain Frequency Occasional   Aggravating Factors  using left arm a lot, carrying heavy objects   Pain Relieving Factors nothing   Effect of Pain on Daily Activities has to keep arm straight when sleeping because bending it hurts            [facility] PT Assessment - 08/20/16 0001      Assessment   Medical Diagnosis left breast cancer   Referring Provider Evlyn Clines, MD   Onset Date/Surgical Date 07/07/01  approximate   Hand Dominance Right     Precautions   Precautions None     Restrictions   Weight Bearing Restrictions No  Balance Screen   Has the patient fallen in the past 6 months No   Has the patient had a decrease in activity level because of a fear of falling?  No   Is the patient reluctant to leave their home because of a fear of falling?  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   Leisure no  regular exercise, occasionally will do a lot of walking or use stationary bike     Cognition   Overall Cognitive Status Within Functional Limits for tasks assessed     Observation/Other Assessments   Observations noticeable swelling in left UE; tender to palpation at left lateral elbow/origin of left wrist extensors   Other Surveys  --  LLIS: 22% impairment     Posture/Postural Control   Posture Comments rounded shoulders and forward head     ROM / Strength   AROM / PROM / Strength AROM;Strength     AROM   Overall AROM Comments bilateral shoulder and elbow AROM WFL for all motions; active and passive left elbow flexion painful     Strength   Overall Strength Comments bilateral elbow flexion and extension strength 5/5 but with pt reporting pain in left elbow with resisted left elbow flexion and extension     Ambulation/Gait   Ambulation/Gait Yes   Ambulation/Gait Assistance 7: Independent           LYMPHEDEMA/ONCOLOGY QUESTIONNAIRE - 08/20/16 0904      Lymphedema Assessments   Lymphedema Assessments Upper extremities     Right Upper Extremity Lymphedema   15 cm Proximal to Olecranon Process 30.7 cm   10 cm Proximal to Olecranon Process 30.2 cm   Olecranon Process 25.2 cm   15 cm Proximal to Ulnar Styloid Process 24.7 cm   10 cm Proximal to Ulnar Styloid Process 21.1 cm   Just Proximal to Ulnar Styloid Process 15.1 cm   Across Hand at [employer] 18 cm   At [place] of 2nd Digit 5.8 cm     Left Upper Extremity Lymphedema   15 cm Proximal to Olecranon Process 30.5 cm   10 cm Proximal to Olecranon Process 30.2 cm   Olecranon Process 26.4 cm   15 cm Proximal to Ulnar Styloid Process 24.5 cm   10 cm Proximal to Ulnar Styloid Process 21.7 cm   Just Proximal to Ulnar Styloid Process 17.2 cm   Across Hand at [employer] 18.3 cm   At [place] of 2nd Digit 5.6 cm                                Long Term Clinic Goals - 08/20/16 1047      CC Long  Term Goal  #1   Title Patient will report a 50% improvement in the pain in her left elbow for improved use of left UE for ADLs.   Time 4   Period Weeks   Status New     CC Long Term Goal  #  2   Title Patient will be independent in self manual lymph drainage.   Time 4   Period Weeks   Status New     CC Long Term Goal  #3   Title Reduce circumference measurement just proximal to left ulnar styloid to 16 cm or less.   Time 4   Period Weeks   Status New     CC Long Term Goal  #4   Title Reduce circumference measurement at left olecranon process to 25 cm or less.   Time 4   Period Weeks   Status New     CC Long Term Goal  #5   Title Patient will be knowledgeable about lymphedema risk reduction practices.   Time 4   Period Weeks   Status New     CC Long Term Goal  #6   Title Patient will obtain compression garment and be able to don/doff independently.   Time 4   Period Weeks   Status New     Additional Goals   Additional Goals Yes            Plan - 08/20/16 1039    Clinical Impression Statement Patient is a 50 year old female s/p left lumpectomy with axillary lymph node dissection in September 2002. Patient presents today with left UE lymphedema and left elbow pain. She demonstrates increased swelling in left UE and tender to palpation at origin of left wrist extensors at left elbow. She would benefit from manual lymph drainage to reduce the left UE lymphedema, as well as soft tissue techniques, iontophoresis, and left UE strengthening to address the left elbow pain.   Rehab Potential Good   Clinical Impairments Affecting Rehab Potential previous axillary lymph node dissection and radiation   PT Frequency 2x / week   PT Duration 4 weeks   PT Treatment/Interventions ADLs/Self Care Home Management;Cryotherapy;Electrical Stimulation;Iontophoresis '4mg'$ /ml Dexamethasone;Therapeutic exercise;Therapeutic activities;DME Instruction;Patient/family education;Manual techniques;Manual  lymph drainage;Compression bandaging;Scar mobilization;Passive range of motion;Taping   PT Next Visit Plan Begin manual lymph drainage to left UE; cross-friction massage at origin of left wrist extensors/lateral left elbow; iontophoresis if approved by MD   Consulted and Agree with Plan of Care Patient      Patient will benefit from skilled therapeutic intervention in order to improve the following deficits and impairments:  Increased edema, Impaired UE functional use, Postural dysfunction, Pain  Visit Diagnosis: Lymphedema, not elsewhere classified  Pain in left elbow     Problem List Patient Active Problem List   Diagnosis Date Noted  . 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 Lymphedema of arm 07/28/2016  . Personal history of malignant neoplasm of breast 07/28/2016  . Genetic testing 07/19/2015  . Dense breast tissue 07/03/2015  . Elevated CA-125 07/03/2015  . Iron deficiency anemia due to chronic blood loss 07/03/2015  . Screening mammogram for high-risk patient 07/03/2015  . History of antineoplastic chemotherapy 10/31/2014  . Palpitations 04/19/2014  . Easy bruising 04/19/2014  . Fibroid uterus 01/26/2014  . Elevated troponin I level, mildly secondary to HTN 07/13/2013  . S/P cardiac catheterization, with normal coronary arteries and normal EF, 07/01/13 07/13/2013  . Hyperlipidemia LDL goal < 100, normal coronary arteries. 07/13/2013  . Chest pain 06/30/2013  . Fibroids, intramural 10/24/2012  . BRCA1 negative   . BRCA2 negative   . Hypertension   . Breast cancer ([place]) 07/06/2001    Mellody Life 08/20/2016, 10:51 AM  [place], [place],  [address] Phone: [phone]   Fax:  [phone]  Name: Tara Bell MRN: [id number] Date of Birth: 12/03/1965  Saverio Danker, SPT  Read, reviewed, edited and agree with student's findings and recommendations.   Serafina Royals,  PT 08/20/16 1:25 PM

## 2016-08-21 ENCOUNTER — Ambulatory Visit: Payer: 59

## 2016-08-21 DIAGNOSIS — I890 Lymphedema, not elsewhere classified: Secondary | ICD-10-CM | POA: Diagnosis not present

## 2016-08-21 DIAGNOSIS — M25522 Pain in left elbow: Secondary | ICD-10-CM

## 2016-08-21 NOTE — Therapy (Signed)
[place], [place], [address] Phone: [phone]   Fax:  [phone]  Physical Therapy Treatment  Patient Details  Name: Tara Bell MRN: [id number] Date of Birth: 03/01/1966 Referring Provider: Evlyn Clines, MD  Encounter Date: 08/21/2016      PT End of Session - 08/21/16 0936    Visit Number 2   Number of Visits 9   Date for PT Re-Evaluation 09/21/16   PT Start Time 0900  Pt running late   PT Stop Time 0932   PT Time Calculation (min) 32 min   Activity Tolerance Patient tolerated treatment well   Behavior During Therapy [facility] for tasks assessed/performed      Past Medical History:  Diagnosis Date  . BRCA1 negative 08/2011  . 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 Breast cancer ([place]) SEPTEMBER 2002   LEFT BREAST-SURGERY,CHEMO,RADIATION-DR.LIVESAY  . Hypertension   . 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 Lymphedema of arm    DUE TO LEFT   AXILLARY NODE DISSECTION   AND RADIATION  . Migraines   . S/P cardiac catheterization, with normal coronary arteries and normal EF, 07/01/13 07/13/2013    Past Surgical History:  Procedure Laterality Date  . AUGMENTATION MAMMAPLASTY  2008  . BILATERAL TUBAL    . BREAST LUMPECTOMY     AE 35  . CARDIAC CATHETERIZATION  07/01/13   Angiographically normal coronary arteries with normal left atrial function  . HYSTEROSCOPY  12/10/07   RESECTOSCOPIC POLYPECTOMY/MYOMECTOMY  . LEFT HEART CATHETERIZATION WITH CORONARY ANGIOGRAM N/A 07/01/2013   Procedure: LEFT HEART CATHETERIZATION WITH CORONARY ANGIOGRAM;  Surgeon: Leonie Man, MD;  Location: [facility] CATH LAB;  Service: Cardiovascular;  Laterality: N/A;  . MASTECTOMY, PARTIAL  07/23/2001   LEFT BREAST WITH AXILLARY NODE EVALUATION  . TUBAL LIGATION     BILATERAL    There were no vitals filed for this visit.      Subjective Assessment - 08/21/16 0903     Subjective My Lt arm isn't hurting quite as bad as yesterday.   Patient Stated Goals to get the pain in elbow to go away   Currently in Pain? Yes   Pain Score 4    Pain Location Elbow   Pain Orientation Left   Pain Descriptors / Indicators Other (Comment)  just when I move it   Pain Radiating Towards wrist   Pain Frequency Occasional   Aggravating Factors  today hurts just when I move it   Pain Relieving Factors not sure               LYMPHEDEMA/ONCOLOGY QUESTIONNAIRE - 08/20/16 0904      Lymphedema Assessments   Lymphedema Assessments Upper extremities     Right Upper Extremity Lymphedema   15 cm Proximal to Olecranon Process 30.7 cm   10 cm Proximal to Olecranon Process 30.2 cm   Olecranon Process 25.2 cm   15 cm Proximal to Ulnar Styloid Process 24.7 cm   10 cm Proximal to Ulnar Styloid Process 21.1 cm   Just Proximal to Ulnar Styloid Process 15.1 cm   Across Hand at [employer] 18 cm   At [place] of 2nd Digit 5.8 cm     Left Upper Extremity Lymphedema   15 cm Proximal to Olecranon Process 30.5 cm   10 cm Proximal to Olecranon Process 30.2 cm   Olecranon Process 26.4 cm   15  cm Proximal to Ulnar Styloid Process 24.5 cm   10 cm Proximal to Ulnar Styloid Process 21.7 cm   Just Proximal to Ulnar Styloid Process 17.2 cm   Across Hand at [employer] 18.3 cm   At [place] of 2nd Digit 5.6 cm                  [facility] Adult PT Treatment/Exercise - 08/21/16 0001      Manual Therapy   Soft tissue mobilization Gentle cross friction massage to Lt elbow at area of pain and instructed pt in this as well.    Manual Lymphatic Drainage (MLD) In Supine: Short neck, superficial and deep abdominals, Lt inguinal and Rt axillary nodes, Lt axillo-inguinal and anterior inter-axillary anastomosis, then Lt UE from dorsal hand to shoulder working proximal to distal then retracing all steps instructing pt in basic principles throughout.                         [facility] Goals - 08/20/16 [place] Term Goal  #1   Title Patient will report a 60% improvement in the pain in her left elbow for improved use of left UE for ADLs.   Time 4   Period Weeks   Status New     CC Long Term Goal  #2   Title Patient will be independent in self manual lymph drainage.   Time 4   Period Weeks   Status New     CC Long Term Goal  #3   Title Reduce circumference measurement just proximal to left ulnar styloid to 16.4 cm or less.   Baseline 17.2 on left compared to 15.1 on right at eval   Time 4   Period Weeks   Status New     CC Long Term Goal  #4   Title Reduce circumference measurement at left olecranon process to 25.9 cm or less.   Baseline 26.4 cm. on left compared to 25.2 on right at eval   Time 4   Period Weeks   Status New     CC Long Term Goal  #5   Title Patient will be knowledgeable about lymphedema risk reduction practices.   Time 4   Period Weeks   Status New     CC Long Term Goal  #6   Title Patient will obtain compression garment and be able to don/doff independently.   Time 4   Period Weeks   Status New     Additional Goals   Additional Goals Yes            Plan - 08/21/16 0936    Clinical Impression Statement Pt tolerated session well today reporting feeling looser after session. She verbalized good understanding of basic principles of manual lymph drainage by end of session. Unable to begin iontophoresis today as order has yet to be signed.   Rehab Potential Good   Clinical Impairments Affecting Rehab Potential previous axillary lymph node dissection and radiation   PT Frequency 2x / week   PT Duration 4 weeks   PT Treatment/Interventions ADLs/Self Care Home Management;Cryotherapy;Electrical Stimulation;Iontophoresis 41m/ml Dexamethasone;Therapeutic exercise;Therapeutic activities;DME Instruction;Patient/family education;Manual techniques;Manual lymph drainage;Compression bandaging;Scar mobilization;Passive  range of motion;Taping   PT Next Visit Plan Cont manual lymph drainage to left UE; cross-friction massage at origin of left wrist extensors/lateral left elbow; iontophoresis if approved by MD; check resisted left elbow extension for strength and pain.  Consulted and Agree with Plan of Care Patient      Patient will benefit from skilled therapeutic intervention in order to improve the following deficits and impairments:  Increased edema, Impaired UE functional use, Postural dysfunction, Pain  Visit Diagnosis: Lymphedema, not elsewhere classified  Pain in left elbow     Problem List Patient Active Problem List   Diagnosis Date Noted  . 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 Lymphedema of arm 07/28/2016  . Personal history of malignant neoplasm of breast 07/28/2016  . Genetic testing 07/19/2015  . Dense breast tissue 07/03/2015  . Elevated CA-125 07/03/2015  . Iron deficiency anemia due to chronic blood loss 07/03/2015  . Screening mammogram for high-risk patient 07/03/2015  . History of antineoplastic chemotherapy 10/31/2014  . Palpitations 04/19/2014  . Easy bruising 04/19/2014  . Fibroid uterus 01/26/2014  . Elevated troponin I level, mildly secondary to HTN 07/13/2013  . S/P cardiac catheterization, with normal coronary arteries and normal EF, 07/01/13 07/13/2013  . Hyperlipidemia LDL goal < 100, normal coronary arteries. 07/13/2013  . Chest pain 06/30/2013  . Fibroids, intramural 10/24/2012  . BRCA1 negative   . BRCA2 negative   . Hypertension   . Breast cancer ([place]) 07/06/2001    Otelia Limes, PTA 08/21/2016, 9:38 AM  [place], [place], [address] Phone: [phone]   Fax:  [phone]  Name: Tara Bell MRN: [id number] Date of Birth: 07-23-1966

## 2016-08-23 ENCOUNTER — Ambulatory Visit: Payer: 59

## 2016-08-28 ENCOUNTER — Ambulatory Visit: Payer: 59

## 2016-08-28 DIAGNOSIS — M25522 Pain in left elbow: Secondary | ICD-10-CM

## 2016-08-28 DIAGNOSIS — I890 Lymphedema, not elsewhere classified: Secondary | ICD-10-CM

## 2016-08-28 NOTE — Therapy (Signed)
[place], [place], [address] Phone: [phone]   Fax:  [phone]  Physical Therapy Treatment  Patient Details  Name: Tara Bell MRN: [id number] Date of Birth: 1966-06-25 Referring Provider: Evlyn Clines, MD  Encounter Date: 08/28/2016      PT End of Session - 08/28/16 1020    Visit Number 3   Number of Visits 9   Date for PT Re-Evaluation 09/21/16   PT Start Time 0935   PT Stop Time 1015   PT Time Calculation (min) 40 min   Activity Tolerance Patient tolerated treatment well   Behavior During Therapy [facility] for tasks assessed/performed      Past Medical History:  Diagnosis Date  . BRCA1 negative 08/2011  . 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 Breast cancer ([place]) SEPTEMBER 2002   LEFT BREAST-SURGERY,CHEMO,RADIATION-DR.LIVESAY  . Hypertension   . 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 Lymphedema of arm    DUE TO LEFT   AXILLARY NODE DISSECTION   AND RADIATION  . Migraines   . S/P cardiac catheterization, with normal coronary arteries and normal EF, 07/01/13 07/13/2013    Past Surgical History:  Procedure Laterality Date  . AUGMENTATION MAMMAPLASTY  2008  . BILATERAL TUBAL    . BREAST LUMPECTOMY     AE 35  . CARDIAC CATHETERIZATION  07/01/13   Angiographically normal coronary arteries with normal left atrial function  . HYSTEROSCOPY  12/10/07   RESECTOSCOPIC POLYPECTOMY/MYOMECTOMY  . LEFT HEART CATHETERIZATION WITH CORONARY ANGIOGRAM N/A 07/01/2013   Procedure: LEFT HEART CATHETERIZATION WITH CORONARY ANGIOGRAM;  Surgeon: Leonie Man, MD;  Location: [facility] CATH LAB;  Service: Cardiovascular;  Laterality: N/A;  . MASTECTOMY, PARTIAL  07/23/2001   LEFT BREAST WITH AXILLARY NODE EVALUATION  . TUBAL LIGATION     BILATERAL    There were no vitals filed for this visit.      Subjective Assessment - 08/28/16 0937    Subjective My Lt  arm felt good after last session. It hurt a little more over the weekend because [place] and just had it hanging at my side. Today it's feeling a little better.   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 She completed chemotherapy and radiation.   Patient Stated Goals to get the pain in elbow to go away   Currently in Pain? Yes   Pain Score 5    Pain Location Elbow   Pain Orientation Left   Pain Descriptors / Indicators Dull   Pain Type Acute pain   Pain Onset More than a month ago   Pain Frequency Occasional   Aggravating Factors  when I move it   Pain Relieving Factors not letting hang down at my side            [facility] PT Assessment - 08/28/16 0001      Strength   Overall Strength Comments Lt elbow strength 5/5 with some pain at max resistance when tested in full extension, also tested at 90 degrees elbow flexion with no pain here                     [facility] Adult PT Treatment/Exercise - 08/28/16 0001  Iontophoresis   Type of Iontophoresis Dexamethasone   Location Lt elbow lateral elbow inferior to olecranon process   Dose 42m-minutes (per packaging)   Time 6 hr wear     Manual Therapy   Soft tissue mobilization Gentle cross friction massage to Lt elbow at area of pain with myofascial release here   Manual Lymphatic Drainage (MLD) In Supine: Short neck, superficial and 5 diaphragmatic breaths (pt has palpable fibroid tumors), Lt inguinal and Rt axillary nodes, Lt axillo-inguinal and anterior inter-axillary anastomosis, then Lt UE from dorsal hand to shoulder working proximal to distal then retracing all steps instructing pt in basic principles throughout.                        [facility] [place] [facility]   #1   Title Patient will report a 60% improvement in the pain in her left elbow for improved use of left UE for ADLs.   Time 4   Period Weeks   Status New     CC Long Term Goal  #2   Title Patient will be independent in self manual lymph drainage.   Time 4   Period Weeks   Status New     CC Long Term Goal  #3   Title Reduce circumference measurement just proximal to left ulnar styloid to 16.4 cm or less.   Baseline 17.2 on left compared to 15.1 on right at eval   Time 4   Period Weeks   Status New     CC Long Term Goal  #4   Title Reduce circumference measurement at left olecranon process to 25.9 cm or less.   Baseline 26.4 cm. on left compared to 25.2 on right at eval   Time 4   Period Weeks   Status New     CC Long Term Goal  #5   Title Patient will be knowledgeable about lymphedema risk reduction practices.   Time 4   Period Weeks   Status New     CC Long Term Goal  #6   Title Patient will obtain compression garment and be able to don/doff independently.   Time 4   Period Weeks   Status New     Additional Goals   Additional Goals Yes            Plan - 08/28/16 1020    Clinical Impression Statement Pt reported having decrease pain after last session. Continued with same treatment but able to begin iontophoresis today as MD order has been signed for this. Pt verbalized good understanding of basic precautions and doffing time and to assess skin.    Rehab Potential Good   Clinical Impairments Affecting Rehab Potential previous axillary lymph node dissection and radiation   PT Frequency 2x / week   PT Duration 4 weeks   PT Treatment/Interventions ADLs/Self Care Home Management;Cryotherapy;Electrical Stimulation;Iontophoresis 475mml Dexamethasone;Therapeutic exercise;Therapeutic activities;DME Instruction;Patient/family education;Manual techniques;Manual lymph drainage;Compression bandaging;Scar mobilization;Passive range of motion;Taping   PT Next Visit Plan Assess  goals and how pt tolerated iontophoresis; cont manual lymph drainage and manual therapy to Lt elbow.   Consulted and Agree with Plan of Care Patient      Patient will benefit from skilled therapeutic intervention in order to improve the following deficits and impairments:  Increased edema, Impaired UE functional use, Postural dysfunction, Pain  Visit Diagnosis: Lymphedema, not elsewhere classified  Pain in left elbow  Problem List Patient Active Problem List   Diagnosis Date Noted  . 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 Lymphedema of arm 07/28/2016  . Personal history of malignant neoplasm of breast 07/28/2016  . Genetic testing 07/19/2015  . Dense breast tissue 07/03/2015  . Elevated CA-125 07/03/2015  . Iron deficiency anemia due to chronic blood loss 07/03/2015  . Screening mammogram for high-risk patient 07/03/2015  . History of antineoplastic chemotherapy 10/31/2014  . Palpitations 04/19/2014  . Easy bruising 04/19/2014  . Fibroid uterus 01/26/2014  . Elevated troponin I level, mildly secondary to HTN 07/13/2013  . S/P cardiac catheterization, with normal coronary arteries and normal EF, 07/01/13 07/13/2013  . Hyperlipidemia LDL goal < 100, normal coronary arteries. 07/13/2013  . Chest pain 06/30/2013  . Fibroids, intramural 10/24/2012  . BRCA1 negative   . BRCA2 negative   . Hypertension   . Breast cancer ([place]) 07/06/2001    Otelia Limes, PTA 08/28/2016, 10:23 AM  [place], [place], [address] Phone: [phone]   Fax:  [phone]  Name: TOMEKA KANTNER MRN: [id number] Date of Birth: 1965/12/28

## 2016-08-30 ENCOUNTER — Ambulatory Visit: Payer: 59

## 2016-08-30 DIAGNOSIS — I890 Lymphedema, not elsewhere classified: Secondary | ICD-10-CM

## 2016-08-30 DIAGNOSIS — M25522 Pain in left elbow: Secondary | ICD-10-CM

## 2016-08-30 NOTE — Patient Instructions (Signed)

## 2016-08-30 NOTE — Therapy (Signed)
[place], [place], [address] Phone: [phone]   Fax:  [phone]  Physical Therapy Treatment  Patient Details  Name: Tara Bell MRN: [id number] Date of Birth: Feb 20, 1966 Referring Provider: Evlyn Clines, MD  Encounter Date: 08/30/2016      PT End of Session - 08/30/16 0938    Visit Number 4   Number of Visits 9   Date for PT Re-Evaluation 09/21/16   PT Start Time 0851   PT Stop Time 0933   PT Time Calculation (min) 42 min   Activity Tolerance Patient tolerated treatment well   Behavior During Therapy [facility] for tasks assessed/performed      Past Medical History:  Diagnosis Date  . BRCA1 negative 08/2011  . 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 Breast cancer ([place]) SEPTEMBER 2002   LEFT BREAST-SURGERY,CHEMO,RADIATION-DR.LIVESAY  . Hypertension   . 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 Lymphedema of arm    DUE TO LEFT   AXILLARY NODE DISSECTION   AND RADIATION  . Migraines   . S/P cardiac catheterization, with normal coronary arteries and normal EF, 07/01/13 07/13/2013    Past Surgical History:  Procedure Laterality Date  . AUGMENTATION MAMMAPLASTY  2008  . BILATERAL TUBAL    . BREAST LUMPECTOMY     AE 35  . CARDIAC CATHETERIZATION  07/01/13   Angiographically normal coronary arteries with normal left atrial function  . HYSTEROSCOPY  12/10/07   RESECTOSCOPIC POLYPECTOMY/MYOMECTOMY  . LEFT HEART CATHETERIZATION WITH CORONARY ANGIOGRAM N/A 07/01/2013   Procedure: LEFT HEART CATHETERIZATION WITH CORONARY ANGIOGRAM;  Surgeon: Leonie Man, MD;  Location: [facility] CATH LAB;  Service: Cardiovascular;  Laterality: N/A;  . MASTECTOMY, PARTIAL  07/23/2001   LEFT BREAST WITH AXILLARY NODE EVALUATION  . TUBAL LIGATION     BILATERAL    There were no vitals filed for this visit.      Subjective Assessment - 08/30/16 0855    Subjective I  think the patch helped a little, the pain was some less. I had some 2 sharp pains about an hour after removing the patch but nothing lingered, didn't know what that was about. Not in any pain right now.   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 She completed chemotherapy and radiation.   Patient Stated Goals to get the pain in elbow to go away   Currently in Pain? No/denies               LYMPHEDEMA/ONCOLOGY QUESTIONNAIRE - 08/30/16 0858      Left Upper Extremity Lymphedema   15 cm Proximal to Olecranon Process 29.8 cm   10 cm Proximal to Olecranon Process 29.6 cm   Olecranon Process 25.6 cm   15 cm Proximal to Ulnar Styloid Process 24.6 cm   10 cm Proximal to Ulnar Styloid Process 21.8 cm   Just Proximal to Ulnar Styloid Process 16.3 cm   Across Hand at [employer] 17.6 cm   At [place] of 2nd Digit 5.4 cm                  [facility] Adult PT Treatment/Exercise - 08/30/16 0001      Iontophoresis   Type of Iontophoresis Dexamethasone   Location Lt elbow lateral elbow inferior to  olecranon process   Dose 28m-minutes (per packaging)   Time 6 hr wear     Manual Therapy   Soft tissue mobilization --   Manual Lymphatic Drainage (MLD) In Supine: Short neck, superficial and 5 diaphragmatic breaths (pt has palpable fibroid tumors), Lt inguinal and Rt axillary nodes, Lt axillo-inguinal and anterior inter-axillary anastomosis, then Lt UE from dorsal hand to shoulder working proximal to distal then retracing all steps instructing pt throughout.                PT Education - 08/30/16 1224    Education provided Yes   Education Details Instructed pt while performing today in self manual lymph drainage to Lt UE   Person(s) Educated Patient   Methods Explanation;Demonstration;Verbal cues;Handout;Tactile cues    Comprehension Verbalized understanding;Returned demonstration;Need further instruction                [facility] - 08/30/16 0857      CC Long Term Goal  #1   Title Patient will report a 60% improvement in the pain in her left elbow for improved use of left UE for ADLs.   Baseline 50% reported at this time 08/30/16   Status On-going     CC Long Term Goal  #2   Title Patient will be independent in self manual lymph drainage.   Status On-going     CC Long Term Goal  #3   Title Reduce circumference measurement just proximal to left ulnar styloid to 16.4 cm or less.   Baseline 17.2 on left compared to 15.1 on right at eval; 16.3 cm 08/30/16   Status Achieved     CC Long Term Goal  #4   Title Reduce circumference measurement at left olecranon process to 25.9 cm or less.   Baseline 26.4 cm. on left compared to 25.2 on right at eval; 25.6 cm 08/30/16   Status Achieved     CC Long Term Goal  #5   Title Patient will be knowledgeable about lymphedema risk reduction practices.   Status On-going     CC Long Term Goal  #6   Title Patient will obtain compression garment and be able to don/doff independently.   Status On-going            Plan - 08/30/16 04765   Clinical Impression Statement Pt did excellent with instruction of self manual lymph drainage today verbalizing good understanding of technique and returning good demonstration only requiring VCto increase amount of skin stretch, her pressure was good. Her circumference measurements have improved meeting both of those goals and she reports she had noticed some reduction of pain with initial iontophoresis patch and had no pain this morning so continued with this today as well. Pt is overall making good progress towards her goals!   Rehab Potential Good   Clinical Impairments Affecting Rehab Potential previous axillary lymph node dissection and radiation   PT Frequency 2x / week   PT Duration 4 weeks   PT  Treatment/Interventions ADLs/Self Care Home Management;Cryotherapy;Electrical Stimulation;Iontophoresis 415mml Dexamethasone;Therapeutic exercise;Therapeutic activities;DME Instruction;Patient/family education;Manual techniques;Manual lymph drainage;Compression bandaging;Scar mobilization;Passive range of motion;Taping   PT Next Visit Plan Cont iontophoresis; cont manual lymph drainage and review with pt; cont manual therapy to Lt elbow.   Consulted and Agree with Plan of Care Patient      Patient will benefit from skilled therapeutic intervention in order to improve the following deficits and impairments:  Increased edema, Impaired UE functional use, Postural dysfunction,  Pain  Visit Diagnosis: Lymphedema, not elsewhere classified  Pain in left elbow     Problem List Patient Active Problem List   Diagnosis Date Noted  . 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 Lymphedema of arm 07/28/2016  . Personal history of malignant neoplasm of breast 07/28/2016  . Genetic testing 07/19/2015  . Dense breast tissue 07/03/2015  . Elevated CA-125 07/03/2015  . Iron deficiency anemia due to chronic blood loss 07/03/2015  . Screening mammogram for high-risk patient 07/03/2015  . History of antineoplastic chemotherapy 10/31/2014  . Palpitations 04/19/2014  . Easy bruising 04/19/2014  . Fibroid uterus 01/26/2014  . Elevated troponin I level, mildly secondary to HTN 07/13/2013  . S/P cardiac catheterization, with normal coronary arteries and normal EF, 07/01/13 07/13/2013  . Hyperlipidemia LDL goal < 100, normal coronary arteries. 07/13/2013  . Chest pain 06/30/2013  . Fibroids, intramural 10/24/2012  . BRCA1 negative   . BRCA2 negative   . Hypertension   . Breast cancer ([place]) 07/06/2001    Otelia Limes, PTA 08/30/2016, 12:29 PM  [place], [place], [address] Phone: [phone]   Fax:   [phone]  Name: NYLEAH MCGINNIS MRN: [id number] Date of Birth: 07/30/1966

## 2016-08-31 ENCOUNTER — Telehealth: Payer: Self-pay

## 2016-08-31 NOTE — Telephone Encounter (Signed)
Patient said she spoke with Dr. Moshe Salisbury back in Feb about TAH,BSO.  She has met her deductible and wanted to see if she could schedule for last week in December. I explained to her that our surgery schedules are full until January.  I am happy to schedule her for the first week in Jan. Just do not have sooner.   She would like me to call her if I get a cancellation before end of year. Otherwise she will call me after first of year when she is ready to schedule.

## 2016-09-02 IMAGING — MR MR BREAST BILATERAL W WO CONTRAST
6 of 13 series · 23 of 48 positions shown · IV contrast (13ml multihance)
Comparison: Previous exam(s).

CLINICAL DATA: History of left breast cancer status post lumpectomy
in 8448. BRCA 2 positive.

LABS:  None obtained at the time of imaging.
EXAM:
BILATERAL BREAST MRI WITH AND WITHOUT CONTRAST
TECHNIQUE: Multiplanar, multisequence MR images of both breasts were obtained
prior to and following the intravenous administration of 13 ml of
MultiHance.

[Series 2: T2 · axial · 3.0mm · 0.47mm/px · z∈[-63,+99]mm · 3 of 55 slices shown]
[im 1/55]
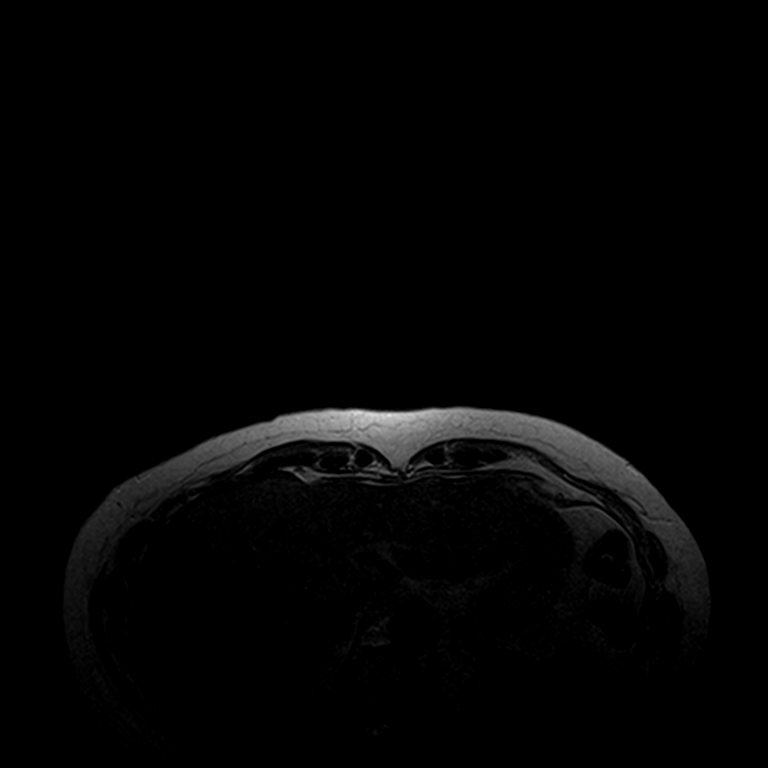
[im 28/55]
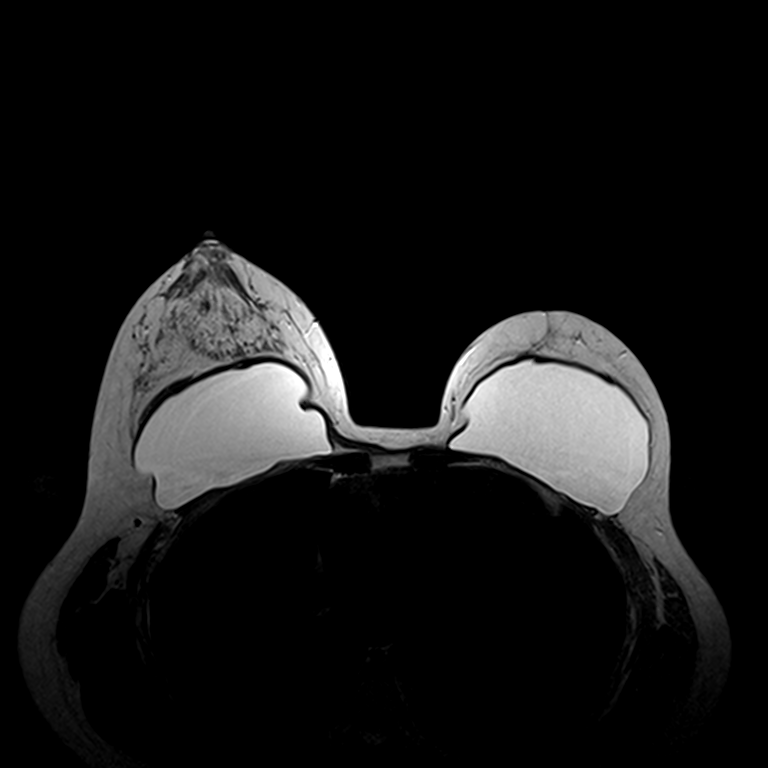
[im 55/55]
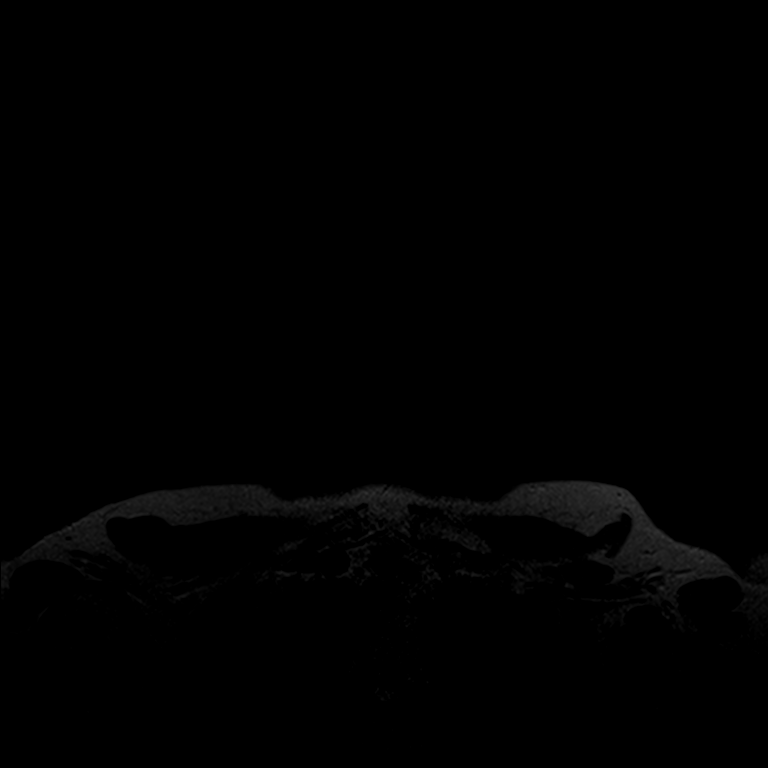

[Series 3: t2_tirm_tra ipat (a-p) · axial · 3.0mm · 0.70mm/px · z∈[-63,+99]mm · 2 of 55 slices shown]
[im 1/55]
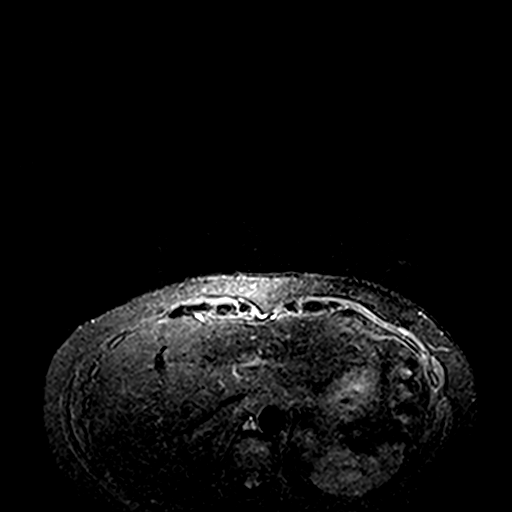
[im 55/55]
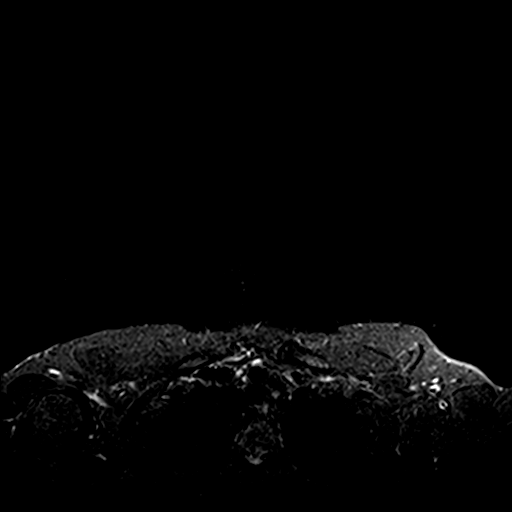

[Series 4: fl3d pre-cm no · axial · non-contrast · 1.2mm · 0.94mm/px · z∈[-67,+104]mm · 5 of 144 slices shown]
[im 1/144]
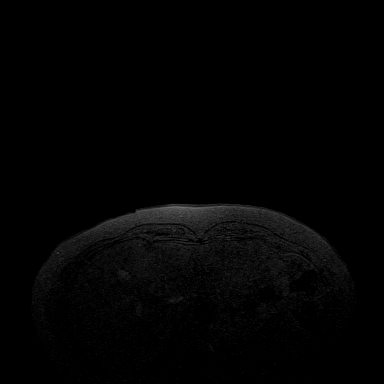
[im 36/144]
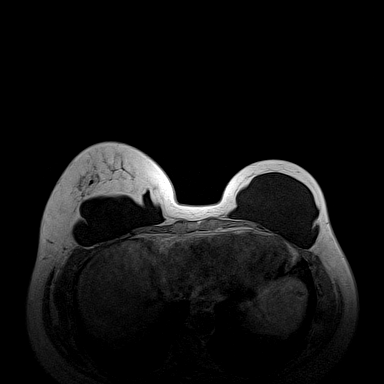
[im 72/144]
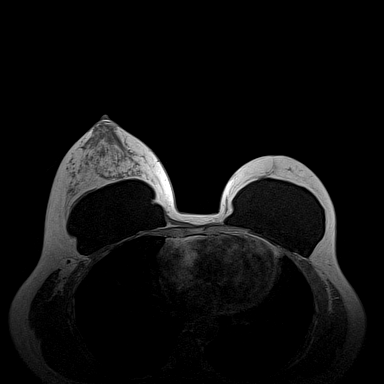
[im 108/144]
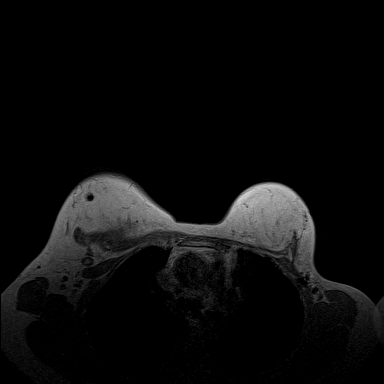
[im 144/144]
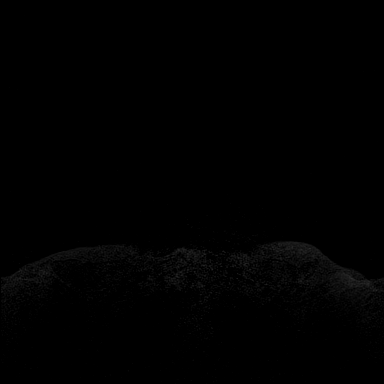

[Series 5: fl3d pre-cm · axial · non-contrast · 1.2mm · 0.94mm/px · z∈[-67,+104]mm · 5 of 144 slices shown]
[im 1/144]
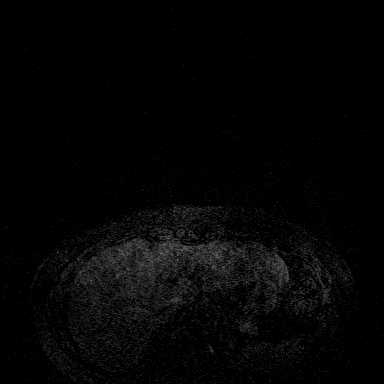
[im 36/144]
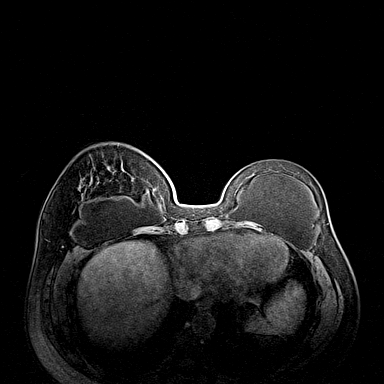
[im 72/144]
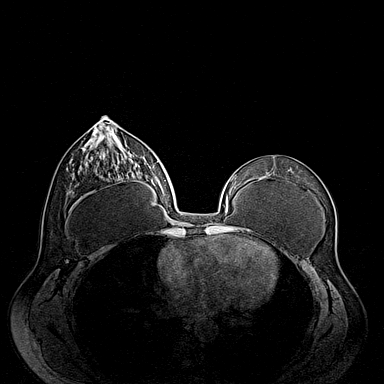
[im 108/144]
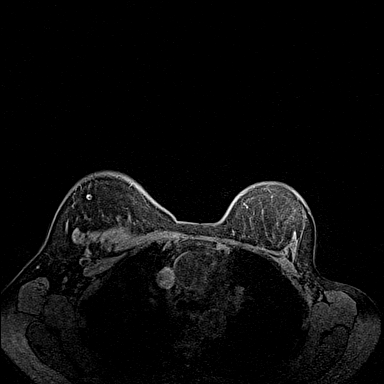
[im 144/144]
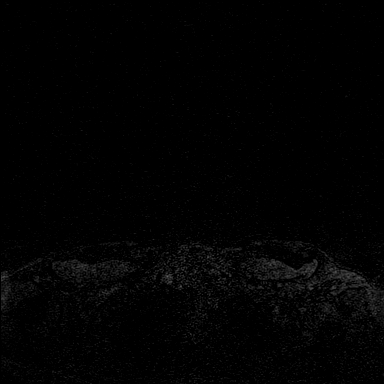

[Series 6: fl3d post immediate · axial · 1.2mm · 0.94mm/px · z∈[-67,+104]mm · 5 of 144 slices shown (1 of 2)]
[im 1/144]
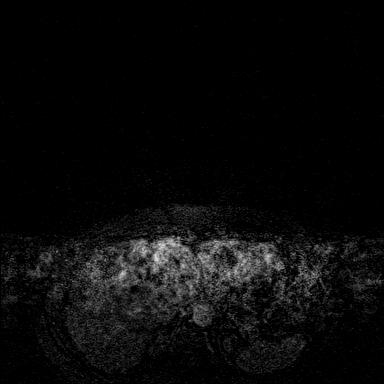
[im 36/144]
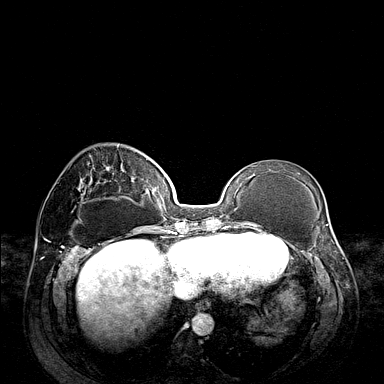
[im 72/144]
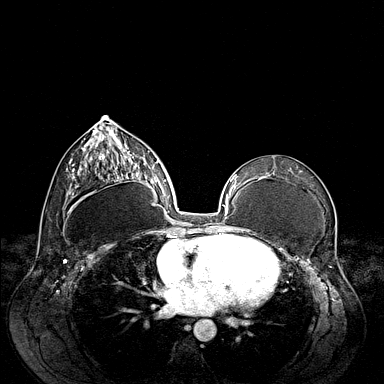
[im 108/144]
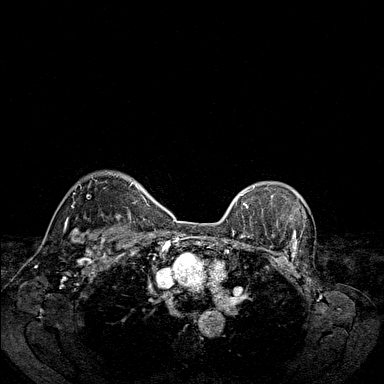
[im 144/144]
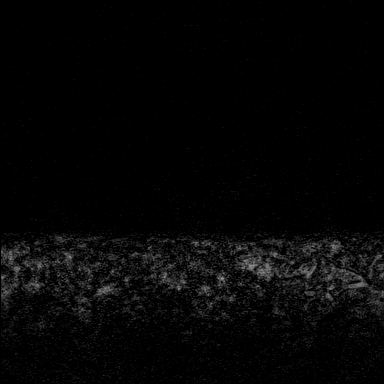

[Series 7: fl3d post immediate · axial · 1.2mm · 0.94mm/px · z∈[-67,+18]mm · 3 of 144 slices shown (2 of 2)]
[im 1/144]
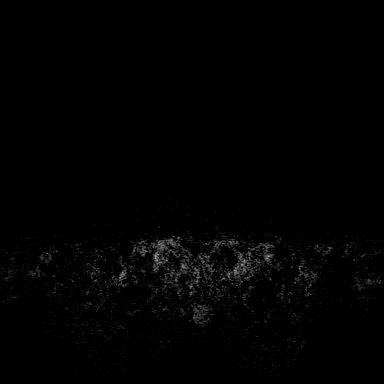
[im 36/144]
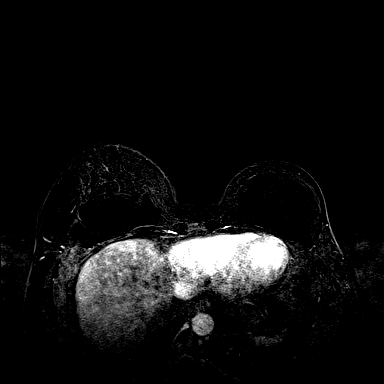
[im 72/144]
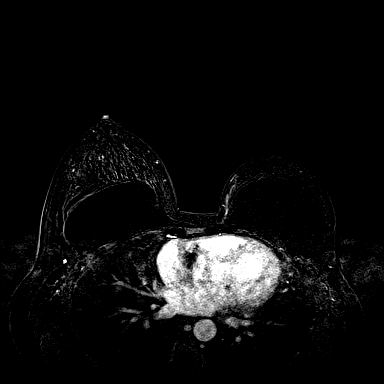

[23 of 48 positions shown; findings below may reference images not displayed]

THREE-DIMENSIONAL MR IMAGE RENDERING ON INDEPENDENT WORKSTATION:

Three-dimensional MR images were rendered by post-processing of the
original MR data on an independent workstation. The
three-dimensional MR images were interpreted, and findings are
reported in the following complete MRI report for this study. Three
dimensional images were evaluated at the independent [employer]
workstation
Prior MRIs dated 11/18/2009,
11/12/2011, 12/16/2012 and 01/08/2014. Prior mammograms dating back
to 07/14/2007.
FINDINGS: Breast composition: c. Heterogeneous fibroglandular tissue.

Background parenchymal enhancement: Moderate.

The patient has bilateral retropectoral implants.

Right breast: In the upper-outer quadrant of the right breast there
is a well-circumscribed 1.6 cm mass with a signal void artifact
corresponding with a biopsy clip. The lesion was biopsied in 0575
and proven to be a fibroadenoma. The lesion has increased in size.
On the 0511 MRI I measure the lesion at 1.2 cm.

Left breast: No mass or abnormal enhancement.

Lymph nodes: No abnormal appearing lymph nodes.

Ancillary findings: 2.6 cm T2 bright mass in the lateral left
hepatic lobe without definite enhancement is again visualized. It is
favored to be a benign cyst.
IMPRESSION: 1. Enlarging mass in the upper-outer quadrant of the right breast.
This was previously biopsied in 0575 and was found to be a
fibroadenoma.

2.  Probable benign cyst in the lateral left hepatic lobe.

RECOMMENDATION:
Sonographic evaluation of the previously biopsied fibroadenoma in
the upper-outer quadrant of the right breast is recommended. If the
lesion is enlarging I would consider rebiopsy or surgical excision.

BI-RADS CATEGORY  0: Incomplete. Need additional imaging evaluation
and/or prior mammograms for comparison.

## 2016-09-04 ENCOUNTER — Encounter: Payer: 59 | Admitting: Physical Therapy

## 2016-09-06 ENCOUNTER — Encounter: Payer: 59 | Admitting: Physical Therapy

## 2016-09-11 ENCOUNTER — Ambulatory Visit: Payer: 59 | Attending: Oncology

## 2016-09-11 DIAGNOSIS — I890 Lymphedema, not elsewhere classified: Secondary | ICD-10-CM | POA: Insufficient documentation

## 2016-09-11 DIAGNOSIS — M25522 Pain in left elbow: Secondary | ICD-10-CM | POA: Diagnosis present

## 2016-09-11 NOTE — Therapy (Addendum)
[place], [place], [address] Phone: [phone]   Fax:  [phone]  Physical Therapy Treatment  Patient Details  Name: Tara Bell MRN: [id number] Date of Birth: 15-Nov-1965 Referring Provider: Evlyn Clines, MD  Encounter Date: 09/11/2016      PT End of Session - 09/11/16 1029    Visit Number 5   Number of Visits 9   Date for PT Re-Evaluation 09/21/16   PT Start Time 0936   PT Stop Time 1019   PT Time Calculation (min) 43 min   Activity Tolerance Patient tolerated treatment well   Behavior During Therapy [facility] for tasks assessed/performed      Past Medical History:  Diagnosis Date  . BRCA1 negative 08/2011  . 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 Breast cancer ([place]) SEPTEMBER 2002   LEFT BREAST-SURGERY,CHEMO,RADIATION-DR.LIVESAY  . Hypertension   . 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 Lymphedema of arm    DUE TO LEFT   AXILLARY NODE DISSECTION   AND RADIATION  . Migraines   . S/P cardiac catheterization, with normal coronary arteries and normal EF, 07/01/13 07/13/2013    Past Surgical History:  Procedure Laterality Date  . AUGMENTATION MAMMAPLASTY  2008  . BILATERAL TUBAL    . BREAST LUMPECTOMY     AE 35  . CARDIAC CATHETERIZATION  07/01/13   Angiographically normal coronary arteries with normal left atrial function  . HYSTEROSCOPY  12/10/07   RESECTOSCOPIC POLYPECTOMY/MYOMECTOMY  . LEFT HEART CATHETERIZATION WITH CORONARY ANGIOGRAM N/A 07/01/2013   Procedure: LEFT HEART CATHETERIZATION WITH CORONARY ANGIOGRAM;  Surgeon: Leonie Man, MD;  Location: [facility] CATH LAB;  Service: Cardiovascular;  Laterality: N/A;  . MASTECTOMY, PARTIAL  07/23/2001   LEFT BREAST WITH AXILLARY NODE EVALUATION  . TUBAL LIGATION     BILATERAL    There were no vitals filed for this visit.      Subjective Assessment - 09/11/16 0938    Subjective My Lt  elbow is feeling better but the patch really messed with my skin. It felt fine when it was on but when I took off my skin was really irritated and still is (last appt was 10/26). But I am really sensitive to adhesive like bandaids so I wasn't surprised. My elbow only hurts now when I keep it bent too long, and even wakes me up if I end up in this position. Waking up like this about 3x/week. My pain is about 99% better overall, don't even feel it every day now, my biggest complaint is some weakness like when I pick up my laptop bag. Had some hand swelling over weekend after cleaning alot but did the self MLD and it helped.   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 She completed chemotherapy and radiation.   Patient Stated Goals to get the pain in elbow to go away   Currently in Pain? No/denies                         [facility] Adult PT Treatment/Exercise - 09/11/16 0001      Elbow Exercises   Wrist Flexion Strengthening;Left;10 reps;Supine;Bar weights/barbell   Bar Weights/Barbell (Wrist Flexion) 2 lbs     Wrist Exercises   Bar Weights/Barbell (Forearm  Supination) 2 lbs  Lt UE 10 times   Wrist Radial Deviation Strengthening;Left;10 reps;Supine;Bar weights/barbell   Bar Weights/Barbell (Radial Deviation) 2 lbs     Manual Therapy   Manual Lymphatic Drainage (MLD) In Supine: Short neck, 5 diaphragmatic breaths, Lt inguinal and Rt axillary nodes, Lt axillo-inguinal and anterior inter-axillary anastomosis, then Lt UE from dorsal hand to shoulder working proximal to distal then retracing all steps having pt perform initially up to forearm and as she was doing an excellent job with technique therapist completed from there.                PT Education - 09/11/16 1029    Education provided Yes   Education Details Lt  elbow/forearm strength; also reviewed self MLD with pt today with hand over hand technique to instruct to relax fingers/grip   Person(s) Educated Patient   Methods Explanation;Demonstration;Handout   Comprehension Verbalized understanding;Returned demonstration                [place] - 09/11/16 0945      CC Long Term Goal  #1   Title Patient will report a 60% improvement in the pain in her left elbow for improved use of left UE for ADLs.   Baseline 50% reported at this time 08/30/16; 99% improvement reported 09/11/16   Status Achieved     CC Long Term Goal  #2   Title Patient will be independent in self manual lymph drainage.   Baseline Been working on this since last visit and has some questions, so reviewed today-09/11/16   Status Partially Met            Plan - 09/11/16 1030    Clinical Impression Statement Held off on iontophoresis today as pts skin is still irritated and healing from last application. Pt continued to do excellent with review of self manual lymph draiange really only needing monor VC to relax her fingers/grip throughout but she was able to return correct demo after a few trials. Also issued information for her on where she can go to get measured for a compression sleeve/gauntlet at either a [facility] or [facility] and she verbalized understanding.    Rehab Potential Good   Clinical Impairments Affecting Rehab Potential previous axillary lymph node dissection and radiation   PT Frequency 2x / week   PT Duration 4 weeks   PT Treatment/Interventions ADLs/Self Care Home Management;Cryotherapy;Electrical Stimulation;Iontophoresis 69m/ml Dexamethasone;Therapeutic exercise;Therapeutic activities;DME Instruction;Patient/family education;Manual techniques;Manual lymph drainage;Compression bandaging;Scar mobilization;Passive range of motion;Taping   PT Next Visit Plan Remeasure circumference for goal assess. Hold iontophoresis as pts skin  reacted to adhesive; cont manual lymph drainage and review with pt prn and review HEP, progress if needed.   Consulted and Agree with Plan of Care Patient      Patient will benefit from skilled therapeutic intervention in order to improve the following deficits and impairments:  Increased edema, Impaired UE functional use, Postural dysfunction, Pain  Visit Diagnosis: Lymphedema, not elsewhere classified  Pain in left elbow     Problem List Patient Active Problem List   Diagnosis Date Noted  . 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 Lymphedema of arm 07/28/2016  . Personal history of malignant neoplasm of breast 07/28/2016  . Genetic testing 07/19/2015  . Dense breast tissue 07/03/2015  . Elevated CA-125 07/03/2015  . Iron deficiency anemia due to chronic blood loss 07/03/2015  . Screening mammogram for high-risk patient 07/03/2015  . History of antineoplastic chemotherapy 10/31/2014  .  Palpitations 04/19/2014  . Easy bruising 04/19/2014  . Fibroid uterus 01/26/2014  . Elevated troponin I level, mildly secondary to HTN 07/13/2013  . S/P cardiac catheterization, with normal coronary arteries and normal EF, 07/01/13 07/13/2013  . Hyperlipidemia LDL goal < 100, normal coronary arteries. 07/13/2013  . Chest pain 06/30/2013  . Fibroids, intramural 10/24/2012  . BRCA1 negative   . BRCA2 negative   . Hypertension   . Breast cancer ([place]) 07/06/2001    Otelia Limes, PTA 09/11/2016, 10:38 AM  [place], [place], [address] Phone: [phone]   Fax:  [phone]  Name: Tara Bell MRN: [id number] Date of Birth: April 07, 1966  PHYSICAL THERAPY DISCHARGE SUMMARY  Visits from Start of Care: 5  Current functional level related to goals / functional outcomes: Goas partially met as noted above.   Remaining deficits: Unknown, as patient did not complete her course of therapy.    Education / Equipment: HEP Plan: Patient agrees to discharge.  Patient goals were partially met. Patient is being discharged due to not returning since the last visit.  ?????    Serafina Royals, PT 12/26/17 2:08 PM

## 2016-09-11 NOTE — Patient Instructions (Signed)
Cancer Rehab [id number]  Forearm Pronation / Supination: Resisted (Sitting)    With right forearm supported, grasp object and gently rotate palm up, then down, as far as possible without pain. Repeat __10__ times per set. Do _1-2___ sets per session. Do __2__ sessions per day.  Copyright  VHI. All rights reserved.   AROM: Elbow Flexion / Extension    With left hand palm up, gently bend elbow as far as possible. Then straighten arm as far as possible. Repeat _10___ times per set. Do _1-2___ sets per session. Do _2___ sessions per day. Holding 2 lbs or hammer.  Copyright  VHI. All rights reserved.   AROM: Wrist Radial Deviation    With right thumb up, bend wrist up. Repeat __10__ times per set. Do _1-2___ sets per session. Do __2__ sessions per day. Holding 2 lbs hammer.   Copyright  VHI. All rights reserved.

## 2016-09-13 ENCOUNTER — Ambulatory Visit: Payer: 59

## 2016-10-05 ENCOUNTER — Other Ambulatory Visit: Payer: Self-pay | Admitting: Women's Health

## 2016-10-18 ENCOUNTER — Telehealth: Payer: Self-pay | Admitting: *Deleted

## 2016-10-18 DIAGNOSIS — D500 Iron deficiency anemia secondary to blood loss (chronic): Secondary | ICD-10-CM

## 2016-10-18 MED ORDER — FERROUS FUMARATE 325 (106 FE) MG PO TABS: ORAL_TABLET | ORAL | 3 refills | Status: DC

## 2016-10-18 NOTE — Telephone Encounter (Signed)
Told Ms Peris that the Ferrous fumarate was sent in to her pharmacy.  Please have the pharmacy call if any questions or problems. Pt periods have been heavier the last few months so Dr. Marko Plume will resume oral iron and check labs on 11-20-16.  Pt given appointment for 11-20-16 at 1030 am. Labs are cbc/diff, serum iron,TIBC, and ferritin.

## 2016-10-18 NOTE — Telephone Encounter (Signed)
Patient called requesting providers nurse for medication refill/management.  Call transferred ext [phone] per patient's request.

## 2016-11-06 ENCOUNTER — Telehealth: Payer: Self-pay

## 2016-11-06 DIAGNOSIS — D500 Iron deficiency anemia secondary to blood loss (chronic): Secondary | ICD-10-CM

## 2016-11-06 NOTE — Telephone Encounter (Signed)
Pt called stating [facility] has had difficulty getting iron in stock. They should get it Jan 4 and she will start her iron then. She has been having heavy periods Nov and Dec. She has not had a hysterectomy at this time.  Pt was asking if she should keep her lab appt on 1/16 because of the trouble getting her rx filled. I instructed pt to keep lab appt unless otherwise instructed by Dr Marko Plume.  I did inform pt that Dr Marko Plume will be retiring but we have a doctor taking over her patients.

## 2016-11-07 ENCOUNTER — Ambulatory Visit (INDEPENDENT_AMBULATORY_CARE_PROVIDER_SITE_OTHER): Payer: 59 | Admitting: Women's Health

## 2016-11-07 ENCOUNTER — Encounter: Payer: Self-pay | Admitting: Women's Health

## 2016-11-07 VITALS — BP 124/82 | Ht 60.0 in | Wt 149.0 lb

## 2016-11-07 DIAGNOSIS — D259 Leiomyoma of uterus, unspecified: Secondary | ICD-10-CM | POA: Diagnosis not present

## 2016-11-07 DIAGNOSIS — Z01419 Encounter for gynecological examination (general) (routine) without abnormal findings: Secondary | ICD-10-CM

## 2016-11-07 MED ORDER — IBUPROFEN 600 MG PO TABS: 600.0000 mg | ORAL_TABLET | Freq: Three times a day (TID) | ORAL | 3 refills | Status: DC | PRN

## 2016-11-07 MED ORDER — VALACYCLOVIR HCL 500 MG PO TABS: 500.0000 mg | ORAL_TABLET | Freq: Two times a day (BID) | ORAL | 12 refills | Status: DC | PRN

## 2016-11-07 NOTE — Patient Instructions (Addendum)
Colonoscopy  Dr Celine Ahr GI Abdominal Hysterectomy, Care After Refer to this sheet in the next few weeks. These instructions provide you with information on caring for yourself after your procedure. Your health care provider may also give you more specific instructions. Your treatment has been planned according to current medical practices, but problems sometimes occur. Call your health care provider if you have any problems or questions after your procedure. What can I expect after the procedure? After your procedure, it is typical to have the following:  Pain.  Feeling tired.  Poor appetite.  Less interest in sex. It takes 4-6 weeks to recover from this surgery. Follow these instructions at home:  Take pain medicines only as directed by your health care provider. Do not take over-the-counter pain medicines without checking with your health care provider first.  Change your bandage as directed by your health care provider.  Return to your health care provider to have your sutures taken out.  Take showers instead of baths for 2-3 weeks. Ask your health care provider when it is safe to start showering.  Do not douche, use tampons, or have sexual intercourse for at least 6 weeks or until your health care provider says you can.  Follow your health care provider's advice about exercise, lifting, driving, and general activities.  Get plenty of rest and sleep.  Do not lift anything heavier than a gallon of milk (about 10 lb [4.5 kg]) for the first month after surgery.  You can resume your normal diet if your health care provider says it is okay.  Do not drink alcohol until your health care provider says you can.  If you are constipated, ask your health care provider if you can take a mild laxative.  Eating foods high in fiber may also help with constipation. Eat plenty of raw fruits and vegetables, whole grains, and beans.  Drink enough fluids to keep your urine clear or  pale yellow.  Try to have someone at home with you for the first 1-2 weeks to help around the house.  Keep all follow-up appointments. Contact a health care provider if:  You have chills or fever.  You have swelling, redness, or pain in the area of your incision that is getting worse.  You have pus coming from the incision.  You notice a bad smell coming from the incision or bandage.  Your incision breaks open.  You feel dizzy or light-headed.  You have pain or bleeding when you urinate.  You have persistent diarrhea.  You have persistent nausea and vomiting.  You have abnormal vaginal discharge.  You have a rash.  You have any type of abnormal reaction or develop an allergy to your medicine.  Your pain medicine is not helping. Get help right away if:  You have a fever and your symptoms suddenly get worse.  You have severe abdominal pain.  You have chest pain.  You have shortness of breath.  You faint.  You have pain, swelling, or redness of your leg.  You have heavy vaginal bleeding with blood clots. This information is not intended to replace advice given to you by your health care provider. Make sure you discuss any questions you have with your health care provider. Document Released: 05/11/2005 Document Revised: 04/04/2016 Document Reviewed: 08/14/2013 Elsevier Interactive Patient Education  2017 [place] Maintenance, Female Introduction Adopting a healthy lifestyle and getting preventive care can go a long way to promote health and wellness. Talk with your health  care provider about what schedule of regular examinations is right for you. This is a good chance for you to check in with your provider about disease prevention and staying healthy. In between checkups, there are plenty of things you can do on your own. Experts have done a lot of research about which lifestyle changes and preventive measures are most likely to keep you healthy. Ask your  health care provider for more information. Weight and diet Eat a healthy diet  Be sure to include plenty of vegetables, fruits, low-fat dairy products, and lean protein.  Do not eat a lot of foods high in solid fats, added sugars, or salt.  Get regular exercise. This is one of the most important things you can do for your health.  Most adults should exercise for at least 150 minutes each week. The exercise should increase your heart rate and make you sweat (moderate-intensity exercise).  Most adults should also do strengthening exercises at least twice a week. This is in addition to the moderate-intensity exercise. Maintain a healthy weight  Body mass index (BMI) is a measurement that can be used to identify possible weight problems. It estimates body fat based on height and weight. Your health care provider can help determine your BMI and help you achieve or maintain a healthy weight.  For females 29 years of age and older:  A BMI below 18.5 is considered underweight.  A BMI of 18.5 to 24.9 is normal.  A BMI of 25 to 29.9 is considered overweight.  A BMI of 30 and above is considered obese. Watch levels of cholesterol and blood lipids  You should start having your blood tested for lipids and cholesterol at 50 years of age, then have this test every 5 years.  You may need to have your cholesterol levels checked more often if:  Your lipid or cholesterol levels are high.  You are older than 50 years of age.  You are at high risk for heart disease. Cancer screening Lung Cancer  Lung cancer screening is recommended for adults 71-46 years old who are at high risk for lung cancer because of a history of smoking.  A yearly low-dose CT scan of the lungs is recommended for people who:  Currently smoke.  Have quit within the past 15 years.  Have at least a 30-pack-year history of smoking. A pack year is smoking an average of one pack of cigarettes a day for 1 year.  Yearly  screening should continue until it has been 15 years since you quit.  Yearly screening should stop if you develop a health problem that would prevent you from having lung cancer treatment. Breast Cancer  Practice breast self-awareness. This means understanding how your breasts normally appear and feel.  It also means doing regular breast self-exams. Let your health care provider know about any changes, no matter how small.  If you are in your 20s or 30s, you should have a clinical breast exam (CBE) by a health care provider every 1-3 years as part of a regular health exam.  If you are 12 or older, have a CBE every year. Also consider having a breast X-ray (mammogram) every year.  If you have a family history of breast cancer, talk to your health care provider about genetic screening.  If you are at high risk for breast cancer, talk to your health care provider about having an MRI and a mammogram every year.  Breast cancer gene (BRCA) assessment is recommended for women  who have family members with BRCA-related cancers. BRCA-related cancers include:  Breast.  Ovarian.  Tubal.  Peritoneal cancers.  Results of the assessment will determine the need for genetic counseling and BRCA1 and BRCA2 testing. Cervical Cancer  Your health care provider may recommend that you be screened regularly for cancer of the pelvic organs (ovaries, uterus, and vagina). This screening involves a pelvic examination, including checking for microscopic changes to the surface of your cervix (Pap test). You may be encouraged to have this screening done every 3 years, beginning at age 19.  For women ages 28-65, health care providers may recommend pelvic exams and Pap testing every 3 years, or they may recommend the Pap and pelvic exam, combined with testing for human papilloma virus (HPV), every 5 years. Some types of HPV increase your risk of cervical cancer. Testing for HPV may also be done on women of any age with  unclear Pap test results.  Other health care providers may not recommend any screening for nonpregnant women who are considered low risk for pelvic cancer and who do not have symptoms. Ask your health care provider if a screening pelvic exam is right for you.  If you have had past treatment for cervical cancer or a condition that could lead to cancer, you need Pap tests and screening for cancer for at least 20 years after your treatment. If Pap tests have been discontinued, your risk factors (such as having a new sexual partner) need to be reassessed to determine if screening should resume. Some women have medical problems that increase the chance of getting cervical cancer. In these cases, your health care provider may recommend more frequent screening and Pap tests. Colorectal Cancer  This type of cancer can be detected and often prevented.  Routine colorectal cancer screening usually begins at 50 years of age and continues through 50 years of age.  Your health care provider may recommend screening at an earlier age if you have risk factors for colon cancer.  Your health care provider may also recommend using home test kits to check for hidden blood in the stool.  A small camera at the end of a tube can be used to examine your colon directly (sigmoidoscopy or colonoscopy). This is done to check for the earliest forms of colorectal cancer.  Routine screening usually begins at age 3.  Direct examination of the colon should be repeated every 5-10 years through 50 years of age. However, you may need to be screened more often if early forms of precancerous polyps or small growths are found. Skin Cancer  Check your skin from head to toe regularly.  Tell your health care provider about any new moles or changes in moles, especially if there is a change in a mole's shape or color.  Also tell your health care provider if you have a mole that is larger than the size of a pencil eraser.  Always  use sunscreen. Apply sunscreen liberally and repeatedly throughout the day.  Protect yourself by wearing long sleeves, pants, a wide-brimmed hat, and sunglasses whenever you are outside. Heart disease, diabetes, and high blood pressure  High blood pressure causes heart disease and increases the risk of stroke. High blood pressure is more likely to develop in:  People who have blood pressure in the high end of the normal range (130-139/85-89 mm Hg).  People who are overweight or obese.  People who are African American.  If you are 51-59 years of age, have your blood pressure  checked every 3-5 years. If you are 70 years of age or older, have your blood pressure checked every year. You should have your blood pressure measured twice-once when you are at a hospital or clinic, and once when you are not at a hospital or clinic. Record the average of the two measurements. To check your blood pressure when you are not at a hospital or clinic, you can use:  An automated blood pressure machine at a pharmacy.  A home blood pressure monitor.  If you are between 1 years and 53 years old, ask your health care provider if you should take aspirin to prevent strokes.  Have regular diabetes screenings. This involves taking a blood sample to check your fasting blood sugar level.  If you are at a normal weight and have a low risk for diabetes, have this test once every three years after 50 years of age.  If you are overweight and have a high risk for diabetes, consider being tested at a younger age or more often. Preventing infection Hepatitis B  If you have a higher risk for hepatitis B, you should be screened for this virus. You are considered at high risk for hepatitis B if:  You were born in a country where hepatitis B is common. Ask your health care provider which countries are considered high risk.  Your parents were born in a high-risk country, and you have not been immunized against hepatitis B  (hepatitis B vaccine).  You have HIV or AIDS.  You use needles to inject street drugs.  You live with someone who has hepatitis B.  You have had sex with someone who has hepatitis B.  You get hemodialysis treatment.  You take certain medicines for conditions, including cancer, organ transplantation, and autoimmune conditions. Hepatitis C  Blood testing is recommended for:  Everyone born from [birth year] through 1964.  Anyone with known risk factors for hepatitis C. Sexually transmitted infections (STIs)  You should be screened for sexually transmitted infections (STIs) including gonorrhea and chlamydia if:  You are sexually active and are younger than 50 years of age.  You are older than 50 years of age and your health care provider tells you that you are at risk for this type of infection.  Your sexual activity has changed since you were last screened and you are at an increased risk for chlamydia or gonorrhea. Ask your health care provider if you are at risk.  If you do not have HIV, but are at risk, it may be recommended that you take a prescription medicine daily to prevent HIV infection. This is called pre-exposure prophylaxis (PrEP). You are considered at risk if:  You are sexually active and do not regularly use condoms or know the HIV status of your partner(s).  You take drugs by injection.  You are sexually active with a partner who has HIV. Talk with your health care provider about whether you are at high risk of being infected with HIV. If you choose to begin PrEP, you should first be tested for HIV. You should then be tested every 3 months for as long as you are taking PrEP. Pregnancy  If you are premenopausal and you may become pregnant, ask your health care provider about preconception counseling.  If you may become pregnant, take 400 to 800 micrograms (mcg) of folic acid every day.  If you want to prevent pregnancy, talk to your health care provider about birth  control (contraception). Osteoporosis and menopause  Osteoporosis  is a disease in which the bones lose minerals and strength with aging. This can result in serious bone fractures. Your risk for osteoporosis can be identified using a bone density scan.  If you are 57 years of age or older, or if you are at risk for osteoporosis and fractures, ask your health care provider if you should be screened.  Ask your health care provider whether you should take a calcium or vitamin D supplement to lower your risk for osteoporosis.  Menopause may have certain physical symptoms and risks.  Hormone replacement therapy may reduce some of these symptoms and risks. Talk to your health care provider about whether hormone replacement therapy is right for you. Follow these instructions at home:  Schedule regular health, dental, and eye exams.  Stay current with your immunizations.  Do not use any tobacco products including cigarettes, chewing tobacco, or electronic cigarettes.  If you are pregnant, do not drink alcohol.  If you are breastfeeding, limit how much and how often you drink alcohol.  Limit alcohol intake to no more than 1 drink per day for nonpregnant women. One drink equals 12 ounces of beer, 5 ounces of wine, or 1 ounces of hard liquor.  Do not use street drugs.  Do not share needles.  Ask your health care provider for help if you need support or information about quitting drugs.  Tell your health care provider if you often feel depressed.  Tell your health care provider if you have ever been abused or do not feel safe at home. This information is not intended to replace advice given to you by your health care provider. Make sure you discuss any questions you have with your health care provider. Document Released: 05/07/2011 Document Revised: 03/29/2016 Document Reviewed: 07/26/2015  2017 Elsevier

## 2016-11-07 NOTE — Progress Notes (Addendum)
WM FRUCHTER Aug 21, 1966 [id number]    History:    Presents for annual exam. Cycles becoming more irregular has skipped several months this past year. Cycles are heavy changing protection every hour passing clots.  Last ultrasound February 2017 approximately 8 fibroids largest being 13 cm. Was planning hysterectomy , procrastinated is now ready to proceed. Normal Pap history. Left breast cancer at age 51 triple negative, lumpectomy, radiation, chemotherapy. BRCA1 negative BRCA 2 positive for unknown significance.  History of HSV rare outbreaks. Hypertension managed by primary care. Same partner.  Past medical history, past surgical history, family history and social history were all reviewed and documented in the EPIC chart.  Works at [employer]. 2 daughters youngest daughter scheduled to graduate in May from college. No breast cancer family history.  ROS:  A ROS was performed and pertinent positives and negatives are included.  Exam:  Vitals:   11/07/16 1608  BP: 124/82  Weight: 149 lb (67.6 kg)  Height: 5' (1.524 m)   Body mass index is 29.1 kg/m.   General appearance:  Normal Thyroid:  Symmetrical, normal in size, without palpable masses or nodularity. Respiratory  Auscultation:  Clear without wheezing or rhonchi Cardiovascular  Auscultation:  Regular rate, without rubs, murmurs or gallops  Edema/varicosities:  Not grossly evident Abdominal  Soft,nontender, without masses, guarding or rebound.  Liver/spleen:  No organomegaly noted  Hernia:  None appreciated  Skin  Inspection:  Grossly normal   Breasts: Examined lying and sitting.  Bilateral implants    Right: Without masses, retractions, discharge or axillary adenopathy.     Left: Without masses, retractions, discharge or axillary adenopathy. Gentitourinary   Inguinal/mons:  Normal without inguinal adenopathy  External genitalia:  Normal  BUS/Urethra/Skene's glands:  Normal  Vagina:  Normal  Cervix:  Normal  Uterus:   Enlarged 22 week size fibroid uterus Midline and mobile  Adnexa/parametria:     Rt: Without masses or tenderness.   Lt: Without masses or tenderness.  Anus and perineum: Normal  Digital rectal exam: Normal sphincter tone without palpated masses or tenderness  Assessment/Plan:  51 y.o. SBF G2 P2 for annual exam with no complaints.  Perimenopausal with menorrhagia/BTL 2002 Left breast cancer age 28/ BRCA negative /triple negative. Multiple fibroid uterus largest being 13 cm HSV rare outbreaks Hypertension primary care manages labs and meds  Plan: Repeat ultrasound and schedule appointment with Dr. Toney Rakes to discuss TAH with BSO.  Continue iron supplements daily, iron rich foods encouraged. We'll check a CBC today. SBE's, continue annual 3-D screening mammogram has scheduled in next week. Valtrex 500 twice daily for 3-5 days when necessary. Encourage regular exercise, leisure activities. UA, Pap normal with negative HR HPV 2016, new screening guidelines reviewed.   Huel Cote [facility], 4:57 PM 11/07/2016

## 2016-11-09 ENCOUNTER — Encounter: Payer: Self-pay | Admitting: Women's Health

## 2016-11-09 NOTE — Telephone Encounter (Signed)
Received a faxed clarification on the Hemocyte prescription from [place].   Spoke with pharmacy.  The Hemocyte is on back order. Told the technician that ferrous fumarate can be used.  It did not need to be breand nameHemocyte.

## 2016-11-12 MED ORDER — FERROUS GLUCONATE 324 (38 FE) MG PO TABS: ORAL_TABLET | ORAL | 3 refills | Status: DC

## 2016-11-12 NOTE — Telephone Encounter (Signed)
Ms Beld called stated that [facility] still does not have the iron tabs. Called [facility] and they have ferrous gluconate on hand as ferrlecit. Gave order for ferrlecit in place of ferrous fumarate. Left message for patient to p/u Ferrlecit.

## 2016-11-12 NOTE — Addendum Note (Signed)
Addended by: Baruch Merl on: 11/12/2016 08:00 PM   Modules accepted: Orders

## 2016-11-14 ENCOUNTER — Ambulatory Visit: Payer: 59

## 2016-11-20 ENCOUNTER — Other Ambulatory Visit: Payer: 59

## 2016-11-27 ENCOUNTER — Other Ambulatory Visit (HOSPITAL_BASED_OUTPATIENT_CLINIC_OR_DEPARTMENT_OTHER): Payer: 59

## 2016-11-27 DIAGNOSIS — D500 Iron deficiency anemia secondary to blood loss (chronic): Secondary | ICD-10-CM

## 2016-11-27 LAB — CBC WITH DIFFERENTIAL/PLATELET
BASO%: 0.3 % (ref 0.0–2.0)
BASOS ABS: 0 10*3/uL (ref 0.0–0.1)
EOS%: 1.7 % (ref 0.0–7.0)
Eosinophils Absolute: 0.1 10*3/uL (ref 0.0–0.5)
HEMATOCRIT: 31 % — AB (ref 34.8–46.6)
HGB: 10.1 g/dL — ABNORMAL LOW (ref 11.6–15.9)
LYMPH#: 1.5 10*3/uL (ref 0.9–3.3)
LYMPH%: 33 % (ref 14.0–49.7)
MCH: 27.6 pg (ref 25.1–34.0)
MCHC: 32.7 g/dL (ref 31.5–36.0)
MCV: 84.5 fL (ref 79.5–101.0)
MONO#: 0.5 10*3/uL (ref 0.1–0.9)
MONO%: 11.4 % (ref 0.0–14.0)
NEUT#: 2.4 10*3/uL (ref 1.5–6.5)
NEUT%: 53.6 % (ref 38.4–76.8)
PLATELETS: 334 10*3/uL (ref 145–400)
RBC: 3.67 10*6/uL — AB (ref 3.70–5.45)
RDW: 15.3 % — ABNORMAL HIGH (ref 11.2–14.5)
WBC: 4.5 10*3/uL (ref 3.9–10.3)

## 2016-11-27 LAB — IRON AND TIBC
%SAT: 31 % (ref 21–57)
Iron: 112 ug/dL (ref 41–142)
TIBC: 360 ug/dL (ref 236–444)
UIBC: 248 ug/dL (ref 120–384)

## 2016-11-27 LAB — FERRITIN: Ferritin: 30 ng/ml (ref 9–269)

## 2016-11-30 ENCOUNTER — Telehealth: Payer: Self-pay

## 2016-11-30 NOTE — Telephone Encounter (Signed)
-----   Message from Gordy Levan, MD sent at 11/29/2016  8:43 AM EST ----- Labs seen and need follow up Please let patient know that iron is still ok, but hemoglobin has dropped from 13 in Sept to 10 now. I see gyn notes about heavy bleeding and plans for hysterectomy. Please let patient know that I encourage her to proceed as gyn recommends.

## 2016-11-30 NOTE — Telephone Encounter (Signed)
S/w pt per Dr Mariana Kaufman attached message. Pt has an ultrasound schedule at gyn and will follow that w/MD appt to make plans. Her last period was not quite as heavy. Discussed appt in March being changed to another oncologist here at [facility] and to expect a call.

## 2016-12-06 ENCOUNTER — Telehealth: Payer: Self-pay | Admitting: Hematology

## 2016-12-06 NOTE — Telephone Encounter (Signed)
spoke to patient to advise of Dr. Mariana Kaufman retirement and to confirm next appointment date of 01/28/17 at 1:45pm for labs and 2:15pm for Dr. Burr Medico

## 2016-12-17 ENCOUNTER — Ambulatory Visit
Admission: RE | Admit: 2016-12-17 | Discharge: 2016-12-17 | Disposition: A | Discharge status: Home / Self Care | Payer: 59 | Source: Ambulatory Visit | Attending: Oncology | Admitting: Oncology

## 2016-12-17 DIAGNOSIS — Z1239 Encounter for other screening for malignant neoplasm of breast: Secondary | ICD-10-CM

## 2016-12-17 DIAGNOSIS — R922 Inconclusive mammogram: Secondary | ICD-10-CM

## 2017-01-10 ENCOUNTER — Encounter: Payer: Self-pay | Admitting: Women's Health

## 2017-01-10 ENCOUNTER — Ambulatory Visit (INDEPENDENT_AMBULATORY_CARE_PROVIDER_SITE_OTHER): Payer: 59 | Admitting: Women's Health

## 2017-01-10 VITALS — BP 112/79 | Ht 61.0 in | Wt 148.2 lb

## 2017-01-10 DIAGNOSIS — B373 Candidiasis of vulva and vagina: Secondary | ICD-10-CM

## 2017-01-10 DIAGNOSIS — B3731 Acute candidiasis of vulva and vagina: Secondary | ICD-10-CM

## 2017-01-10 DIAGNOSIS — B9689 Other specified bacterial agents as the cause of diseases classified elsewhere: Secondary | ICD-10-CM | POA: Diagnosis not present

## 2017-01-10 DIAGNOSIS — N760 Acute vaginitis: Secondary | ICD-10-CM

## 2017-01-10 LAB — WET PREP FOR TRICH, YEAST, CLUE: Trich, Wet Prep: NONE SEEN

## 2017-01-10 MED ORDER — METRONIDAZOLE 500 MG PO TABS: 500.0000 mg | ORAL_TABLET | Freq: Two times a day (BID) | ORAL | 0 refills | Status: DC

## 2017-01-10 MED ORDER — FLUCONAZOLE 150 MG PO TABS: 150.0000 mg | ORAL_TABLET | Freq: Once | ORAL | 1 refills | Status: AC

## 2017-01-10 NOTE — Patient Instructions (Signed)

## 2017-01-10 NOTE — Progress Notes (Signed)
Patient ID: Tara Bell, female   DOB: 1966-07-07, 51 y.o.   MRN: [id number]  Presents with a complaint of itching and vaginal discharge with slight odor. Sexually active/ same partner. History of heavy bleeding, diagnosed with Left breast cancer at age 76, triple negative, lumpectomy, radiation, chemotherapy. BRCA negative.  Having mild lymphedema in left arm no change.  History of HSV rare outbreaks. Denies abdominal pain, pelvic pain, fever or chills. Want to be checked for STI- suspect partner infidelity. Cycles becoming more irregular last cycle in January heavy flow, history of anemia for menorrhagia. Has numerous fibroids largest being 13 cm. Has declined hysterectomy in the past.     Current Outpatient Prescriptions on File Prior to Visit  Medication Sig Dispense Refill  . aspirin EC 81 MG EC tablet Take 1 tablet (81 mg total) by mouth daily.    . Ferrous Fumarate (FERROCITE) 324 (106 Fe) MG TABS tablet Take 1 tablet by mouth daily.    . ferrous gluconate (FERGON) 324 MG tablet Take 1 tablet on an empty stomach with OJ 5 times a week. 25 tablet 3  . ibuprofen (ADVIL,MOTRIN) 600 MG tablet Take 1 tablet (600 mg total) by mouth every 8 (eight) hours as needed. 60 tablet 3  . metoprolol tartrate (LOPRESSOR) 25 MG tablet Take 0.5 tablets (12.5 mg total) by mouth 2 (two) times daily. 30 tablet 11  . nitroGLYCERIN (NITROSTAT) 0.4 MG SL tablet Place 1 tablet (0.4 mg total) under the tongue every 5 (five) minutes as needed for chest pain. NEED OV. 25 tablet 0  . TRIBENZOR 20-5-12.5 MG TABS 1 tablet daily.     . valACYclovir (VALTREX) 500 MG tablet Take 1 tablet (500 mg total) by mouth 2 (two) times daily as needed. Reported on 11/03/2015 30 tablet 12   No current facility-administered medications on file prior to visit.    Allergies  Allergen Reactions  . Nitrofurantoin Monohyd Macro Nausea And Vomiting   ROS: negative except for pertinent positives in the HPI.  BP 112/79   Ht _0   (1.549 m)   Wt 148 lb 3.2 oz (67.2 kg)   LMP 11/20/2016   BMI 28.00 kg/m   Exam: Moderate white discharge, no vulvar /vaginal inflammation. Bimanual exam uterus enlarged 18-20 weeks' size fibroid uterus Wet prep: yeast few, Clue cells moderate, bacteria many, WBC moderate, Epith. Cells (7-12)hpf  ASSESSMENT Bacteria vaginosis Yeast Vaginitis STI screening Perimenopausal Fibroid uterus  PLAN  1. BV (bacterial vaginosis) - WET PREP FOR TRICH, YEAST, CLUE - metroNIDAZOLE (FLAGYL) 500 MG tablet; Take 1 tablet (500 mg total) by mouth 2 (two) times daily.  Dispense: 14 tablet; Refill: 0. Alcohol precaution discussed and given - GC/Chlamydia Probe Amp pending, declines need for HIV, hepatitis or RPR.  2. Yeast vaginitis - fluconazole (DIFLUCAN) 150 MG tablet; Take 1 tablet (150 mg total) by mouth once.  Dispense: 1 tablet; Refill: 1 Use mild, unscented soap and water. Rinse well. After using the toilet, wipe from front to back to avoid spreading yeast or bacteria from your anus to the vagina or urinary tract. Wear underwear that helps keep your genital area dry and doesn't hold in warmth and moisture. Keep underwear off at night while sleeping. 3.  Assurance regarding perimenopausal, fibroid uterus causing heavy cycles still contemplating hysterectomy for first await.   Elon Alas [facility]

## 2017-01-21 NOTE — Progress Notes (Signed)
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 The tumor was triple negative (ER/PR negative and HER 2 1+ ,CISH not done in 2002), treated with left partial mastectomy with axillary node evaluation by Dr.Streck, adjuvant chemotherapy with adriamycin/cytoxan x 4 followed by taxotere weekly x 12 by Dr. Starr Sinclair, radiation to breast/axilla/supraclavicular region by Dr.Kinard, and since then on observation without known active disease. 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 Last bilateral mammograms were at [facility] 10[facility]28[facility]12 with breast tissue still extremely dense but no mammographic findings of concern; she has submuscular saline implants bilaterally. Last breast MRI was 11-14-11. With the BRCA 2 abnormality (tho VUS),  extremely dense breast tissue and this history,  yearly MRI is felt medically necessary  HISTORY OF PRESENT ILLNESS (From Dr. Mariana Kaufman note on 07/16/2016): Patient is seen, alone for visit, in scheduled follow up of premenopausal triple negative 9 node + left breast cancer, this diagnosed 2002 at age 51. She has had no known recurrent disease since left partial mastectomy and adjuvant chemo and radiation completed in 2003. She has BRCA 2 VUS.  Last mammograms were bilateral 11-22-15 with bilateral breast MRI 11-23-15, then right breast biopsy 12-2015 showing fibroadenoma.    In past year she had heavy and irregular vaginal bleeding, with slight elevation of CA  125 in 05-2015 which was back in normal range 06-2015. She was treated for iron deficiency anemia related to heavy gyn blood loss. She had sonohystogram and pelvic US 12-2015 with apparent large uterine fibroid. Dr Toney Rakes recommended hysterectomy BSO, which patient has not yet scheduled. She tells me that she plans to have this done. Menstrual bleeding has been much lighter for past several months, and hemoglobin is back up to 13 today. I do NOT find that she had adjuvant tamoxifen, note triple negative breast CA.   She has slight lymphedema LUE related to breast cancer treatment. This has been more bothersome recently, with soreness around left elbow. 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 She has not worn compression sleeve in several years (?). She is willing to be referred back to lymphedema PT for evaluation and treatment, hopefully to include another compression garment.   She has otherwise felt well, with no recent infectious illness, no new or different pain otherwise, no changes in either breast that she can tell. Appetite and energy are good, no GI symptoms, no other bleeding, no respiratory symptoms, no swelling LE.  Excessive lacrimation since taxotere unchanged, moreso right than left eye; she does not want reevaluation by ophthalmology, as stents were difficult to tolerate previously.   INTERVAL HISTORY: Jeylin returns for follow up. She was previously under my partner Dr. Mariana Kaufman care, who has recently retired. This is my first encounter with her. She is doing well today. She gets annual mammograms and occasional MRIs. She still has some numbness at her incision site. Denies hot flashes, breast pain, fatigue, loss of appetite, chest pain, SOB, or any other concerns.   No known family  history of breast cancer. She had surgery, chemotherapy, and radiation back in 2002. She has implants in place. She still gets her period, but it is irregular and hasn't been heavy since  December 2017. Her period was heavy previously. She does not take any contraceptives or hormone replacements.   REVIEW OF SYSTEMS:   Constitutional: Denies fevers, chills or abnormal weight loss Eyes: Denies blurriness of vision Ears, nose, mouth, throat, and face: Denies mucositis or sore throat Respiratory: Denies cough, dyspnea or wheezes Cardiovascular: Denies palpitation, chest discomfort or lower extremity swelling Gastrointestinal:  Denies nausea, heartburn or change in bowel habits Skin: Denies abnormal skin rashes Lymphatics: Denies new lymphadenopathy or easy bruising Neurological:Denies numbness, tingling or new weaknesses Behavioral/Psych: Mood is stable, no new changes  Breasts: (+) numbness at incision site All other systems were reviewed with the patient and are negative.  MEDICAL HISTORY:  Past Medical History:  Diagnosis Date  . BRCA1 negative 08/2011  . 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 Breast cancer ([place]) SEPTEMBER 2002   LEFT BREAST-SURGERY,CHEMO,RADIATION-DR.LIVESAY  . Hypertension   . 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 Lymphedema of arm    DUE TO LEFT   AXILLARY NODE DISSECTION   AND RADIATION  . Migraines   . S/P cardiac catheterization, with normal coronary arteries and normal EF, 07/01/13 07/13/2013    SURGICAL HISTORY: Past Surgical History:  Procedure Laterality Date  . AUGMENTATION MAMMAPLASTY  2008  . BILATERAL TUBAL    . BREAST BIOPSY    . BREAST LUMPECTOMY     AE 51  . CARDIAC CATHETERIZATION  07/01/13   Angiographically normal coronary arteries with normal left atrial function  . HYSTEROSCOPY  12/10/07   RESECTOSCOPIC POLYPECTOMY/MYOMECTOMY  . LEFT HEART CATHETERIZATION WITH CORONARY ANGIOGRAM N/A 07/01/2013   Procedure: LEFT HEART CATHETERIZATION WITH CORONARY ANGIOGRAM;  Surgeon: Leonie Man, MD;  Location: [facility] CATH LAB;  Service: Cardiovascular;  Laterality: N/A;  . MASTECTOMY, PARTIAL   07/23/2001   LEFT BREAST WITH AXILLARY NODE EVALUATION  . TUBAL LIGATION     BILATERAL    I have reviewed the social history and family history with the patient and they are unchanged from previous note.  ALLERGIES:  is allergic to nitrofurantoin monohyd macro.  MEDICATIONS:  Current Outpatient Prescriptions  Medication Sig Dispense Refill  . aspirin EC 81 MG EC tablet Take 1 tablet (81 mg total) by mouth daily.    . Ferrous Fumarate (FERROCITE) 324 (106 Fe) MG TABS tablet Take 1 tablet by mouth daily.    Marland Kitchen ibuprofen (ADVIL,MOTRIN) 600 MG tablet Take 1 tablet (600 mg total) by mouth every 8 (eight) hours as needed. 60 tablet 3  . metoprolol tartrate (LOPRESSOR) 25 MG tablet Take 0.5 tablets (12.5 mg total) by mouth 2 (two) times daily. 30 tablet 11  . TRIBENZOR 20-5-12.5 MG TABS 1 tablet daily.     . valACYclovir (VALTREX) 500 MG tablet Take 1 tablet (500 mg total) by mouth 2 (two) times daily as needed. Reported on 11/03/2015 30 tablet 12  . nitroGLYCERIN (NITROSTAT) 0.4 MG SL tablet Place 1 tablet (0.4 mg total) under the tongue every 5 (five) minutes as needed for chest pain. NEED OV. (Patient not taking: Reported on 01/28/2017) 25 tablet 0   No current facility-administered medications for this visit.     PHYSICAL EXAMINATION: ECOG PERFORMANCE STATUS: 0 - Asymptomatic  Vitals:   01/28/17 1447  BP: 129/84  Pulse: 68  Resp: 18  Temp: 97.9 F (36.6 C)   Filed Weights   01/28/17 1447  Weight: 146 lb 6.4 oz (66.4 kg)   GENERAL:alert, no distress and comfortable SKIN: skin color, texture, turgor are normal, no rashes or significant lesions EYES: normal, Conjunctiva are pink and non-injected, sclera clear OROPHARYNX:no exudate, no erythema and lips, buccal mucosa, and tongue normal  NECK: supple, thyroid normal size, non-tender, without nodularity LYMPH:  no palpable lymphadenopathy in the cervical, axillary or inguinal LUNGS: clear to auscultation and percussion with normal  breathing effort HEART: regular rate & rhythm and no murmurs and no lower extremity edema ABDOMEN:abdomen soft, non-tender and normal bowel sounds Musculoskeletal:no cyanosis of digits and no clubbing  NEURO: alert & oriented x 3 with fluent speech, no focal motor/sensory deficits Breasts: s/p left partial mastectomy with b/l implant. Palpation of the breasts and axilla revealed no obvious mass that I could appreciate.  LABORATORY DATA:  I have reviewed the data as listed CBC Latest Ref Rng & Units 01/28/2017 11/27/2016 07/16/2016  WBC 3.9 - 10.3 10e3/uL 6.7 4.5 7.3  Hemoglobin 11.6 - 15.9 g/dL 12.4 10.1(L) 13.0  Hematocrit 34.8 - 46.6 % 38.2 31.0(L) 39.0  Platelets 145 - 400 10e3/uL 258 334 243   CMP Latest Ref Rng & Units 01/28/2017 07/16/2016 11/03/2015  Glucose 70 - 140 mg/dl 82 85 98  BUN 7.0 - 26.0 mg/dL 11.7 11.7 12.5  Creatinine 0.6 - 1.1 mg/dL 0.8 0.9 0.9  Sodium 136 - 145 mEq/L 139 139 141  Potassium 3.5 - 5.1 mEq/L 3.8 3.7 3.9  Chloride 96 - 112 mEq/L - - -  CO2 22 - 29 mEq/L '26 25 25  '$ Calcium 8.4 - 10.4 mg/dL 9.7 9.7 9.1  Total Protein 6.4 - 8.3 g/dL 8.4(H) 8.5(H) 7.2  Total Bilirubin 0.20 - 1.20 mg/dL 0.30 <0.30 0.44  Alkaline Phos 40 - 150 U/L 78 91 66  AST 5 - 34 U/L '18 21 18  '$ ALT 0 - 55 U/L '15 21 14    '$ PATHOLOGY: Diagnosis 12/12/2015 Endometrium, biopsy, uterus - PROLIFERATIVE ENDOMETRIUM WITH BREAK DOWN. - NO HYPERPLASIA, ATYPIA OR MALIGNANCY IDENTIFIED.  RADIOGRAPHIC STUDIES: I have personally reviewed the radiological images as listed and agreed with the findings in the report.  Mammogram b/l 12/17/2016 IMPRESSION: No mammographic evidence of malignancy. A result letter of this screening mammogram will be mailed directly to the patient.  ASSESSMENT & PLAN:  Morghan is a 51 y.o. premenopausal female with:  1. T2 N1 (9 node +) triple negative left breast carcinoma in 2002:  -I reviewed her chart extensively, and confirmed key findings with patient . -She is  clinically doing well. Lab results reviewed, today's exam was unremarkable today, her last mammogram one months ago was normal, no clinical concern for recurrence. -It has been 16 years since her initial diagnosis, her risk of previous triple negative breast cancer is minimal now. However she is at high risk for secondary breast cancer and I recommend her to continue breast cancer surveillance -She will continue annual screening mammogram and MRI -Last mammogram 12/2016. Last MRI was 11/2015. Her breast density is grade C so I have recommended a breast MRI every other year. Next due 11/2017.  -she is s/p bilateral breast reconstructions, saline implants retropectoral -I encouraged her to have healthy diet, and exercise regularly.   2. fibroadenoma right breast  - biopsy 12-2015.  -Seen by Dr Dalbert Batman, no further surgery.   3. iron deficiency anemia  -history of  heavy menstrual bleeding ongoing:  -anemia resolved. - Still would need to check for anemia if significant bleeding in future. -Continue iron pill   4. uterine fibroids and menorrhagia:  -WIth her breast cancer history and BRCA 2 finding, BSO would be appropriate with the hysterectomy.  -she will follow up with her GYN   5. lymphedema LUE  -related to breast cancer treatment. -She will continue wearing sleeves and exercise   6. 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   7. HTN -controlled by medication -managed by PCP  8. Genetics  -Due to her young age at her diagnosis of breast cancer, she was referred to genetics, last updated test done in 2014 -BRCA2 c.9565C>G, PTEN c.[id number], and TP53 c.97-6C>T, all are VUSs found on Myrisk testing.  The [facility] gene panel offered by [employer] includes sequencing and deletion/duplication testing of the following 25 genes: APC, ATM,  BARD1, BMPR1A, BRCA1, BRCA2, BRIP1, CHD1, CDK4, CDKN2A, CHEK2, EPCAM (large rearrangement only), MLH1, MSH2, MSH6, MUTYH, NBN, PALB2, PMS2, PTEN, RAD51C, RAD51D, SMAD4, STK11, and TP53.  The report date is February 13, 2013.  Plan -Mammogram 12/2017. Breast MRI 12/2017 -she sees her GYN in Feb annually -Return for f/u in September 2018. Breast exam twice a year. Repeat iron study at this visit.  -I plan to see her back once a year in Sep after next visit    All questions were answered. The patient knows to call the clinic with any problems, questions or concerns. No barriers to learning was detected.  I spent 20 minutes counseling the patient face to face. The total time spent in the appointment was 30 minutes and more than 50% was on counseling and review of test results  This document serves as a record of services personally performed by Truitt Merle, MD. It was created on her behalf by Martinique Casey, a trained medical scribe. The creation of this record is based on the scribe's personal observations and the provider's statements to them. This document has been checked and approved by the attending provider.  I have reviewed the above documentation for accuracy and completeness and I agree with the above.   Truitt Merle, MD

## 2017-01-28 ENCOUNTER — Encounter: Payer: Self-pay | Admitting: Hematology

## 2017-01-28 ENCOUNTER — Other Ambulatory Visit (HOSPITAL_BASED_OUTPATIENT_CLINIC_OR_DEPARTMENT_OTHER): Payer: 59

## 2017-01-28 ENCOUNTER — Telehealth: Payer: Self-pay | Admitting: Hematology

## 2017-01-28 ENCOUNTER — Ambulatory Visit (HOSPITAL_BASED_OUTPATIENT_CLINIC_OR_DEPARTMENT_OTHER): Payer: 59 | Admitting: Hematology

## 2017-01-28 ENCOUNTER — Other Ambulatory Visit: Payer: Self-pay | Admitting: Hematology

## 2017-01-28 VITALS — BP 129/84 | HR 68 | Temp 97.9°F | Resp 18 | Ht 61.0 in | Wt 146.4 lb

## 2017-01-28 DIAGNOSIS — D241 Benign neoplasm of right breast: Secondary | ICD-10-CM | POA: Diagnosis not present

## 2017-01-28 DIAGNOSIS — Z171 Estrogen receptor negative status [ER-]: Principal | ICD-10-CM

## 2017-01-28 DIAGNOSIS — I890 Lymphedema, not elsewhere classified: Secondary | ICD-10-CM | POA: Diagnosis not present

## 2017-01-28 DIAGNOSIS — D259 Leiomyoma of uterus, unspecified: Secondary | ICD-10-CM

## 2017-01-28 DIAGNOSIS — H04559 Acquired stenosis of unspecified nasolacrimal duct: Secondary | ICD-10-CM | POA: Diagnosis not present

## 2017-01-28 DIAGNOSIS — N920 Excessive and frequent menstruation with regular cycle: Secondary | ICD-10-CM | POA: Diagnosis not present

## 2017-01-28 DIAGNOSIS — Z853 Personal history of malignant neoplasm of breast: Secondary | ICD-10-CM | POA: Diagnosis not present

## 2017-01-28 DIAGNOSIS — C50019 Malignant neoplasm of nipple and areola, unspecified female breast: Secondary | ICD-10-CM

## 2017-01-28 DIAGNOSIS — D500 Iron deficiency anemia secondary to blood loss (chronic): Secondary | ICD-10-CM

## 2017-01-28 DIAGNOSIS — I10 Essential (primary) hypertension: Secondary | ICD-10-CM | POA: Diagnosis not present

## 2017-01-28 LAB — CBC WITH DIFFERENTIAL/PLATELET
BASO%: 0.1 % (ref 0.0–2.0)
Basophils Absolute: 0 10*3/uL (ref 0.0–0.1)
EOS%: 1.2 % (ref 0.0–7.0)
Eosinophils Absolute: 0.1 10*3/uL (ref 0.0–0.5)
HCT: 38.2 % (ref 34.8–46.6)
HGB: 12.4 g/dL (ref 11.6–15.9)
LYMPH%: 24 % (ref 14.0–49.7)
MCH: 27.3 pg (ref 25.1–34.0)
MCHC: 32.5 g/dL (ref 31.5–36.0)
MCV: 84 fL (ref 79.5–101.0)
MONO#: 0.5 10*3/uL (ref 0.1–0.9)
MONO%: 6.8 % (ref 0.0–14.0)
NEUT#: 4.6 10*3/uL (ref 1.5–6.5)
NEUT%: 67.9 % (ref 38.4–76.8)
PLATELETS: 258 10*3/uL (ref 145–400)
RBC: 4.55 10*6/uL (ref 3.70–5.45)
RDW: 17.9 % — ABNORMAL HIGH (ref 11.2–14.5)
WBC: 6.7 10*3/uL (ref 3.9–10.3)
lymph#: 1.6 10*3/uL (ref 0.9–3.3)

## 2017-01-28 LAB — COMPREHENSIVE METABOLIC PANEL
ALT: 15 U/L (ref 0–55)
ANION GAP: 9 meq/L (ref 3–11)
AST: 18 U/L (ref 5–34)
Albumin: 3.9 g/dL (ref 3.5–5.0)
Alkaline Phosphatase: 78 U/L (ref 40–150)
BUN: 11.7 mg/dL (ref 7.0–26.0)
CO2: 26 meq/L (ref 22–29)
CREATININE: 0.8 mg/dL (ref 0.6–1.1)
Calcium: 9.7 mg/dL (ref 8.4–10.4)
Chloride: 104 mEq/L (ref 98–109)
EGFR: >90 mL/min/{1.73_m2} (ref >90)
Glucose: 82 mg/dl (ref 70–140)
POTASSIUM: 3.8 meq/L (ref 3.5–5.1)
Sodium: 139 mEq/L (ref 136–145)
Total Bilirubin: 0.3 mg/dL (ref 0.20–1.20)
Total Protein: 8.4 g/dL — ABNORMAL HIGH (ref 6.4–8.3)

## 2017-01-28 NOTE — Telephone Encounter (Signed)
Gave patient AVS and calender 01/28/2017 los. Follow up and lab in 6 months.

## 2017-03-20 ENCOUNTER — Encounter: Payer: Self-pay | Admitting: Gynecology

## 2017-03-29 ENCOUNTER — Other Ambulatory Visit: Payer: Self-pay | Admitting: Women's Health

## 2017-03-29 DIAGNOSIS — B9689 Other specified bacterial agents as the cause of diseases classified elsewhere: Secondary | ICD-10-CM

## 2017-03-29 DIAGNOSIS — N760 Acute vaginitis: Principal | ICD-10-CM

## 2017-04-16 ENCOUNTER — Other Ambulatory Visit: Payer: Self-pay | Admitting: *Deleted

## 2017-04-17 ENCOUNTER — Other Ambulatory Visit: Payer: Self-pay | Admitting: *Deleted

## 2017-04-17 MED ORDER — FERROUS FUMARATE 324 (106 FE) MG PO TABS: 1.0000 | ORAL_TABLET | Freq: Every day | ORAL | 3 refills | Status: DC

## 2017-06-05 ENCOUNTER — Telehealth: Payer: Self-pay | Admitting: Women's Health

## 2017-06-05 NOTE — Telephone Encounter (Signed)
Telephone call to patient after message left to call. History of large fibroids, greater than 10 CM fibroids, breast cancer history, has been recommended to have a TAH with BSO. Had wanted to wait until children have graduated college which they have now both graduated and is now ready to proceed with hysterectomy. Had spoke with Dr. Toney Rakes in the past but was not ready at that time. Instructed to schedule appointment with Dr. Phineas Real to discuss.

## 2017-07-02 ENCOUNTER — Encounter: Payer: Self-pay | Admitting: Gynecology

## 2017-07-02 ENCOUNTER — Ambulatory Visit (INDEPENDENT_AMBULATORY_CARE_PROVIDER_SITE_OTHER): Payer: 59 | Admitting: Gynecology

## 2017-07-02 VITALS — BP 116/76

## 2017-07-02 DIAGNOSIS — C50919 Malignant neoplasm of unspecified site of unspecified female breast: Secondary | ICD-10-CM

## 2017-07-02 DIAGNOSIS — Z1501 Genetic susceptibility to malignant neoplasm of breast: Secondary | ICD-10-CM | POA: Diagnosis not present

## 2017-07-02 DIAGNOSIS — N926 Irregular menstruation, unspecified: Secondary | ICD-10-CM

## 2017-07-02 DIAGNOSIS — D259 Leiomyoma of uterus, unspecified: Secondary | ICD-10-CM

## 2017-07-02 NOTE — Progress Notes (Signed)
    Tara Bell 02/28/1966 [id number]        51 y.o.  G2P2 presents to discuss surgery. History of large leiomyoma, BRCA gene of unknown significance, history of breast cancer and heavy irregular menses. Had been planning on hysterectomy by Dr. Fernandez but this had been postponed and she is now ready to proceed with surgery. She does note though that her menses are no longer heavy as far as bleedthrough episodes double protection but she is bleeding somewhat on and off lighter than she was previously. Not having significant hot flushes or night sweats. Does feel pressure from her leiomyoma with an enlarged abdomen.  Had sent histogram last year with negative endometrial biopsy with ultrasound showing multiple myomas and some encroachment on the endometrial cavity. Pap smear/HPV negative 10/2015 with no history of abnormal Pap smears.  Past medical history,surgical history, problem list, medications, allergies, family history and social history were all reviewed and documented in the EPIC chart.  Directed ROS with pertinent positives and negatives documented in the history of present illness/assessment and plan.  Exam: Kim Gardner assistant Vitals:   07/02/17 1511  BP: 116/76   General appearance:  Normal Abdomen with palpable pelvic mass 2 fingerbreadths below umbilicus. Pelvic external BUS vagina normal. Cervix normal. Uterus markedly enlarged to approximately 20 weeks size irregular consistent with leiomyoma. Adnexa unable to evaluate.  Assessment/Plan:  51 y.o. G2P2 with enlarged leiomyoma 20 weeks size symptomatic with pressure and discomfort to the patient. Bleeding is not heavy but somewhat irregular. Ultrasound last year without evidence of ovarian pathology with negative endometrial biopsy. CA-125 2016 was 28. History of breast cancer with BRCA variant of unknown significance. I reviewed the situation with the patient and the options. Although her bleeding is no longer heavy  which was one of the main indications for hysterectomy she continues to have pressure and discomfort from her markedly enlarged leiomyoma. She also has the variant of unknown significance which may or may not prove to be an issue as far as ovarian cancer risk in the future. At this point the patient wants to proceed with hysterectomy. I think the most prudent course would be to proceed with TAH/BSO. The issues of removing her ovaries at this point as a risk reductive surgery was discussed in light of her questionable genetic mutation. She is triple negative breast cancer and the issue as far as whether this would have any benefit from a breast cancer issue was also discussed. We'll go ahead and move toward scheduling surgery and she will represent for a full preoperative consult before hand. She has no aversion to transfusions if needed. She had stopped her iron daily supplement of*go ahead and restart that now to maximize her hemoglobin. Her most recent hemoglobin was 12 in March 2018. We'll also schedule an ultrasound to rule out nonpalpable/ovarian disease and obtain a preoperative CA 125 and hemoglobin check at her ultrasound appointment.    FONTAINE,TIMOTHY P MD, 3:35 PM 07/02/2017      

## 2017-07-02 NOTE — Patient Instructions (Signed)
Office will call you to arrange surgery. 

## 2017-07-04 ENCOUNTER — Other Ambulatory Visit: Payer: Self-pay | Admitting: Gynecology

## 2017-07-04 ENCOUNTER — Telehealth: Payer: Self-pay

## 2017-07-04 DIAGNOSIS — D250 Submucous leiomyoma of uterus: Secondary | ICD-10-CM

## 2017-07-04 DIAGNOSIS — N926 Irregular menstruation, unspecified: Secondary | ICD-10-CM

## 2017-07-04 DIAGNOSIS — Z853 Personal history of malignant neoplasm of breast: Secondary | ICD-10-CM

## 2017-07-04 NOTE — Telephone Encounter (Signed)
I called patient to discuss scheduling surgery. She has decided she wants to schedule for December. Meantime, she will schedule the u/s and labs that Dr. Loetta Rough has recommended. Orders placed.  I did review ins benefits and estimated surgery prepymt amount and will go ahead now and send financial letter so that she can plan for this.

## 2017-07-23 ENCOUNTER — Other Ambulatory Visit: Payer: Self-pay | Admitting: Gynecology

## 2017-07-23 DIAGNOSIS — D251 Intramural leiomyoma of uterus: Secondary | ICD-10-CM

## 2017-07-31 ENCOUNTER — Other Ambulatory Visit: Payer: Self-pay | Admitting: Gynecology

## 2017-07-31 ENCOUNTER — Encounter: Payer: Self-pay | Admitting: Gynecology

## 2017-07-31 ENCOUNTER — Ambulatory Visit (INDEPENDENT_AMBULATORY_CARE_PROVIDER_SITE_OTHER): Payer: 59

## 2017-07-31 ENCOUNTER — Ambulatory Visit (INDEPENDENT_AMBULATORY_CARE_PROVIDER_SITE_OTHER): Payer: 59 | Admitting: Gynecology

## 2017-07-31 VITALS — BP 118/76

## 2017-07-31 DIAGNOSIS — D251 Intramural leiomyoma of uterus: Secondary | ICD-10-CM

## 2017-07-31 DIAGNOSIS — N83202 Unspecified ovarian cyst, left side: Secondary | ICD-10-CM | POA: Diagnosis not present

## 2017-07-31 DIAGNOSIS — D259 Leiomyoma of uterus, unspecified: Secondary | ICD-10-CM

## 2017-07-31 DIAGNOSIS — N852 Hypertrophy of uterus: Secondary | ICD-10-CM | POA: Diagnosis not present

## 2017-07-31 NOTE — Progress Notes (Signed)
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 The tumor was triple negative (ER/PR negative and HER 2 1+ ,CISH not done in 2002), treated with left partial mastectomy with axillary node evaluation by TaraStreck, adjuvant chemotherapy with adriamycin/cytoxan x 4 followed by taxotere weekly x 12 by Dr. Starr Bell, radiation to breast/axilla/supraclavicular region by TaraKinard, and since then on observation without known active disease. 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 Last bilateral mammograms were at [facility] 10-28-12 with breast tissue still extremely dense but no mammographic findings of concern; she has submuscular saline implants bilaterally. Last breast MRI was 11-14-11. With the BRCA 2 abnormality (tho VUS),  extremely dense breast tissue and this history,  yearly MRI is felt medically necessary  HISTORY OF PRESENT ILLNESS (From Dr. Mariana Bell note on 07/16/2016): Patient is seen, alone for visit, in scheduled follow up of premenopausal triple negative 9 node + left breast cancer, this diagnosed 2002 at age 51. She has had no known recurrent disease since left partial mastectomy and adjuvant chemo and radiation completed in 2003. She has BRCA 2 VUS.  Last mammograms were bilateral 11-22-15 with bilateral breast MRI 11-23-15, then right breast biopsy 12-2015 showing fibroadenoma.    In past year she had heavy and irregular vaginal bleeding, with slight elevation of CA  125 in 05-2015 which was back in normal range 06-2015. She was treated for iron deficiency anemia related to heavy gyn blood loss. She had sonohystogram and pelvic US 12-2015 with apparent large uterine fibroid. Dr Tara Bell recommended hysterectomy BSO, which patient has not yet scheduled. She tells me that she plans to have this done. Menstrual bleeding has been much lighter for past several months, and hemoglobin is back up to 13 today. I do NOT find that she had adjuvant tamoxifen, note triple negative breast CA.   She has slight lymphedema LUE related to breast cancer treatment. This has been more bothersome recently, with soreness around left elbow. 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 She has not worn compression sleeve in several years (?). She is willing to be referred back to lymphedema PT for evaluation and treatment, hopefully to include another compression garment.   She has otherwise felt well, with no recent infectious illness, no new or different pain otherwise, no changes in either breast that she can tell. Appetite and energy are good, no GI symptoms, no other bleeding, no respiratory symptoms, no swelling LE.  Excessive lacrimation since taxotere unchanged, moreso right than left eye; she does not want reevaluation by ophthalmology, as stents were difficult to tolerate previously.    CURRENT THERAPY: Surveillence   INTERVAL HISTORY:  Tara Bell returns for follow up of her left breast cancer. I last saw her 6 months ago. She presents to the clinic today reporting nothing new in the past months. She denies any new pains.  She had a vaginal [place] yesterday due to uterine fibroids. She is trying for surgery. Tara Bell recommended a complete hysterectomy due to her  BRCA2 positive. She is hoping she will be able to pay for it. Last period of May 9th, and spotted in June/July. Her lymphedema from surgery is tolerable with compression sleeve. She rather follow up with [place]  then soley her PCP. She will see her PCP in November for HTN and every 6 months. Overall she is doing well.    REVIEW OF SYSTEMS:   Constitutional: Denies fevers, chills or abnormal weight loss Eyes: Denies blurriness of vision Ears, nose, mouth, throat, and face: Denies mucositis or sore throat Respiratory: Denies cough, dyspnea or wheezes Cardiovascular: Denies palpitation, chest discomfort or lower extremity swelling Gastrointestinal:  Denies nausea, heartburn or change in bowel habits Skin: Denies abnormal skin rashes Lymphatics: Denies new lymphadenopathy or easy bruising   Neurological:Denies numbness, tingling or new weaknesses Behavioral/Psych: Mood is stable, no new changes  Breasts: (+) numbness at incision site All other systems were reviewed with the patient and are negative.  MEDICAL HISTORY:  Past Medical History:  Diagnosis Date  . BRCA1 negative 08/2011  . 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 Breast cancer ([place]) SEPTEMBER 2002   LEFT BREAST-SURGERY,CHEMO,RADIATION-TaraLIVESAY  . Hypertension   . 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 Lymphedema of arm    DUE TO LEFT   AXILLARY NODE DISSECTION   AND RADIATION  . Migraines   . S/P cardiac catheterization, with normal coronary arteries and normal EF, 07/01/13 07/13/2013    SURGICAL HISTORY: Past Surgical History:  Procedure Laterality Date  . AUGMENTATION MAMMAPLASTY  2008  . BILATERAL TUBAL    . BREAST BIOPSY    . BREAST LUMPECTOMY     AE 51  . CARDIAC CATHETERIZATION  07/01/13   Angiographically normal coronary arteries with normal left atrial function  . HYSTEROSCOPY  12/10/07   RESECTOSCOPIC POLYPECTOMY/MYOMECTOMY  . LEFT HEART CATHETERIZATION WITH CORONARY ANGIOGRAM N/A 07/01/2013   Procedure: LEFT HEART CATHETERIZATION WITH CORONARY ANGIOGRAM;  Surgeon: Leonie Man, MD;  [facility] CATH LAB;  Service: Cardiovascular;  Laterality: N/A;  . MASTECTOMY,  PARTIAL  07/23/2001   LEFT BREAST WITH AXILLARY NODE EVALUATION  . TUBAL LIGATION     BILATERAL    I have reviewed the social history and family history with the patient and they are unchanged from previous note.  ALLERGIES:  is allergic to nitrofurantoin monohyd macro.  MEDICATIONS:  Current Outpatient Prescriptions  Medication Sig Dispense Refill  . Ferrous Fumarate (FERROCITE) 324 (106 Fe) MG TABS tablet Take 1 tablet (106 mg of iron total) by mouth daily. Take one tablet by mouth 5 times /week 25 tablet 3  . ibuprofen (ADVIL,MOTRIN) 600 MG tablet Take 1 tablet (600 mg total) by mouth every 8 (eight) hours as needed. 60 tablet 3  . metoprolol tartrate (LOPRESSOR) 25 MG tablet Take 0.5 tablets (12.5 mg total) by mouth 2 (two) times daily. 30 tablet 11  . TRIBENZOR 20-5-12.5 MG TABS 1 tablet daily.     . valACYclovir (VALTREX) 500 MG tablet Take 1 tablet (500 mg total) by mouth 2 (two) times daily as needed. Reported on 11/03/2015 30 tablet 12   No current facility-administered medications for this visit.     PHYSICAL EXAMINATION: ECOG PERFORMANCE STATUS: 0 - Asymptomatic  Vitals:   08/02/17 1502  BP: 130/75  Pulse: 71  Resp: 18  Temp: 98.3 F (36.8 C)  SpO2: 100%   Filed Weights   08/02/17 1502  Weight: 150 lb 14.4  oz (68.4 kg)     GENERAL:alert, no distress and comfortable SKIN: skin color, texture, turgor are normal, no rashes or significant lesions EYES: normal, Conjunctiva are pink and non-injected, sclera clear OROPHARYNX:no exudate, no erythema and lips, buccal mucosa, and tongue normal  NECK: supple, thyroid normal size, non-tender, without nodularity LYMPH:  no palpable lymphadenopathy in the cervical, axillary or inguinal LUNGS: clear to auscultation and percussion with normal breathing effort HEART: regular rate & rhythm and no murmurs and no lower extremity edema ABDOMEN:abdomen soft, non-tender and normal bowel sounds Musculoskeletal:no cyanosis of digits  and no clubbing  NEURO: alert & oriented x 3 with fluent speech, no focal motor/sensory deficits Breasts: s/p left partial mastectomy with b/l implant. Palpation of the breasts and axilla revealed no obvious mass or adenopathy that I could appreciate.  LABORATORY DATA:  I have reviewed the data as listed CBC Latest Ref Rng & Units 08/02/2017 07/31/2017 01/28/2017  WBC 3.9 - 10.3 10e3/uL 7.2 8.5 6.7  Hemoglobin 11.6 - 15.9 g/dL 12.8 12.9 12.4  Hematocrit 34.8 - 46.6 % 39.1 38.2 38.2  Platelets 145 - 400 10e3/uL 249 280 258   CMP Latest Ref Rng & Units 08/02/2017 01/28/2017 07/16/2016  Glucose 70 - 140 mg/dl 108 82 85  BUN 7.0 - 26.0 mg/dL 10.3 11.7 11.7  Creatinine 0.6 - 1.1 mg/dL 0.8 0.8 0.9  Sodium 136 - 145 mEq/L 139 139 139  Potassium 3.5 - 5.1 mEq/L 3.5 3.8 3.7  Chloride 96 - 112 mEq/L - - -  CO2 22 - 29 mEq/L _0 Calcium 8.4 - 10.4 mg/dL 9.5 9.7 9.7  Total Protein 6.4 - 8.3 g/dL 7.9 8.4(H) 8.5(H)  Total Bilirubin 0.20 - 1.20 mg/dL 0.43 0.30 <0.30  Alkaline Phos 40 - 150 U/L 67 78 91  AST 5 - 34 U/L _1 ALT 0 - 55 U/L _2 PATHOLOGY: Diagnosis 12/12/2015 Endometrium, biopsy, uterus - PROLIFERATIVE ENDOMETRIUM WITH BREAK DOWN. - NO HYPERPLASIA, ATYPIA OR MALIGNANCY IDENTIFIED.  RADIOGRAPHIC STUDIES: I have personally reviewed the radiological images as listed and agreed with the findings in the report.  Mammogram b/l 12/17/2016 IMPRESSION: No mammographic evidence of malignancy. A result letter of this screening mammogram will be mailed directly to the patient.  ASSESSMENT & PLAN:  Gianella is a 51 y.o. premenopausal female with:  1. T2 N1 (9 node +) triple negative left breast carcinoma in 2002:  -I reviewed her chart extensively, and confirmed key findings with patient . -It has been 16 years since her initial diagnosis, her risk of previous triple negative breast cancer is minimal now. However she is at high risk for secondary breast cancer and I  recommend her to continue breast cancer surveillance -She will continue annual screening mammogram and MRI -she is s/p bilateral breast reconstructions, saline implants retropectoral -I encouraged her to have healthy diet, and exercise regularly.  -Last mammogram 12/2016. Last MRI was 11/2015. Her breast density is grade C so I have recommended a breast MRI every other year. Next due 11/2017.  -She is clinically doing well. Lab results reviewed and she is not anemic, today's exam was unremarkable, her 2018 mammogram was normal, no clinical concern for recurrence. -Next mammogram in 12/2017 and breast MRI in 03/2018  -Her sister was Dx with Colon cancer 2 months ago and will set up a colonoscopy through her PCP for further screening -We will continue to follow her once a year    2.  fibroadenoma right breast  - biopsy 12-2015.  -Seen by Dr Dalbert Batman, no further surgery.   3. iron deficiency anemia  -history of heavy menstrual bleeding ongoing:  -anemia resolved. -she will have a hysterectomy for the fibroid -Continue iron pill for now    4. uterine fibroids and menorrhagia:  -WIth her breast cancer history and BRCA 2 finding, BSO would be appropriate with the hysterectomy.  -she will follow up with her GYN  -She elected for complete hysterectomy in 10/2017 -Her Ca 125 from 07/31/17 was 35  5. lymphedema LUE  -related to breast cancer treatment. -She will continue wearing sleeves and exercise  -Tolerable with compression sleeve   6. 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   7. HTN -controlled by medication -managed by PCP  8. Genetics  -Due to her young age at her diagnosis of breast cancer, she was referred to genetics, last updated test done in 2014 -BRCA2 VUS c.9565C>G, PTEN c.[id number], and TP53 c.97-6C>T, all are VUSs found on Myrisk  testing.  The [facility] gene panel offered by [employer] includes sequencing and deletion/duplication testing of the following 25 genes: APC, ATM, BARD1, BMPR1A, BRCA1, BRCA2, BRIP1, CHD1, CDK4, CDKN2A, CHEK2, EPCAM (large rearrangement only), MLH1, MSH2, MSH6, MUTYH, NBN, PALB2, PMS2, PTEN, RAD51C, RAD51D, SMAD4, STK11, and TP53.  The report date is February 13, 2013. -Her sister was diagnosed with Colon cancer 2 months ago and pt will set up a colonoscopy through her PCP for further screening.    Plan -Lab and f/u in 1 year -Mammogram 12/2017. Breast MRI 03/2018 for breast cancer screening    All questions were answered. The patient knows to call the clinic with any problems, questions or concerns. No barriers to learning was detected.  I spent 15 minutes counseling the patient face to face. The total time spent in the appointment was 20 minutes and more than 50% was on counseling and review of test results  This document serves as a record of services personally performed by Truitt Merle, MD. It was created on her behalf by Joslyn Devon, a trained medical scribe. The creation of this record is based on the scribe's personal observations and the provider's statements to them. This document has been checked and approved by the attending provider.   I have reviewed the above documentation for accuracy and completeness and I agree with the above.   Truitt Merle, MD

## 2017-07-31 NOTE — Patient Instructions (Signed)
Office will call you to arrange for surgery. 

## 2017-07-31 NOTE — Progress Notes (Signed)
    Tara Bell Nov 02, 1966 [id number]        51 y.o.  G2P2 presents for ultrasound. History of large leiomyoma, BRCA gene positive for unknown significance, history of breast cancer a history of heavy irregular menses. Ultrasound was ordered to rule out ovarian process preoperatively as patient is planning TAH/BSO.  Past medical history,surgical history, problem list, medications, allergies, family history and social history were all reviewed and documented in the EPIC chart.  Directed ROS with pertinent positives and negatives documented in the history of present illness/assessment and plan.  Exam: Vitals:   07/31/17 1545  BP: 118/76   General appearance:  Normal  Ultrasound transabdominal shows uterus enlarged with multiple myomas in various locations. Measured myomas include 56 mm, 55 mm, 46 mm, 46 mm, 38 mm, 35 mm. Large exophytic left myomas measuring 13.8 x 12 x 7.5 cm. Endometrial echo 4.1 mm. Right ovary not visualized but no right adnexal pathology. Left ovary with reticular pattern cyst 34 x 25 x 29 mm with negative color-flow.  Assessment/Plan:  51 y.o. G2P2 with large symptomatic myomas. No evidence of overt ovarian pathology. BRCA gene of unknown significance. Check baseline hemoglobin now and CA-125. Patient is in the process of arranging for TAH/BSO and will follow up preoperatively for this.    Anastasio Auerbach MD, 4:10 PM 07/31/2017

## 2017-08-01 LAB — CBC WITH DIFFERENTIAL/PLATELET
BASOS PCT: 0.2 %
Basophils Absolute: 17 cells/uL (ref 0–200)
EOS ABS: 51 {cells}/uL (ref 15–500)
Eosinophils Relative: 0.6 %
HCT: 38.2 % (ref 35.0–45.0)
HEMOGLOBIN: 12.9 g/dL (ref 11.7–15.5)
LYMPHS ABS: 1581 {cells}/uL (ref 850–3900)
MCH: 29.2 pg (ref 27.0–33.0)
MCHC: 33.8 g/dL (ref 32.0–36.0)
MCV: 86.4 fL (ref 80.0–100.0)
MONOS PCT: 7.3 %
MPV: 9.5 fL (ref 7.5–12.5)
NEUTROS ABS: 6231 {cells}/uL (ref 1500–7800)
Neutrophils Relative %: 73.3 %
Platelets: 280 10*3/uL (ref 140–400)
RBC: 4.42 10*6/uL (ref 3.80–5.10)
RDW: 14.8 % (ref 11.0–15.0)
Total Lymphocyte: 18.6 %
WBC: 8.5 10*3/uL (ref 3.8–10.8)
WBCMIX: 621 {cells}/uL (ref 200–950)

## 2017-08-01 LAB — CA 125: CA 125: 35 U/mL — ABNORMAL HIGH (ref <35)

## 2017-08-02 ENCOUNTER — Telehealth: Payer: Self-pay | Admitting: Hematology

## 2017-08-02 ENCOUNTER — Ambulatory Visit (HOSPITAL_BASED_OUTPATIENT_CLINIC_OR_DEPARTMENT_OTHER): Payer: 59 | Admitting: Hematology

## 2017-08-02 ENCOUNTER — Other Ambulatory Visit (HOSPITAL_BASED_OUTPATIENT_CLINIC_OR_DEPARTMENT_OTHER): Payer: 59

## 2017-08-02 ENCOUNTER — Encounter: Payer: Self-pay | Admitting: Hematology

## 2017-08-02 VITALS — BP 130/75 | HR 71 | Temp 98.3°F | Resp 18 | Ht 61.0 in | Wt 150.9 lb

## 2017-08-02 DIAGNOSIS — H04551 Acquired stenosis of right nasolacrimal duct: Secondary | ICD-10-CM | POA: Diagnosis not present

## 2017-08-02 DIAGNOSIS — I10 Essential (primary) hypertension: Secondary | ICD-10-CM | POA: Diagnosis not present

## 2017-08-02 DIAGNOSIS — D500 Iron deficiency anemia secondary to blood loss (chronic): Secondary | ICD-10-CM

## 2017-08-02 DIAGNOSIS — Z853 Personal history of malignant neoplasm of breast: Secondary | ICD-10-CM | POA: Diagnosis not present

## 2017-08-02 DIAGNOSIS — Z862 Personal history of diseases of the blood and blood-forming organs and certain disorders involving the immune mechanism: Secondary | ICD-10-CM | POA: Diagnosis not present

## 2017-08-02 DIAGNOSIS — Z171 Estrogen receptor negative status [ER-]: Principal | ICD-10-CM

## 2017-08-02 DIAGNOSIS — C50019 Malignant neoplasm of nipple and areola, unspecified female breast: Secondary | ICD-10-CM

## 2017-08-02 DIAGNOSIS — I890 Lymphedema, not elsewhere classified: Secondary | ICD-10-CM

## 2017-08-02 LAB — CBC WITH DIFFERENTIAL/PLATELET
BASO%: 0.2 % (ref 0.0–2.0)
Basophils Absolute: 0 10*3/uL (ref 0.0–0.1)
EOS%: 0.5 % (ref 0.0–7.0)
Eosinophils Absolute: 0 10*3/uL (ref 0.0–0.5)
HCT: 39.1 % (ref 34.8–46.6)
HEMOGLOBIN: 12.8 g/dL (ref 11.6–15.9)
LYMPH%: 18.9 % (ref 14.0–49.7)
MCH: 29.4 pg (ref 25.1–34.0)
MCHC: 32.9 g/dL (ref 31.5–36.0)
MCV: 89.5 fL (ref 79.5–101.0)
MONO#: 0.5 10*3/uL (ref 0.1–0.9)
MONO%: 6.8 % (ref 0.0–14.0)
NEUT%: 73.6 % (ref 38.4–76.8)
NEUTROS ABS: 5.3 10*3/uL (ref 1.5–6.5)
Platelets: 249 10*3/uL (ref 145–400)
RBC: 4.36 10*6/uL (ref 3.70–5.45)
RDW: 15.3 % — AB (ref 11.2–14.5)
WBC: 7.2 10*3/uL (ref 3.9–10.3)
lymph#: 1.4 10*3/uL (ref 0.9–3.3)

## 2017-08-02 LAB — COMPREHENSIVE METABOLIC PANEL
ALBUMIN: 3.7 g/dL (ref 3.5–5.0)
ALK PHOS: 67 U/L (ref 40–150)
ALT: 12 U/L (ref 0–55)
AST: 21 U/L (ref 5–34)
Anion Gap: 9 mEq/L (ref 3–11)
BILIRUBIN TOTAL: 0.43 mg/dL (ref 0.20–1.20)
BUN: 10.3 mg/dL (ref 7.0–26.0)
CO2: 27 meq/L (ref 22–29)
Calcium: 9.5 mg/dL (ref 8.4–10.4)
Chloride: 102 mEq/L (ref 98–109)
Creatinine: 0.8 mg/dL (ref 0.6–1.1)
EGFR: >90 mL/min/{1.73_m2} (ref >90)
GLUCOSE: 108 mg/dL (ref 70–140)
POTASSIUM: 3.5 meq/L (ref 3.5–5.1)
SODIUM: 139 meq/L (ref 136–145)
TOTAL PROTEIN: 7.9 g/dL (ref 6.4–8.3)

## 2017-08-02 NOTE — Telephone Encounter (Signed)
Gave avs and calendar for September 2019 °

## 2017-08-05 LAB — FERRITIN: FERRITIN: 38 ng/mL (ref 9–269)

## 2017-08-05 LAB — IRON AND TIBC
%SAT: 20 % — AB (ref 21–57)
IRON: 67 ug/dL (ref 41–142)
TIBC: 343 ug/dL (ref 236–444)
UIBC: 276 ug/dL (ref 120–384)

## 2017-08-09 ENCOUNTER — Telehealth: Payer: Self-pay

## 2017-08-09 NOTE — Telephone Encounter (Signed)
I called patient and left message that the Dec surgery schedule is now available and to call me to schedule.

## 2017-08-12 ENCOUNTER — Telehealth: Payer: Self-pay

## 2017-08-12 ENCOUNTER — Other Ambulatory Visit: Payer: Self-pay | Admitting: Gynecology

## 2017-08-12 DIAGNOSIS — N83202 Unspecified ovarian cyst, left side: Secondary | ICD-10-CM

## 2017-08-12 NOTE — Telephone Encounter (Signed)
Patient informed and order placed for u/s. Appt scheduled for December.

## 2017-08-12 NOTE — Telephone Encounter (Signed)
It returned 35 which is technically elevated with the upper limits of normal 34. She has been 40 and 37 in the past and I do not think this is of clinical significance. My plan was to repeat it in December when she comes in for her ultrasound after I see the results of the ultrasound.

## 2017-08-12 NOTE — Telephone Encounter (Signed)
I would recommend ultrasound to follow up on the small ovarian cyst that she had when we looked in September and she can schedule this in December.

## 2017-08-12 NOTE — Telephone Encounter (Signed)
Patient asked about her CA125 result. Said she never heard about it. What to advise?

## 2017-08-12 NOTE — Telephone Encounter (Signed)
Patient called me back. I had called her Friday to let her know the December surgery schedule was ready as she had previously indicated she wanted to schedule in Dec.  She called today to say that she would not be ready financially to proceed with surgery at that time and would need to wait until she was financially prepared.  Dr. Elvera Lennox special instructions note on the surgery slip said you wanted her to have U/s and CA125 and CBC done the before surgery. Just wanted to be sure that was not anything she needed to do now in light of postponing surgery.

## 2017-10-09 ENCOUNTER — Other Ambulatory Visit: Payer: Self-pay | Admitting: Hematology

## 2017-10-09 DIAGNOSIS — Z1231 Encounter for screening mammogram for malignant neoplasm of breast: Secondary | ICD-10-CM

## 2017-10-16 ENCOUNTER — Other Ambulatory Visit: Payer: 59

## 2017-10-16 ENCOUNTER — Ambulatory Visit: Payer: 59 | Admitting: Gynecology

## 2017-10-21 ENCOUNTER — Other Ambulatory Visit: Payer: Self-pay | Admitting: Gynecology

## 2017-10-21 ENCOUNTER — Encounter: Payer: Self-pay | Admitting: Gynecology

## 2017-10-21 ENCOUNTER — Ambulatory Visit (INDEPENDENT_AMBULATORY_CARE_PROVIDER_SITE_OTHER): Payer: 59 | Admitting: Gynecology

## 2017-10-21 ENCOUNTER — Ambulatory Visit (INDEPENDENT_AMBULATORY_CARE_PROVIDER_SITE_OTHER): Payer: 59

## 2017-10-21 VITALS — BP 114/70

## 2017-10-21 DIAGNOSIS — N83202 Unspecified ovarian cyst, left side: Secondary | ICD-10-CM

## 2017-10-21 DIAGNOSIS — D259 Leiomyoma of uterus, unspecified: Secondary | ICD-10-CM | POA: Diagnosis not present

## 2017-10-21 DIAGNOSIS — N852 Hypertrophy of uterus: Secondary | ICD-10-CM

## 2017-10-21 DIAGNOSIS — D252 Subserosal leiomyoma of uterus: Secondary | ICD-10-CM | POA: Diagnosis not present

## 2017-10-21 DIAGNOSIS — D251 Intramural leiomyoma of uterus: Secondary | ICD-10-CM | POA: Diagnosis not present

## 2017-10-21 NOTE — Patient Instructions (Signed)
Office will call you to arrange surgery. 

## 2017-10-21 NOTE — Progress Notes (Signed)
    Tara Bell Oct 24, 1966 [id number]        51 y.o.  G2P2 presents for ultrasound.  History of heavy menses with leiomyoma.  History of breast cancer with BRCA gene positive for unknown significance.  Is planning to have hysterectomy.  Cystic area on left ovary noted on previous ultrasound and is here for repeat ultrasound.  CA 125 returned 35.  Has been up to 40 in the past and down to the 20 range.  Past medical history,surgical history, problem list, medications, allergies, family history and social history were all reviewed and documented in the EPIC chart.  Directed ROS with pertinent positives and negatives documented in the history of present illness/assessment and plan.  Exam: Vitals:   10/21/17 1013  BP: 114/70   General appearance:  Normal Abdomen soft nontender with uterus palpated at the umbilicus.  Ultrasound shows uterus enlarged with multiple myomas without significant change from prior ultrasound.  Measured myomas include 13 cm left exophytic, 54 mm and 46 mm.  Right and left ovaries visualized and normal.  Previous left ovarian cyst not visualized.  Cul-de-sac negative.  Assessment/Plan:  51 y.o. G2P2 with leiomyoma, enlarged uterus which is symptomatic to her from a pressure/discomfort standpoint.  Also having irregular heavy menses.  CA 125 was upper limits of normal at 35.  Has been over 40 in the past.  I do not consider this a significant elevation and this was discussed with her and she is comfortable with not repeating it.  She wants to proceed with hysterectomy.  The pros and cons of oophorectomy was also discussed with her.  She does have a history of breast cancer and a positive BRCA gene of unknown significance.  We both agree to move towards TAH/BSO.  The issues of hypoestrogenic symptoms afterwards to include significant hot flushes and sweats discussed.  Tentative treatment options to include Effexor or other pharmacologic nonhormonal medications discussed.   Patient is comfortable with the BSO aspect of the surgery.  Will schedule surgery and she will follow-up for preoperative consult before hand.  Greater than 50% of my time was spent in direct face to face counseling and coordination of care with the patient.     Anastasio Auerbach MD, 10:56 AM 10/21/2017

## 2017-10-22 ENCOUNTER — Telehealth: Payer: Self-pay

## 2017-10-22 NOTE — Telephone Encounter (Signed)
I spoke with patient regarding scheduling TAH,BSO. We reviewed her current ins benefits and estimated surgery prepymt based on current insurance. She said her insurance will be changing to [place] with the first of the year. She did not want to schedule now based on what her prepymt will be with [facility].  She will wait and get her new insurance information to me and I will recheck ins benefits with the first of the year.  I did remind her to continue her oral iron supplement daily preoperatively.

## 2017-11-27 ENCOUNTER — Ambulatory Visit (INDEPENDENT_AMBULATORY_CARE_PROVIDER_SITE_OTHER): Payer: Managed Care, Other (non HMO) | Admitting: Women's Health

## 2017-11-27 ENCOUNTER — Encounter: Payer: Self-pay | Admitting: Women's Health

## 2017-11-27 VITALS — BP 120/82 | Ht 61.0 in | Wt 147.0 lb

## 2017-11-27 DIAGNOSIS — Z30432 Encounter for removal of intrauterine contraceptive device: Secondary | ICD-10-CM

## 2017-11-27 DIAGNOSIS — D250 Submucous leiomyoma of uterus: Secondary | ICD-10-CM

## 2017-11-27 DIAGNOSIS — Z01419 Encounter for gynecological examination (general) (routine) without abnormal findings: Secondary | ICD-10-CM

## 2017-11-27 MED ORDER — VALACYCLOVIR HCL 500 MG PO TABS: 500.0000 mg | ORAL_TABLET | Freq: Two times a day (BID) | ORAL | 12 refills | Status: DC | PRN

## 2017-11-27 NOTE — Progress Notes (Signed)
Tara Bell 12-Jun-1966 [id number]    History: 52 yo SBF G2 P2 presents for annual exam. Sexually active/same partner/years. Denies abdominal pain, urinary incontinence, or fever. History of heavy menstrual bleeding, LMP 08/2017. Left breast cancer diagnosed at age 23, triple negative, lumpectomy, radiation, chemotherapy. Bilateral saline breast implants. BRCA negative. Mild lymphedema in left arm. History of HSV with rare outbreaks. History of uterine leiomyomas, largest is 13 cm. Cleared for hysterectomy, but waiting until able to afford the procedure. 09/13/2017 negative colonoscopy. 51 year old sister diagnosed with colon cancer this past year. Currently receiving chemotherapy.  Past medical history, past surgical history, family history and social history were all reviewed and documented in the EPIC chart. Works in Orthoptist at [employer], Network engineer job. 2 grown daughters, engaged. Errands hypertension, father deceased prostate cancer.  ROS:  A ROS was performed and pertinent positives and negatives are included.  Exam: Appears well.   Vitals:   11/27/17 1557  BP: 120/82  Weight: 147 lb (66.7 kg)  Height: '5\' 1"'$  (1.549 m)   Body mass index is 27.78 kg/m.   General appearance:  Normal Thyroid:  Symmetrical, normal in size, without palpable masses or nodularity. Respiratory  Auscultation:  Clear without wheezing or rhonchi Cardiovascular  Auscultation:  Regular rate, without rubs, murmurs or gallops  Edema/varicosities:  Not grossly evident Abdominal  Soft,nontender, without masses, guarding or rebound.  Liver/spleen:  No organomegaly noted  Hernia:  None appreciated  Skin  Inspection:  Grossly normal   Breasts: Examined lying and sitting. Well-healed lumpectomy scar, lymph node resection, and Port-A-Cath site all well-healed.    Right: Without masses, retractions, discharge or axillary adenopathy. Saline breast implant     Left: Without masses, retractions, discharge or axillary  adenopathy. Saline breast implant.  Gentitourinary   Inguinal/mons:  Normal without inguinal adenopathy  External genitalia:  Normal  BUS/Urethra/Skene's glands:  Normal  Vagina:  Normal  Cervix:  Normal  Uterus:  Large greater than 20 weeks' size nontender .  Midline and mobile  Adnexa/parametria:     Rt: Without masses or tenderness.   Lt: Without masses or tenderness.  Anus and perineum: Normal  Digital rectal exam: Normal sphincter tone without palpated masses or tenderness  Assessment/Plan:  52 y.o. SBF G2P2  for annual exam with no complaints.  PeriMenopausal last cycle October 2018. Uterine Fibroids, largest 13 cm/ovarian cysts with mildly elevated CA 125 BRCA 2 indeterminate/unknown significance Left Breast Cancer at 52 yo/Bilateral saline breast implants Left arm lymphedema HSV-2 with rare outbreaks Hypertension-primary care manages labs and meds  Plan: Counseled to schedule TVH with BSO as soon as she is able. Planning to schedule after tax refund,. SBE's, continue yearly screening mammograms. Advised to wear compression sleeve at night for left arm lymphedema. Encouraged regular exercise, calcium rich foods, vitamin D 2000 daily. Will keep menstrual record. Call if further bleeding. Intends iron supplement daily and iron rich foods and diet.    [place], 4:23 PM 11/27/2017

## 2017-11-27 NOTE — Patient Instructions (Signed)
Hysterectomy Information A hysterectomy is a surgery to remove your uterus. After surgery, you will no longer have periods. Also, you will not be able to get pregnant. Reasons for this surgery  You have bleeding that is not normal and keeps coming back.  You have lasting (chronic) lower belly (pelvic) pain.  You have a lasting infection.  The lining of your uterus grows outside your uterus.  The lining of your uterus grows in the muscle of your uterus.  Your uterus falls down into your vagina.  You have a growth in your uterus that causes problems.  You have cells that could turn into cancer (precancerous cells).  You have cancer of the uterus or cervix. Types There are 3 types of hysterectomies. Depending on the type, the surgery will:  Remove the top part of the uterus only.  Remove the uterus and the cervix.  Remove the uterus, cervix, and tissue that holds the uterus in place in the lower belly.  Ways a hysterectomy can be performed There are 5 ways this surgery can be performed.  A cut (incision) is made in the belly (abdomen). The uterus is taken out through the cut.  A cut is made in the vagina. The uterus is taken out through the cut.  Three or four cuts are made in the belly. A surgical device with a camera is put through one of the cuts. The uterus is cut into small pieces. The uterus is taken out through the cuts or the vagina.  Three or four cuts are made in the belly. A surgical device with a camera is put through one of the cuts. The uterus is taken out through the vagina.  Three or four cuts are made in the belly. A surgical device that is controlled by a computer makes a visual image. The device helps the surgeon control the surgical tools. The uterus is cut into small pieces. The pieces are taken out through the cuts or through the vagina.  What can I expect after the surgery?  You will be given pain medicine.  You will need help at home for 3-5 days  after surgery.  You will need to see your doctor in 2-4 weeks after surgery.  You may get hot flashes, have night sweats, and have trouble sleeping.  You may need to have Pap tests in the future if your surgery was related to cancer. Talk to your doctor. It is still good to have regular exams. This information is not intended to replace advice given to you by your health care provider. Make sure you discuss any questions you have with your health care provider. Document Released: 01/14/2012 Document Revised: 03/29/2016 Document Reviewed: 06/29/2013 Elsevier Interactive Patient Education  2018 [employer] Maintenance for Postmenopausal Women Menopause is a normal process in which your reproductive ability comes to an end. This process happens gradually over a span of months to years, usually between the ages of 38 and 70. Menopause is complete when you have missed 12 consecutive menstrual periods. It is important to talk with your health care provider about some of the most common conditions that affect postmenopausal women, such as heart disease, cancer, and bone loss (osteoporosis). Adopting a healthy lifestyle and getting preventive care can help to promote your health and wellness. Those actions can also lower your chances of developing some of these common conditions. What should I know about menopause? During menopause, you may experience a number of symptoms, such as:  Moderate-to-severe hot  flashes.  Night sweats.  Decrease in sex drive.  Mood swings.  Headaches.  Tiredness.  Irritability.  Memory problems.  Insomnia.  Choosing to treat or not to treat menopausal changes is an individual decision that you make with your health care provider. What should I know about hormone replacement therapy and supplements? Hormone therapy products are effective for treating symptoms that are associated with menopause, such as hot flashes and night sweats. Hormone replacement  carries certain risks, especially as you become older. If you are thinking about using estrogen or estrogen with progestin treatments, discuss the benefits and risks with your health care provider. What should I know about heart disease and stroke? Heart disease, heart attack, and stroke become more likely as you age. This may be due, in part, to the hormonal changes that your body experiences during menopause. These can affect how your body processes dietary fats, triglycerides, and cholesterol. Heart attack and stroke are both medical emergencies. There are many things that you can do to help prevent heart disease and stroke:  Have your blood pressure checked at least every 1-2 years. High blood pressure causes heart disease and increases the risk of stroke.  If you are 60-61 years old, ask your health care provider if you should take aspirin to prevent a heart attack or a stroke.  Do not use any tobacco products, including cigarettes, chewing tobacco, or electronic cigarettes. If you need help quitting, ask your health care provider.  It is important to eat a healthy diet and maintain a healthy weight. ? Be sure to include plenty of vegetables, fruits, low-fat dairy products, and lean protein. ? Avoid eating foods that are high in solid fats, added sugars, or salt (sodium).  Get regular exercise. This is one of the most important things that you can do for your health. ? Try to exercise for at least 150 minutes each week. The type of exercise that you do should increase your heart rate and make you sweat. This is known as moderate-intensity exercise. ? Try to do strengthening exercises at least twice each week. Do these in addition to the moderate-intensity exercise.  Know your numbers.Ask your health care provider to check your cholesterol and your blood glucose. Continue to have your blood tested as directed by your health care provider.  What should I know about cancer screening? There  are several types of cancer. Take the following steps to reduce your risk and to catch any cancer development as early as possible. Breast Cancer  Practice breast self-awareness. ? This means understanding how your breasts normally appear and feel. ? It also means doing regular breast self-exams. Let your health care provider know about any changes, no matter how small.  If you are 37 or older, have a clinician do a breast exam (clinical breast exam or CBE) every year. Depending on your age, family history, and medical history, it may be recommended that you also have a yearly breast X-ray (mammogram).  If you have a family history of breast cancer, talk with your health care provider about genetic screening.  If you are at high risk for breast cancer, talk with your health care provider about having an MRI and a mammogram every year.  Breast cancer (BRCA) gene test is recommended for women who have family members with BRCA-related cancers. Results of the assessment will determine the need for genetic counseling and BRCA1 and for BRCA2 testing. BRCA-related cancers include these types: ? Breast. This occurs in males or  females. ? Ovarian. ? Tubal. This may also be called fallopian tube cancer. ? Cancer of the abdominal or pelvic lining (peritoneal cancer). ? Prostate. ? Pancreatic.  Cervical, Uterine, and Ovarian Cancer Your health care provider may recommend that you be screened regularly for cancer of the pelvic organs. These include your ovaries, uterus, and vagina. This screening involves a pelvic exam, which includes checking for microscopic changes to the surface of your cervix (Pap test).  For women ages 21-65, health care providers may recommend a pelvic exam and a Pap test every three years. For women ages 57-65, they may recommend the Pap test and pelvic exam, combined with testing for human papilloma virus (HPV), every five years. Some types of HPV increase your risk of cervical  cancer. Testing for HPV may also be done on women of any age who have unclear Pap test results.  Other health care providers may not recommend any screening for nonpregnant women who are considered low risk for pelvic cancer and have no symptoms. Ask your health care provider if a screening pelvic exam is right for you.  If you have had past treatment for cervical cancer or a condition that could lead to cancer, you need Pap tests and screening for cancer for at least 20 years after your treatment. If Pap tests have been discontinued for you, your risk factors (such as having a new sexual partner) need to be reassessed to determine if you should start having screenings again. Some women have medical problems that increase the chance of getting cervical cancer. In these cases, your health care provider may recommend that you have screening and Pap tests more often.  If you have a family history of uterine cancer or ovarian cancer, talk with your health care provider about genetic screening.  If you have vaginal bleeding after reaching menopause, tell your health care provider.  There are currently no reliable tests available to screen for ovarian cancer.  Lung Cancer Lung cancer screening is recommended for adults 50-62 years old who are at high risk for lung cancer because of a history of smoking. A yearly low-dose CT scan of the lungs is recommended if you:  Currently smoke.  Have a history of at least 30 pack-years of smoking and you currently smoke or have quit within the past 15 years. A pack-year is smoking an average of one pack of cigarettes per day for one year.  Yearly screening should:  Continue until it has been 15 years since you quit.  Stop if you develop a health problem that would prevent you from having lung cancer treatment.  Colorectal Cancer  This type of cancer can be detected and can often be prevented.  Routine colorectal cancer screening usually begins at age 20  and continues through age 26.  If you have risk factors for colon cancer, your health care provider may recommend that you be screened at an earlier age.  If you have a family history of colorectal cancer, talk with your health care provider about genetic screening.  Your health care provider may also recommend using home test kits to check for hidden blood in your stool.  A small camera at the end of a tube can be used to examine your colon directly (sigmoidoscopy or colonoscopy). This is done to check for the earliest forms of colorectal cancer.  Direct examination of the colon should be repeated every 5-10 years until age 71. However, if early forms of precancerous polyps or small growths are  found or if you have a family history or genetic risk for colorectal cancer, you may need to be screened more often.  Skin Cancer  Check your skin from head to toe regularly.  Monitor any moles. Be sure to tell your health care provider: ? About any new moles or changes in moles, especially if there is a change in a mole's shape or color. ? If you have a mole that is larger than the size of a pencil eraser.  If any of your family members has a history of skin cancer, especially at a Murriel Holwerda age, talk with your health care provider about genetic screening.  Always use sunscreen. Apply sunscreen liberally and repeatedly throughout the day.  Whenever you are outside, protect yourself by wearing long sleeves, pants, a wide-brimmed hat, and sunglasses.  What should I know about osteoporosis? Osteoporosis is a condition in which bone destruction happens more quickly than new bone creation. After menopause, you may be at an increased risk for osteoporosis. To help prevent osteoporosis or the bone fractures that can happen because of osteoporosis, the following is recommended:  If you are 71-11 years old, get at least 1,000 mg of calcium and at least 600 mg of vitamin D per day.  If you are older than  age 15 but younger than age 46, get at least 1,200 mg of calcium and at least 600 mg of vitamin D per day.  If you are older than age 56, get at least 1,200 mg of calcium and at least 800 mg of vitamin D per day.  Smoking and excessive alcohol intake increase the risk of osteoporosis. Eat foods that are rich in calcium and vitamin D, and do weight-bearing exercises several times each week as directed by your health care provider. What should I know about how menopause affects my mental health? Depression may occur at any age, but it is more common as you become older. Common symptoms of depression include:  Low or sad mood.  Changes in sleep patterns.  Changes in appetite or eating patterns.  Feeling an overall lack of motivation or enjoyment of activities that you previously enjoyed.  Frequent crying spells.  Talk with your health care provider if you think that you are experiencing depression. What should I know about immunizations? It is important that you get and maintain your immunizations. These include:  Tetanus, diphtheria, and pertussis (Tdap) booster vaccine.  Influenza every year before the flu season begins.  Pneumonia vaccine.  Shingles vaccine.  Your health care provider may also recommend other immunizations. This information is not intended to replace advice given to you by your health care provider. Make sure you discuss any questions you have with your health care provider. Document Released: 12/14/2005 Document Revised: 05/11/2016 Document Reviewed: 07/26/2015 Elsevier Interactive Patient Education  2018 [employer].

## 2017-12-23 ENCOUNTER — Ambulatory Visit
Admission: RE | Admit: 2017-12-23 | Discharge: 2017-12-23 | Disposition: A | Discharge status: Home / Self Care | Payer: 59 | Source: Ambulatory Visit | Attending: Hematology | Admitting: Hematology

## 2017-12-23 DIAGNOSIS — Z1231 Encounter for screening mammogram for malignant neoplasm of breast: Secondary | ICD-10-CM

## 2018-01-01 ENCOUNTER — Other Ambulatory Visit: Payer: Managed Care, Other (non HMO)

## 2018-01-08 ENCOUNTER — Ambulatory Visit
Admission: RE | Admit: 2018-01-08 | Discharge: 2018-01-08 | Disposition: A | Discharge status: Home / Self Care | Payer: Managed Care, Other (non HMO) | Source: Ambulatory Visit | Attending: Hematology | Admitting: Hematology

## 2018-01-08 DIAGNOSIS — Z853 Personal history of malignant neoplasm of breast: Secondary | ICD-10-CM

## 2018-01-08 MED ORDER — GADOBENATE DIMEGLUMINE 529 MG/ML IV SOLN
14.0000 mL | Freq: Once | INTRAVENOUS | Status: AC | PRN
  Administered 2018-01-08: 14 mL via INTRAVENOUS

## 2018-07-28 ENCOUNTER — Encounter: Payer: Self-pay | Admitting: Women's Health

## 2018-07-28 ENCOUNTER — Ambulatory Visit: Payer: Managed Care, Other (non HMO) | Admitting: Women's Health

## 2018-07-28 ENCOUNTER — Telehealth: Payer: Self-pay

## 2018-07-28 VITALS — BP 118/80

## 2018-07-28 DIAGNOSIS — N898 Other specified noninflammatory disorders of vagina: Secondary | ICD-10-CM

## 2018-07-28 DIAGNOSIS — B9689 Other specified bacterial agents as the cause of diseases classified elsewhere: Secondary | ICD-10-CM | POA: Diagnosis not present

## 2018-07-28 DIAGNOSIS — R350 Frequency of micturition: Secondary | ICD-10-CM | POA: Diagnosis not present

## 2018-07-28 DIAGNOSIS — N760 Acute vaginitis: Secondary | ICD-10-CM | POA: Diagnosis not present

## 2018-07-28 LAB — WET PREP FOR TRICH, YEAST, CLUE

## 2018-07-28 MED ORDER — METRONIDAZOLE 500 MG PO TABS: 500.0000 mg | ORAL_TABLET | Freq: Two times a day (BID) | ORAL | 0 refills | Status: DC

## 2018-07-28 NOTE — Telephone Encounter (Signed)
Patient called to request appointment. Date change per 9/23 phone que

## 2018-07-28 NOTE — Progress Notes (Signed)
52 year old S BF G2, P2 presents with complaint of intermittent low abdominal discomfort, vaginal discharge with itching with irritation and mild urinary frequency for the past week.  Same partner questionable infidelity.  History of a large 13 cm fibroid.  Significant history left breast cancer age 11 BRCA 2 1 marker indeterminant, BRCA1 negative.  36 year old sister died last month of metastatic colon cancer in hospice who had a 63-year-old son.  Was planning to schedule TAH with [facility] but states had to take a lot of time off with sister and her illness and can not afford this year.  Medical problems include hypertension, PC manages.  2 Daughters both doing well.   Sam: Appears well.  No CVAT.  External genitalia within normal limits, speculum exam moderate amount of white adherent discharge noted, wet prep positive for clues, TNTC bacteria.  GC/Chlamydia culture taken.  Bimanual uterus palpates 22 weeks size no CMT. UA: +2 blood, +1 leukocytes, negative nitrites, 10-20 WBCs, 3-10 RBCs, 10-20 squamous epithelials, moderate bacteria  Bacterial vaginosis Large fibroid uterus Indeterminate marker on BRCA1 with history of breast cancer at age 21  Plan: Flagyl 500 twice daily for 7 days, alcohol precautions reviewed, call if no relief of symptoms or if abdominal pain persists  - will check ultrasound.  Instructed to call if any further bleeding.  Discussed TAH with [facility], will schedule appointment with Dr. Phineas Real when able to schedule.  Condolences given regarding sister.  Urine Culture pending.

## 2018-07-28 NOTE — Patient Instructions (Signed)
Bacterial Vaginosis Bacterial vaginosis is a vaginal infection that occurs when the normal balance of bacteria in the vagina is disrupted. It results from an overgrowth of certain bacteria. This is the most common vaginal infection among women ages 15-44. Because bacterial vaginosis increases your risk for STIs (sexually transmitted infections), getting treated can help reduce your risk for chlamydia, gonorrhea, herpes, and HIV (human immunodeficiency virus). Treatment is also important for preventing complications in pregnant women, because this condition can cause an early (premature) delivery. What are the causes? This condition is caused by an increase in harmful bacteria that are normally present in small amounts in the vagina. However, the reason that the condition develops is not fully understood. What increases the risk? The following factors may make you more likely to develop this condition:  Having a new sexual partner or multiple sexual partners.  Having unprotected sex.  Douching.  Having an intrauterine device (IUD).  Smoking.  Drug and alcohol abuse.  Taking certain antibiotic medicines.  Being pregnant.  You cannot get bacterial vaginosis from toilet seats, bedding, swimming pools, or contact with objects around you. What are the signs or symptoms? Symptoms of this condition include:  Grey or white vaginal discharge. The discharge can also be watery or foamy.  A fish-like odor with discharge, especially after sexual intercourse or during menstruation.  Itching in and around the vagina.  Burning or pain with urination.  Some women with bacterial vaginosis have no signs or symptoms. How is this diagnosed? This condition is diagnosed based on:  Your medical history.  A physical exam of the vagina.  Testing a sample of vaginal fluid under a microscope to look for a large amount of bad bacteria or abnormal cells. Your health care provider may use a cotton swab  or a small wooden spatula to collect the sample.  How is this treated? This condition is treated with antibiotics. These may be given as a pill, a vaginal cream, or a medicine that is put into the vagina (suppository). If the condition comes back after treatment, a second round of antibiotics may be needed. Follow these instructions at home: Medicines  Take over-the-counter and prescription medicines only as told by your health care provider.  Take or use your antibiotic as told by your health care provider. Do not stop taking or using the antibiotic even if you start to feel better. General instructions  If you have a female sexual partner, tell her that you have a vaginal infection. She should see her health care provider and be treated if she has symptoms. If you have a female sexual partner, he does not need treatment.  During treatment: ? Avoid sexual activity until you finish treatment. ? Do not douche. ? Avoid alcohol as directed by your health care provider. ? Avoid breastfeeding as directed by your health care provider.  Drink enough water and fluids to keep your urine clear or pale yellow.  Keep the area around your vagina and rectum clean. ? Wash the area daily with warm water. ? Wipe yourself from front to back after using the toilet.  Keep all follow-up visits as told by your health care provider. This is important. How is this prevented?  Do not douche.  Wash the outside of your vagina with warm water only.  Use protection when having sex. This includes latex condoms and dental dams.  Limit how many sexual partners you have. To help prevent bacterial vaginosis, it is best to have sex with just   one partner (monogamous).  Make sure you and your sexual partner are tested for STIs.  Wear cotton or cotton-lined underwear.  Avoid wearing tight pants and pantyhose, especially during summer.  Limit the amount of alcohol that you drink.  Do not use any products that  contain nicotine or tobacco, such as cigarettes and e-cigarettes. If you need help quitting, ask your health care provider.  Do not use illegal drugs. Where to find more information:  Centers for Disease Control and Prevention: www.cdc.gov/std  American Sexual Health Association (ASHA): www.ashastd.org  [place] Department of Health and Human Services, Office on Women's Health: www.womenshealth.gov/ or https://www.womenshealth.gov/a-z-topics/bacterial-vaginosis Contact a health care provider if:  Your symptoms do not improve, even after treatment.  You have more discharge or pain when urinating.  You have a fever.  You have pain in your abdomen.  You have pain during sex.  You have vaginal bleeding between periods. Summary  Bacterial vaginosis is a vaginal infection that occurs when the normal balance of bacteria in the vagina is disrupted.  Because bacterial vaginosis increases your risk for STIs (sexually transmitted infections), getting treated can help reduce your risk for chlamydia, gonorrhea, herpes, and HIV (human immunodeficiency virus). Treatment is also important for preventing complications in pregnant women, because the condition can cause an early (premature) delivery.  This condition is treated with antibiotic medicines. These may be given as a pill, a vaginal cream, or a medicine that is put into the vagina (suppository). This information is not intended to replace advice given to you by your health care provider. Make sure you discuss any questions you have with your health care provider. Document Released: 10/22/2005 Document Revised: 02/25/2017 Document Reviewed: 07/07/2016 Elsevier Interactive Patient Education  2018 [employer]  

## 2018-07-28 NOTE — Progress Notes (Signed)
..  lb

## 2018-07-29 LAB — C. TRACHOMATIS/N. GONORRHOEAE RNA
C. TRACHOMATIS RNA, TMA: NOT DETECTED
N. GONORRHOEAE RNA, TMA: NOT DETECTED

## 2018-07-30 LAB — URINE CULTURE
MICRO NUMBER:: 91141563
SPECIMEN QUALITY: ADEQUATE

## 2018-08-01 ENCOUNTER — Inpatient Hospital Stay: Payer: Managed Care, Other (non HMO) | Admitting: Hematology

## 2018-08-01 ENCOUNTER — Inpatient Hospital Stay: Payer: Managed Care, Other (non HMO)

## 2018-08-05 NOTE — Progress Notes (Signed)
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 The tumor was triple negative (ER/PR negative and HER 2 1+ ,CISH not done in 2002), treated with left partial mastectomy with axillary node evaluation by Dr.Streck, adjuvant chemotherapy with adriamycin/cytoxan x 4 followed by taxotere weekly x 12 by Dr. Starr Sinclair, radiation to breast/axilla/supraclavicular region by Dr.Kinard, and since then on observation without known active disease. 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 Last bilateral mammograms were at [facility] 10-28-12 with breast tissue still extremely dense but no mammographic findings of concern; she has submuscular saline implants bilaterally. Last breast MRI was 11-14-11. With the BRCA 2 abnormality (tho VUS),  extremely dense breast tissue and this history,  yearly MRI is felt medically necessary    History of left breast cancer   07/06/2001 Initial Diagnosis    Breast cancer ([place])    12/24/2017 Mammogram    12/24/2017 Mammogram IMPRESSION: No mammographic evidence of malignancy. A result letter of this screening mammogram will be mailed directly to the patient.     01/09/2018 Breast MRI    01/09/2018 MRI Breast IMPRESSION: No MRI evidence of malignancy. Previously biopsied fibroadenoma in the right breast.      HISTORY OF PRESENT ILLNESS (From Dr. Mariana Kaufman note on 07/16/2016): Patient is seen, alone for  visit, in scheduled follow up of premenopausal triple negative 9 node + left breast cancer, this diagnosed 2002 at age 52. She has had no known recurrent disease since left partial mastectomy and adjuvant chemo and radiation completed in 2003. She has BRCA 2 VUS.  Last mammograms were bilateral 11-22-15 with bilateral breast MRI 11-23-15, then right breast biopsy 12-2015 showing fibroadenoma.    In past year she had heavy and irregular vaginal bleeding, with slight elevation of CA 125 in 05-2015 which was back in normal range 06-2015. She was treated for iron deficiency anemia related to heavy gyn blood loss. She had sonohystogram and pelvic US 12-2015 with apparent large uterine fibroid. Dr Toney Rakes recommended hysterectomy BSO, which patient has not yet scheduled. She tells me that she plans to have this done. Menstrual bleeding has been much lighter for past several months, and hemoglobin is back up to 13 today. I do NOT find that she had adjuvant tamoxifen, note triple negative breast CA.   She has slight lymphedema LUE related to breast cancer treatment. This has been more bothersome recently, with soreness around left elbow. 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 She has not worn compression sleeve in several years (?). She is willing to be referred back to lymphedema PT for evaluation and treatment, hopefully to include another compression garment.   She has otherwise felt well, with no recent infectious illness, no new or different pain otherwise, no changes in either breast that she can tell. Appetite and energy are good, no GI symptoms, no other bleeding, no respiratory symptoms, no swelling LE.  Excessive lacrimation since taxotere unchanged, moreso right than left  eye; she does not want reevaluation by ophthalmology, as stents were difficult to tolerate previously.    CURRENT THERAPY: Surveillence   INTERVAL HISTORY:  Tara Bell returns for follow up of her left breast  cancer. She was last seen by me last year. Since our last visit, she had a breast mammogram and MRI, and colonoscopy, that were all normal. She has also been following up with OB/GYN lately. Today, she is here alone at the clinic. She is doing well and denies new symptoms. Her menses are less frequent now and her LMP was a while ago, she cannot remember.  She sometimes experiences shooting pain when she hits her breast, but it resolves spontaneously. She can more her ULs without limitations.   REVIEW OF SYSTEMS:   Constitutional: Denies fevers, chills or abnormal weight loss Eyes: Denies blurriness of vision Ears, nose, mouth, throat, and face: Denies mucositis or sore throat Respiratory: Denies cough, dyspnea or wheezes Cardiovascular: Denies palpitation, chest discomfort or lower extremity swelling Gastrointestinal:  Denies nausea, heartburn or change in bowel habits Skin: Denies abnormal skin rashes Lymphatics: Denies new lymphadenopathy or easy bruising   Neurological:Denies numbness, tingling or new weaknesses Behavioral/Psych: Mood is stable, no new changes  Breasts: (+) occasional shooting pain when she hits her breast All other systems were reviewed with the patient and are negative.  MEDICAL HISTORY:  Past Medical History:  Diagnosis Date  . BRCA1 negative 08/2011  . 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 Breast cancer ([place]) SEPTEMBER 2002   LEFT BREAST-SURGERY,CHEMO,RADIATION-DR.LIVESAY  . Hypertension   . 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 Lymphedema of arm    DUE TO LEFT   AXILLARY NODE DISSECTION   AND RADIATION  . Migraines   . S/P cardiac catheterization, with normal coronary arteries and normal EF, 07/01/13 07/13/2013    SURGICAL HISTORY: Past Surgical History:  Procedure Laterality Date  . AUGMENTATION MAMMAPLASTY  2008  . BILATERAL TUBAL    . BREAST BIOPSY    . BREAST LUMPECTOMY Left 2002   AE 52  . CARDIAC  CATHETERIZATION  07/01/13   Angiographically normal coronary arteries with normal left atrial function  . HYSTEROSCOPY  12/10/07   RESECTOSCOPIC POLYPECTOMY/MYOMECTOMY  . LEFT HEART CATHETERIZATION WITH CORONARY ANGIOGRAM N/A 07/01/2013   Procedure: LEFT HEART CATHETERIZATION WITH CORONARY ANGIOGRAM;  Surgeon: Leonie Man, MD;  [facility] C[facility] LAB;  Service: Cardiovascular;  Laterality: N/A;  . MASTECTOMY, PARTIAL  07/23/2001   LEFT BREAST WITH AXILLARY NODE EVALUATION  . TUBAL LIGATION     BILATERAL    I have reviewed the social history and family history with the patient and they are unchanged from previous note.  ALLERGIES:  is allergic to nitrofurantoin monohyd macro.  MEDICATIONS:  Current Outpatient Medications  Medication Sig Dispense Refill  . Ferrous Fumarate (FERROCITE) 324 (106 Fe) MG TABS tablet Take 1 tablet (106 mg of iron total) by mouth daily. Take one tablet by mouth 5 times /week 25 tablet 3  . ibuprofen (ADVIL,MOTRIN) 600 MG tablet Take 1 tablet (600 mg total) by mouth every 8 (eight) hours as needed. 60 tablet 3  . metoprolol tartrate (LOPRESSOR) 25 MG tablet Take 0.5 tablets (12.5 mg total) by mouth 2 (two) times daily. 30 tablet 11  . metroNIDAZOLE (FLAGYL) 500 MG tablet Take 1 tablet (500 mg total) by mouth 2 (two) times daily. 14 tablet 0  . TRIBENZOR 20-5-12.5 MG TABS 1  tablet daily.     . valACYclovir (VALTREX) 500 MG tablet Take 1 tablet (500 mg total) by mouth 2 (two) times daily as needed. Reported on 11/03/2015 30 tablet 12   No current facility-administered medications for this visit.     PHYSICAL EXAMINATION: ECOG PERFORMANCE STATUS: 0 - Asymptomatic  Vitals:   08/07/18 1133  BP: 109/75  Pulse: 70  Resp: 17  Temp: 98 F (36.7 C)  SpO2: 99%   Filed Weights   08/07/18 1133  Weight: 138 lb 12.8 oz (63 kg)     GENERAL:alert, no distress and comfortable SKIN: skin color, texture, turgor are normal, no rashes or significant lesions EYES:  normal, Conjunctiva are pink and non-injected, sclera clear OROPHARYNX:no exudate, no erythema and lips, buccal mucosa, and tongue normal  NECK: supple, thyroid normal size, non-tender, without nodularity LYMPH:  no palpable lymphadenopathy in the cervical, axillary or inguinal LUNGS: clear to auscultation and percussion with normal breathing effort HEART: regular rate & rhythm and no murmurs and no lower extremity edema ABDOMEN:abdomen soft, non-tender and normal bowel sounds Musculoskeletal:no cyanosis of digits and no clubbing  NEURO: alert & oriented x 3 with fluent speech, no focal motor/sensory deficits Breasts: s/p left partial mastectomy with b/l implant. Palpation of the breasts and axilla revealed no obvious mass or adenopathy that I could appreciate.  LABORATORY DATA:  I have reviewed the data as listed CBC Latest Ref Rng & Units 08/07/2018 08/02/2017 07/31/2017  WBC 3.9 - 10.3 K/uL 6.6 7.2 8.5  Hemoglobin 11.6 - 15.9 g/dL 12.0 12.8 12.9  Hematocrit 34.8 - 46.6 % 36.1 39.1 38.2  Platelets 145 - 400 K/uL 282 249 280   CMP Latest Ref Rng & Units 08/07/2018 08/02/2017 01/28/2017  Glucose 70 - 99 mg/dL 91 108 82  BUN 6 - 20 mg/dL 14 10.3 11.7  Creatinine 0.44 - 1.00 mg/dL 0.88 0.8 0.8  Sodium 135 - 145 mmol/L 142 139 139  Potassium 3.5 - 5.1 mmol/L 3.6 3.5 3.8  Chloride 98 - 111 mmol/L 105 - -  CO2 22 - 32 mmol/L 28 27 26   Calcium 8.9 - 10.3 mg/dL 9.6 9.5 9.7  Total Protein 6.5 - 8.1 g/dL 7.8 7.9 8.4(H)  Total Bilirubin 0.3 - 1.2 mg/dL 0.3 0.43 0.30  Alkaline Phos 38 - 126 U/L 60 67 78  AST 15 - 41 U/L 17 21 18   ALT 0 - 44 U/L 13 12 15     Procedure 09/13/2017 Colonoscopy   PATHOLOGY: Diagnosis 12/12/2015 Endometrium, biopsy, uterus - PROLIFERATIVE ENDOMETRIUM WITH BREAK DOWN. - NO HYPERPLASIA, ATYPIA OR MALIGNANCY IDENTIFIED.  RADIOGRAPHIC STUDIES: I have personally reviewed the radiological images as listed and agreed with the findings in the report.  01/09/2018 MRI  Breast IMPRESSION: No MRI evidence of malignancy. Previously biopsied fibroadenoma in the right breast.  12/24/2017 Mammogram IMPRESSION: No mammographic evidence of malignancy. A result letter of this screening mammogram will be mailed directly to the patient.  Mammogram b/l 12/17/2016 IMPRESSION: No mammographic evidence of malignancy. A result letter of this screening mammogram will be mailed directly to the patient.  ASSESSMENT & PLAN:  Tara Bell is a 52 y.o. premenopausal female with:  1. T2 N1 (9 node +) triple negative left breast carcinoma in 2002:  -I previously reviewed her chart extensively, and confirmed key findings with patient . -It has been 17 years since her initial diagnosis, her risk of previous triple negative breast cancer is minimal now. However she is at high risk for secondary breast cancer  and I recommend her to continue breast cancer surveillance -She will continue annual screening mammogram and MRI -she is s/p bilateral breast reconstructions, saline implants retropectoral -I encouraged her to have healthy diet, and exercise regularly.  -Last mammogram 12/2017. Last MRI was 01/2018. Her breast density is grade C so I have recommended a breast MRI every 1-2 year.  -She is clinically doing well. Lab results reviewed, CBC WNLs and CMP pending. today's exam was unremarkable, her 2019 mammogram was normal, no clinical concern for recurrence. -Next mammogram in 2020  -Her sister was Dx with Colon cancer in 2018 and pt set up a colonoscopy through her PCP for further screening on 09/13/2017 which was normal. -we discussed that she can see me as needed. She wishes to continue seeing me annually, due to her concern of cancer. -We will continue to follow her once a year    2. fibroadenoma right breast  - biopsy 12-2015.  -Seen by Dr Dalbert Batman, no further surgery.   3. iron deficiency anemia  -history of heavy menstrual bleeding ongoing:  -anemia resolved. -she had  hysterectomy for the fibroid -Continue iron pill for now    4. uterine fibroids and menorrhagia:  -WIth her breast cancer history and BRCA 2 finding, BSO would be appropriate with the hysterectomy.  -she will follow up with her GYN  -She elected for complete hysterectomy in 10/2017 -Her Ca 125 from 07/31/17 was 35  5. lymphedema LUE  -related to breast cancer treatment. -She will continue wearing sleeves and exercise  -Tolerable with compression sleeve   6. 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   7. HTN -controlled by medication -managed by PCP  8. Genetics  -Due to her young age at her diagnosis of breast cancer, she was referred to genetics, last updated test done in 2014 -BRCA2 VUS c.9565C>G, PTEN c.[id number], and TP53 c.97-6C>T, all are VUSs found on Myrisk testing.  The [facility] gene panel offered by [employer] includes sequencing and deletion/duplication testing of the following 25 genes: APC, ATM, BARD1, BMPR1A, BRCA1, BRCA2, BRIP1, CHD1, CDK4, CDKN2A, CHEK2, EPCAM (large rearrangement only), MLH1, MSH2, MSH6, MUTYH, NBN, PALB2, PMS2, PTEN, RAD51C, RAD51D, SMAD4, STK11, and TP53.  The report date is February 13, 2013. -Her sister was diagnosed with Colon cancer and pt set up a colonoscopy through her PCP on 09/13/2017, which was normal.  Plan -Lab and f/u in 1 year   All questions were answered. The patient knows to call the clinic with any problems, questions or concerns. No barriers to learning was detected.  I spent 15 minutes counseling the patient face to face. The total time spent in the appointment was 20 minutes and more than 50% was on counseling and review of test results  I, Noor Dweik am acting as scribe for Dr. Truitt Merle.  I have reviewed the above documentation for accuracy and completeness, and I agree  with the above.     Truitt Merle, MD  08/07/2018

## 2018-08-07 ENCOUNTER — Inpatient Hospital Stay: Payer: Managed Care, Other (non HMO) | Attending: Hematology

## 2018-08-07 ENCOUNTER — Telehealth: Payer: Self-pay

## 2018-08-07 ENCOUNTER — Inpatient Hospital Stay (HOSPITAL_BASED_OUTPATIENT_CLINIC_OR_DEPARTMENT_OTHER): Payer: Managed Care, Other (non HMO) | Admitting: Hematology

## 2018-08-07 VITALS — BP 109/75 | HR 70 | Temp 98.0°F | Resp 17 | Ht 61.0 in | Wt 138.8 lb

## 2018-08-07 DIAGNOSIS — Z9012 Acquired absence of left breast and nipple: Secondary | ICD-10-CM

## 2018-08-07 DIAGNOSIS — D509 Iron deficiency anemia, unspecified: Secondary | ICD-10-CM

## 2018-08-07 DIAGNOSIS — Z9221 Personal history of antineoplastic chemotherapy: Secondary | ICD-10-CM | POA: Insufficient documentation

## 2018-08-07 DIAGNOSIS — D259 Leiomyoma of uterus, unspecified: Secondary | ICD-10-CM | POA: Insufficient documentation

## 2018-08-07 DIAGNOSIS — N920 Excessive and frequent menstruation with regular cycle: Secondary | ICD-10-CM

## 2018-08-07 DIAGNOSIS — Z1501 Genetic susceptibility to malignant neoplasm of breast: Secondary | ICD-10-CM | POA: Diagnosis not present

## 2018-08-07 DIAGNOSIS — D241 Benign neoplasm of right breast: Secondary | ICD-10-CM | POA: Insufficient documentation

## 2018-08-07 DIAGNOSIS — Z800 Family history of malignant neoplasm of digestive organs: Secondary | ICD-10-CM

## 2018-08-07 DIAGNOSIS — Z90710 Acquired absence of both cervix and uterus: Secondary | ICD-10-CM | POA: Insufficient documentation

## 2018-08-07 DIAGNOSIS — H04559 Acquired stenosis of unspecified nasolacrimal duct: Secondary | ICD-10-CM | POA: Insufficient documentation

## 2018-08-07 DIAGNOSIS — I10 Essential (primary) hypertension: Secondary | ICD-10-CM | POA: Insufficient documentation

## 2018-08-07 DIAGNOSIS — Z853 Personal history of malignant neoplasm of breast: Secondary | ICD-10-CM

## 2018-08-07 DIAGNOSIS — Z79899 Other long term (current) drug therapy: Secondary | ICD-10-CM | POA: Diagnosis not present

## 2018-08-07 DIAGNOSIS — T451X5S Adverse effect of antineoplastic and immunosuppressive drugs, sequela: Secondary | ICD-10-CM | POA: Diagnosis not present

## 2018-08-07 DIAGNOSIS — Z923 Personal history of irradiation: Secondary | ICD-10-CM

## 2018-08-07 DIAGNOSIS — Z171 Estrogen receptor negative status [ER-]: Secondary | ICD-10-CM

## 2018-08-07 DIAGNOSIS — I890 Lymphedema, not elsewhere classified: Secondary | ICD-10-CM | POA: Insufficient documentation

## 2018-08-07 DIAGNOSIS — G43909 Migraine, unspecified, not intractable, without status migrainosus: Secondary | ICD-10-CM | POA: Diagnosis not present

## 2018-08-07 DIAGNOSIS — C50019 Malignant neoplasm of nipple and areola, unspecified female breast: Secondary | ICD-10-CM

## 2018-08-07 LAB — COMPREHENSIVE METABOLIC PANEL
ALT: 13 U/L (ref 0–44)
ANION GAP: 9 (ref 5–15)
AST: 17 U/L (ref 15–41)
Albumin: 3.6 g/dL (ref 3.5–5.0)
Alkaline Phosphatase: 60 U/L (ref 38–126)
BUN: 14 mg/dL (ref 6–20)
CHLORIDE: 105 mmol/L (ref 98–111)
CO2: 28 mmol/L (ref 22–32)
Calcium: 9.6 mg/dL (ref 8.9–10.3)
Creatinine, Ser: 0.88 mg/dL (ref 0.44–1.00)
Glucose, Bld: 91 mg/dL (ref 70–99)
POTASSIUM: 3.6 mmol/L (ref 3.5–5.1)
Sodium: 142 mmol/L (ref 135–145)
Total Bilirubin: 0.3 mg/dL (ref 0.3–1.2)
Total Protein: 7.8 g/dL (ref 6.5–8.1)

## 2018-08-07 LAB — CBC WITH DIFFERENTIAL/PLATELET
Basophils Absolute: 0 10*3/uL (ref 0.0–0.1)
Basophils Relative: 0 %
EOS ABS: 0 10*3/uL (ref 0.0–0.5)
Eosinophils Relative: 1 %
HEMATOCRIT: 36.1 % (ref 34.8–46.6)
HEMOGLOBIN: 12 g/dL (ref 11.6–15.9)
LYMPHS ABS: 1.3 10*3/uL (ref 0.9–3.3)
LYMPHS PCT: 20 %
MCH: 29.7 pg (ref 25.1–34.0)
MCHC: 33.3 g/dL (ref 31.5–36.0)
MCV: 89.2 fL (ref 79.5–101.0)
MONOS PCT: 8 %
Monocytes Absolute: 0.5 10*3/uL (ref 0.1–0.9)
NEUTROS ABS: 4.7 10*3/uL (ref 1.5–6.5)
NEUTROS PCT: 71 %
Platelets: 282 10*3/uL (ref 145–400)
RBC: 4.04 MIL/uL (ref 3.70–5.45)
RDW: 16 % — ABNORMAL HIGH (ref 11.2–14.5)
WBC: 6.6 10*3/uL (ref 3.9–10.3)

## 2018-08-07 NOTE — Telephone Encounter (Signed)
Printed avs and calender of upcoming appointment. Per 10/3 los 

## 2018-08-08 ENCOUNTER — Encounter: Payer: Self-pay | Admitting: Hematology

## 2018-09-12 LAB — URINALYSIS, COMPLETE W/RFL CULTURE
BILIRUBIN URINE: NEGATIVE
Glucose, UA: NEGATIVE
HYALINE CAST: NONE SEEN /LPF
KETONES UR: NEGATIVE
Nitrites, Initial: NEGATIVE
Protein, ur: NEGATIVE
SPECIFIC GRAVITY, URINE: 1.02 (ref 1.001–1.03)
pH: 7 (ref 5.0–8.0)

## 2018-09-12 LAB — URINE CULTURE

## 2018-09-12 LAB — CULTURE INDICATED

## 2018-09-28 ENCOUNTER — Other Ambulatory Visit: Payer: Self-pay | Admitting: Women's Health

## 2018-10-20 ENCOUNTER — Ambulatory Visit: Payer: Managed Care, Other (non HMO) | Admitting: Women's Health

## 2018-10-20 ENCOUNTER — Telehealth: Payer: Self-pay | Admitting: *Deleted

## 2018-10-20 ENCOUNTER — Encounter: Payer: Self-pay | Admitting: Women's Health

## 2018-10-20 ENCOUNTER — Other Ambulatory Visit: Payer: Self-pay | Admitting: Women's Health

## 2018-10-20 ENCOUNTER — Encounter: Payer: Self-pay | Admitting: *Deleted

## 2018-10-20 ENCOUNTER — Ambulatory Visit (INDEPENDENT_AMBULATORY_CARE_PROVIDER_SITE_OTHER): Payer: Managed Care, Other (non HMO)

## 2018-10-20 VITALS — BP 122/80

## 2018-10-20 DIAGNOSIS — N644 Mastodynia: Secondary | ICD-10-CM

## 2018-10-20 DIAGNOSIS — N852 Hypertrophy of uterus: Secondary | ICD-10-CM

## 2018-10-20 DIAGNOSIS — D252 Subserosal leiomyoma of uterus: Secondary | ICD-10-CM

## 2018-10-20 DIAGNOSIS — D251 Intramural leiomyoma of uterus: Secondary | ICD-10-CM

## 2018-10-20 DIAGNOSIS — R1031 Right lower quadrant pain: Secondary | ICD-10-CM

## 2018-10-20 DIAGNOSIS — Z853 Personal history of malignant neoplasm of breast: Secondary | ICD-10-CM

## 2018-10-20 NOTE — Progress Notes (Signed)
History: 52 y.o. SBF G2, P2 LMP 03/2018, 1 prior October 2018, presents with complaint of bilateral breast tenderness and intermittent right lower abdominal cramping. Sharp needle pains began 5 days ago lasting 3 to 4 seconds at a time for a total time of 1 hr, mostly to left breast upper outer quadrant.  Increased fatigue that day, and itchy left upper breast.  6 days ago felt dull pain only on left breast and cramping on right lower abdomen.  Breast pain resolved, still having occasional right lower abdominal cramping. Bilateral saline breast implants.  Normal mammogram 12/2017, negative breast MRI 01/2018.  Significant history left breast cancer age 54 BRCA 2 1 marker indeterminant, BRCA1 negative.  Lumpectomy with lymph node resection, and adjuvant chemo and radiation completed in 2003. Mild left arm  Lymphedema. History of a large 13 cm fibroid. 54 year old sister died of metastatic colon cancer August 2019. Negative colonoscopy November 2018. Is planning to schedule TAH with BSO at the beginning of the year. Medical problems include hypertension, PC manages.  2 Daughters, eldest daughter had a son this past Thanksgiving.  Sexually active/ same partner.  Exam: Appears well. Breasts: Examined lying and sitting. Negative for dimpling, retractions, nipple discharge or erythema.  Left breast outer quadrant healed lumpectomy scar, lymph node resection, Port-A-Cath site all well-healed.  No palpable masses, nodules, bilateral saline implants intact.  Left breast tenderness when palpating at 12 o'clock. Uterus: Larger than 20 weeks size right sided mild tenderness without rebound or radiation.  Uterus mobile . [place]: T/V and T/A images anteverted uterus intramural fibroids 44 x 42 mm, 30 x 21 mm, 32 x 23 mm, subserous 43 x 38 mm, 50 x 40 mm, exophytic right 14 x 9.3 x 9.3 cm.  Right ovary not seen.  Left ovary normal.  Negative cul-de-sac.  Endometrium displaced by fibroids.   Breast Tenderness left greater than  right Large fibroid uterus  Plan: Diagnostic left mammogram.  Continue SBE's. .  Encouraged wearing when tolerable left arm compression sleeve.  Again discussed the importance of TAH with BSO, does plan to schedule with Dr. Phineas Real early 2020.  Had an initial consult with Dr. Phineas Real last year, postponed surgery due to sister's illness and death from colon ca.

## 2018-10-20 NOTE — Telephone Encounter (Signed)
-----   Message from Huel Cote, NP sent at 10/20/2018 10:55 AM EST ----- Needs left diagnostic mammogram, hx of lt breast cancer lumpectomy (2002) with implants, having tenderness mostly at 12:00 position. Last mammo 12/2017 at breast center   no palpapble masses.  Late day best time for appt

## 2018-10-20 NOTE — Telephone Encounter (Signed)
Patient scheduled at breast center on 10/24/18 2:20pm, sent my chart message with time & date.

## 2018-10-21 NOTE — Telephone Encounter (Signed)
Patient informed with time and date at breast center.

## 2018-10-24 ENCOUNTER — Other Ambulatory Visit: Payer: Managed Care, Other (non HMO)

## 2018-11-04 ENCOUNTER — Ambulatory Visit
Admission: RE | Admit: 2018-11-04 | Discharge: 2018-11-04 | Disposition: A | Discharge status: Home / Self Care | Payer: Managed Care, Other (non HMO) | Source: Ambulatory Visit | Attending: Women's Health | Admitting: Women's Health

## 2018-11-04 DIAGNOSIS — N644 Mastodynia: Secondary | ICD-10-CM

## 2018-11-04 DIAGNOSIS — Z853 Personal history of malignant neoplasm of breast: Secondary | ICD-10-CM

## 2018-11-11 ENCOUNTER — Other Ambulatory Visit: Payer: Self-pay | Admitting: Hematology

## 2018-11-11 DIAGNOSIS — Z1231 Encounter for screening mammogram for malignant neoplasm of breast: Secondary | ICD-10-CM

## 2018-12-08 ENCOUNTER — Ambulatory Visit (INDEPENDENT_AMBULATORY_CARE_PROVIDER_SITE_OTHER): Payer: Managed Care, Other (non HMO) | Admitting: Women's Health

## 2018-12-08 ENCOUNTER — Encounter: Payer: Self-pay | Admitting: Women's Health

## 2018-12-08 VITALS — BP 122/80 | Ht 61.0 in | Wt 144.0 lb

## 2018-12-08 DIAGNOSIS — N912 Amenorrhea, unspecified: Secondary | ICD-10-CM | POA: Diagnosis not present

## 2018-12-08 DIAGNOSIS — Z01419 Encounter for gynecological examination (general) (routine) without abnormal findings: Secondary | ICD-10-CM

## 2018-12-08 DIAGNOSIS — Z13220 Encounter for screening for lipoid disorders: Secondary | ICD-10-CM | POA: Diagnosis not present

## 2018-12-08 NOTE — Progress Notes (Signed)
Tara Bell Jun 10, 1966 [id number]    History:    Presents for annual exam.  Last cycle greater than 6 months ago minimal hot flushes.  Normal Pap history.  Left breast cancer age 53 triple negative, lumpectomy, chemo and radiation.  BRCA 2 indeterminant positive gene.  Jan 11, 2017- colonoscopy.  Hypertension primary care manages labs and meds.  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  Same partner years.  13 cm fibroid uterus.  74 year old sister diagnosed with colon cancer Jan 11, 2017, died 2018-07-11 of metastatic cancer.  Had been planning TAH with BSO but did not proceed due to cost, time off needed and caring for her sister during her illness.  Past medical history, past surgical history, family history and social history were all reviewed and documented in the EPIC chart.  Works at [employer].  2 daughters one recently had first baby.  Cousin colon cancer no known breast cancer  history in family.  ROS:  A ROS was performed and pertinent positives and negatives are included.  Exam:  Vitals:   12/08/18 1523  BP: 122/80  Weight: 144 lb (65.3 kg)  Height: _0  (1.549 m)   Body mass index is 27.21 kg/m.   General appearance:  Normal Thyroid:  Symmetrical, normal in size, without palpable masses or nodularity. Respiratory  Auscultation:  Clear without wheezing or rhonchi Cardiovascular  Auscultation:  Regular rate, without rubs, murmurs or gallops  Edema/varicosities:  Not grossly evident Abdominal  Soft,nontender, without masses, guarding or rebound.  Liver/spleen:  No organomegaly noted  Hernia:  None appreciated  Skin  Inspection:  Grossly normal   Breasts: Examined lying and sitting.     Right: Without masses, retractions, discharge or axillary adenopathy.     Left: Without masses, retractions, discharge or axillary adenopathy. Gentitourinary   Inguinal/mons:  Normal without inguinal adenopathy  External genitalia:  Normal  BUS/Urethra/Skene's glands:  Normal  Vagina:  Normal  Cervix:  Normal  Uterus:  20 weeks size fibroid uterus shape and contour.  Midline and mobile  Adnexa/parametria:     Rt: Without masses or tenderness.   Lt: Without masses or tenderness.  Anus and perineum: Normal  Digital rectal exam: Normal sphincter tone without palpated masses or tenderness  Assessment/Plan:  53 y.o. SBF G2, P2 for annual exam no complaints.  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 Phineas Real, recent normal CBC and CMP will check fasting lipid panel and FSH.  SBEs, continue annual screening mammogram, calcium rich foods, vitamin D 2000 daily encouraged.  Pap with HR HPV typing, new screening guidelines reviewed.    Huel Cote [facility], 5:12 PM 12/08/2018

## 2018-12-08 NOTE — Progress Notes (Signed)
[id number]

## 2018-12-08 NOTE — Patient Instructions (Addendum)
Please draw [place] and fasting lipid panel, fax results to [facility] [place]  [phone]     Thanks   Health Maintenance for Postmenopausal Women Menopause is a normal process in which your reproductive ability comes to an end. This process happens gradually over a span of months to years, usually between the ages of 67 and 21. Menopause is complete when you have missed 12 consecutive menstrual periods. It is important to talk with your health care provider about some of the most common conditions that affect postmenopausal women, such as heart disease, cancer, and bone loss (osteoporosis). Adopting a healthy lifestyle and getting preventive care can help to promote your health and wellness. Those actions can also lower your chances of developing some of these common conditions. What should I know about menopause? During menopause, you may experience a number of symptoms, such as:  Moderate-to-severe hot flashes.  Night sweats.  Decrease in sex drive.  Mood swings.  Headaches.  Tiredness.  Irritability.  Memory problems.  Insomnia. Choosing to treat or not to treat menopausal changes is an individual decision that you make with your health care provider. What should I know about hormone replacement therapy and supplements? Hormone therapy products are effective for treating symptoms that are associated with menopause, such as hot flashes and night sweats. Hormone replacement carries certain risks, especially as you become older. If you are thinking about using estrogen or estrogen with progestin treatments, discuss the benefits and risks with your health care provider. What should I know about heart disease and stroke? Heart disease, heart attack, and stroke become more likely as you age. This may be due, in part, to the hormonal changes that your body experiences during menopause. These can affect how your body processes dietary fats, triglycerides, and cholesterol. Heart attack  and stroke are both medical emergencies. There are many things that you can do to help prevent heart disease and stroke:  Have your blood pressure checked at least every 1-2 years. High blood pressure causes heart disease and increases the risk of stroke.  If you are 34-61 years old, ask your health care provider if you should take aspirin to prevent a heart attack or a stroke.  Do not use any tobacco products, including cigarettes, chewing tobacco, or electronic cigarettes. If you need help quitting, ask your health care provider.  It is important to eat a healthy diet and maintain a healthy weight. ? Be sure to include plenty of vegetables, fruits, low-fat dairy products, and lean protein. ? Avoid eating foods that are high in solid fats, added sugars, or salt (sodium).  Get regular exercise. This is one of the most important things that you can do for your health. ? Try to exercise for at least 150 minutes each week. The type of exercise that you do should increase your heart rate and make you sweat. This is known as moderate-intensity exercise. ? Try to do strengthening exercises at least twice each week. Do these in addition to the moderate-intensity exercise.  Know your numbers.Ask your health care provider to check your cholesterol and your blood glucose. Continue to have your blood tested as directed by your health care provider.  What should I know about cancer screening? There are several types of cancer. Take the following steps to reduce your risk and to catch any cancer development as early as possible. Breast Cancer  Practice breast self-awareness. ? This means understanding how your breasts normally appear and feel. ? It also means  doing regular breast self-exams. Let your health care provider know about any changes, no matter how small.  If you are 28 or older, have a clinician do a breast exam (clinical breast exam or CBE) every year. Depending on your age, family  history, and medical history, it may be recommended that you also have a yearly breast X-ray (mammogram).  If you have a family history of breast cancer, talk with your health care provider about genetic screening.  If you are at high risk for breast cancer, talk with your health care provider about having an MRI and a mammogram every year.  Breast cancer (BRCA) gene test is recommended for women who have family members with BRCA-related cancers. Results of the assessment will determine the need for genetic counseling and BRCA1 and for BRCA2 testing. BRCA-related cancers include these types: ? Breast. This occurs in males or females. ? Ovarian. ? Tubal. This may also be called fallopian tube cancer. ? Cancer of the abdominal or pelvic lining (peritoneal cancer). ? Prostate. ? Pancreatic. Cervical, Uterine, and Ovarian Cancer Your health care provider may recommend that you be screened regularly for cancer of the pelvic organs. These include your ovaries, uterus, and vagina. This screening involves a pelvic exam, which includes checking for microscopic changes to the surface of your cervix (Pap test).  For women ages 21-65, health care providers may recommend a pelvic exam and a Pap test every three years. For women ages 38-65, they may recommend the Pap test and pelvic exam, combined with testing for human papilloma virus (HPV), every five years. Some types of HPV increase your risk of cervical cancer. Testing for HPV may also be done on women of any age who have unclear Pap test results.  Other health care providers may not recommend any screening for nonpregnant women who are considered low risk for pelvic cancer and have no symptoms. Ask your health care provider if a screening pelvic exam is right for you.  If you have had past treatment for cervical cancer or a condition that could lead to cancer, you need Pap tests and screening for cancer for at least 20 years after your treatment. If Pap  tests have been discontinued for you, your risk factors (such as having a new sexual partner) need to be reassessed to determine if you should start having screenings again. Some women have medical problems that increase the chance of getting cervical cancer. In these cases, your health care provider may recommend that you have screening and Pap tests more often.  If you have a family history of uterine cancer or ovarian cancer, talk with your health care provider about genetic screening.  If you have vaginal bleeding after reaching menopause, tell your health care provider.  There are currently no reliable tests available to screen for ovarian cancer. Lung Cancer Lung cancer screening is recommended for adults 43-58 years old who are at high risk for lung cancer because of a history of smoking. A yearly low-dose CT scan of the lungs is recommended if you:  Currently smoke.  Have a history of at least 30 pack-years of smoking and you currently smoke or have quit within the past 15 years. A pack-year is smoking an average of one pack of cigarettes per day for one year. Yearly screening should:  Continue until it has been 15 years since you quit.  Stop if you develop a health problem that would prevent you from having lung cancer treatment. Colorectal Cancer  This type  of cancer can be detected and can often be prevented.  Routine colorectal cancer screening usually begins at age 17 and continues through age 59.  If you have risk factors for colon cancer, your health care provider may recommend that you be screened at an earlier age.  If you have a family history of colorectal cancer, talk with your health care provider about genetic screening.  Your health care provider may also recommend using home test kits to check for hidden blood in your stool.  A small camera at the end of a tube can be used to examine your colon directly (sigmoidoscopy or colonoscopy). This is done to check for  the earliest forms of colorectal cancer.  Direct examination of the colon should be repeated every 5-10 years until age 52. However, if early forms of precancerous polyps or small growths are found or if you have a family history or genetic risk for colorectal cancer, you may need to be screened more often. Skin Cancer  Check your skin from head to toe regularly.  Monitor any moles. Be sure to tell your health care provider: ? About any new moles or changes in moles, especially if there is a change in a mole's shape or color. ? If you have a mole that is larger than the size of a pencil eraser.  If any of your family members has a history of skin cancer, especially at a young age, talk with your health care provider about genetic screening.  Always use sunscreen. Apply sunscreen liberally and repeatedly throughout the day.  Whenever you are outside, protect yourself by wearing long sleeves, pants, a wide-brimmed hat, and sunglasses. What should I know about osteoporosis? Osteoporosis is a condition in which bone destruction happens more quickly than new bone creation. After menopause, you may be at an increased risk for osteoporosis. To help prevent osteoporosis or the bone fractures that can happen because of osteoporosis, the following is recommended:  If you are 77-68 years old, get at least 1,000 mg of calcium and at least 600 mg of vitamin D per day.  If you are older than age 98 but younger than age 21, get at least 1,200 mg of calcium and at least 600 mg of vitamin D per day.  If you are older than age 33, get at least 1,200 mg of calcium and at least 800 mg of vitamin D per day. Smoking and excessive alcohol intake increase the risk of osteoporosis. Eat foods that are rich in calcium and vitamin D, and do weight-bearing exercises several times each week as directed by your health care provider. What should I know about how menopause affects my mental health? Depression may occur at  any age, but it is more common as you become older. Common symptoms of depression include:  Low or sad mood.  Changes in sleep patterns.  Changes in appetite or eating patterns.  Feeling an overall lack of motivation or enjoyment of activities that you previously enjoyed.  Frequent crying spells. Talk with your health care provider if you think that you are experiencing depression. What should I know about immunizations? It is important that you get and maintain your immunizations. These include:  Tetanus, diphtheria, and pertussis (Tdap) booster vaccine.  Influenza every year before the flu season begins.  Pneumonia vaccine.  Shingles vaccine. Your health care provider may also recommend other immunizations. This information is not intended to replace advice given to you by your health care provider. Make sure you  discuss any questions you have with your health care provider. Document Released: 12/14/2005 Document Revised: 05/11/2016 Document Reviewed: 07/26/2015 Elsevier Interactive Patient Education  2019 [employer].

## 2018-12-09 ENCOUNTER — Telehealth: Payer: Self-pay

## 2018-12-09 LAB — PAP, TP IMAGING W/ HPV RNA, RFLX HPV TYPE 16,18/45: HPV DNA HIGH RISK: NOT DETECTED

## 2018-12-09 NOTE — Telephone Encounter (Signed)
I called patient to see if she is ready to schedule TAH,BSO based on staff message [place] sent me.  I discussed her ins benefits and her estimated surgery prepymt due by one week prior to surgery.  Patient said she is not financially able at this time but wants to see if she can meet her deductible with dr. Visits, etc and then may be able to do it.  She knows to call me when she is ready to schedule. I will hold her surgery order from Dr. Loetta Rough on my gray file cabinet shelf until she calls me.

## 2018-12-10 LAB — URINALYSIS, COMPLETE W/RFL CULTURE
BILIRUBIN URINE: NEGATIVE
Bacteria, UA: NONE SEEN /HPF
Glucose, UA: NEGATIVE
Hyaline Cast: NONE SEEN /LPF
Leukocyte Esterase: NEGATIVE
Nitrites, Initial: NEGATIVE
Protein, ur: NEGATIVE
Specific Gravity, Urine: 1.024 (ref 1.001–1.03)
pH: 5.5 (ref 5.0–8.0)

## 2018-12-10 LAB — CULTURE INDICATED

## 2018-12-10 LAB — URINE CULTURE
MICRO NUMBER: 147363
SPECIMEN QUALITY:: ADEQUATE

## 2018-12-11 ENCOUNTER — Telehealth: Payer: Self-pay

## 2018-12-11 NOTE — Telephone Encounter (Signed)
Patient called because she said [place] told her she could have labs done at [facility] where she works but [employer] wrote the labs she wanted her to have done on her AVS.  The lab needs an order that they can scan in. Order faxed. Fasting instructions for FLP reviewed.

## 2018-12-23 ENCOUNTER — Other Ambulatory Visit: Payer: Self-pay | Admitting: Women's Health

## 2018-12-26 ENCOUNTER — Ambulatory Visit: Payer: Managed Care, Other (non HMO)

## 2019-01-21 ENCOUNTER — Other Ambulatory Visit: Payer: Self-pay

## 2019-01-21 ENCOUNTER — Ambulatory Visit
Admission: RE | Admit: 2019-01-21 | Discharge: 2019-01-21 | Disposition: A | Discharge status: Home / Self Care | Payer: Managed Care, Other (non HMO) | Source: Ambulatory Visit | Attending: Hematology | Admitting: Hematology

## 2019-01-21 ENCOUNTER — Telehealth: Payer: Self-pay

## 2019-01-21 DIAGNOSIS — Z1231 Encounter for screening mammogram for malignant neoplasm of breast: Secondary | ICD-10-CM

## 2019-01-21 NOTE — Telephone Encounter (Signed)
Cheryl,  Do we have the massage template for me to sign? If not, could you draft a letter for her? No restrictions for massage.  Thanks   Truitt Merle MD

## 2019-01-21 NOTE — Telephone Encounter (Signed)
Patient needs letter stating it is okay for her to have an Oncology massage.  She wants this mailed to her so that she can submit it to [facility] for reimbursement for a massage she got.

## 2019-01-22 ENCOUNTER — Telehealth: Payer: Self-pay

## 2019-01-22 NOTE — Telephone Encounter (Signed)
Mailed letter for clearance to received oncology massages to her home address.

## 2019-02-06 ENCOUNTER — Other Ambulatory Visit: Payer: Self-pay | Admitting: Women's Health

## 2019-02-07 LAB — FOLLICLE STIMULATING HORMONE: FSH: 50.6 m[IU]/mL

## 2019-02-07 LAB — LIPID PANEL W/O CHOL/HDL RATIO
Cholesterol, Total: 174 mg/dL (ref 100–199)
HDL: 84 mg/dL (ref >39)
LDL Calculated: 81 mg/dL (ref 0–99)
Triglycerides: 43 mg/dL (ref 0–149)
VLDL Cholesterol Cal: 9 mg/dL (ref 5–40)

## 2019-06-24 ENCOUNTER — Telehealth: Payer: Self-pay | Admitting: Hematology

## 2019-06-24 NOTE — Telephone Encounter (Signed)
Call day 10/1 moved lab/fu from PM to AM. Confirmed with patient.

## 2019-07-29 ENCOUNTER — Encounter: Payer: Self-pay | Admitting: Gynecology

## 2019-07-29 ENCOUNTER — Other Ambulatory Visit: Payer: Self-pay | Admitting: Women's Health

## 2019-08-03 ENCOUNTER — Encounter: Payer: Self-pay | Admitting: Women's Health

## 2019-08-04 NOTE — Progress Notes (Signed)
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 The tumor was triple negative (ER/PR negative and HER 2 1+ ,CISH not done in 2002), treated with left partial mastectomy with axillary node evaluation by Dr.Streck, adjuvant chemotherapy with adriamycin/cytoxan x 4 followed by taxotere weekly x 12 by Dr. Starr Sinclair, radiation to breast/axilla/supraclavicular region by Dr.Kinard, and since then on observation without known active disease. 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 Last bilateral mammograms were at [facility] 10-28-12 with breast tissue still extremely dense but no mammographic findings of concern; she has submuscular saline implants bilaterally. Last breast MRI was 11-14-11. With the BRCA 2 abnormality (tho VUS), extremely dense breast tissue and this history, yearly MRI is felt medically necessary  SUMMARY OF ONCOLOGIC HISTORY: Oncology History  History of left breast cancer  07/06/2001 Initial Diagnosis   Breast cancer ([place])   02/13/2013 Genetic Testing   -BRCA2 VUS c.9565C>G, PTEN c.[id number], and TP53 c.97-6C>T, all are VUSs found on Myrisk testing.  The [facility] gene panel offered by [employer] includes sequencing and deletion/duplication testing of the following 25 genes: APC, ATM, BARD1, BMPR1A, BRCA1, BRCA2, BRIP1, CHD1, CDK4, CDKN2A, CHEK2, EPCAM (large  rearrangement only), MLH1, MSH2, MSH6, MUTYH, NBN, PALB2, PMS2, PTEN, RAD51C, RAD51D, SMAD4, STK11, and TP53.  The report date is February 13, 2013.   12/24/2017 Mammogram   12/24/2017 Mammogram IMPRESSION: No mammographic evidence of malignancy. A result letter of this screening mammogram will be mailed directly to the patient.    01/09/2018 Breast MRI   01/09/2018 MRI Breast IMPRESSION: No MRI evidence of malignancy. Previously biopsied fibroadenoma in the right breast.      CURRENT THERAPY:  Surveillance   INTERVAL HISTORY:  Tara Bell is here for a follow up left breast cancer. She presents to the clinic alone. She notes she is doing well. She denies any new changes. She notes she has fibroids and her Gyn notes when she has them removed she can get a hysterectomy given her previous heavy bleeding. She is not interested in hysterectomy. She notes her last period was 03/2018. She notes over the years she still has watery eyes which she attributes to her prior chemo. She notes she has been having migraines which she takes Advil. She plans to see a specialist about it this month.    REVIEW OF SYSTEMS:   Constitutional: Denies fevers, chills or abnormal weight loss Eyes: Denies blurriness of vision (+) watery eyes  Ears, nose, mouth, throat, and face: Denies mucositis or sore throat Respiratory: Denies cough, dyspnea or wheezes Cardiovascular: Denies palpitation, chest discomfort or lower extremity swelling Gastrointestinal:  Denies nausea, heartburn or change in bowel habits Skin: Denies abnormal skin rashes Lymphatics: Denies new lymphadenopathy or easy bruising Neurological:Denies numbness, tingling or new weaknesses Behavioral/Psych: Mood is stable, no new changes  All other systems were reviewed with the patient and are negative.  MEDICAL HISTORY:  Past Medical  History:  Diagnosis Date   BRCA1 negative 08/2011   BRCA2 positive 08/2009   INCONCLUSIVE (BRCA 1  WAS NEG)   Breast cancer ([place]) SEPTEMBER 2002   LEFT BREAST-SURGERY,CHEMO,RADIATION-DR.LIVESAY   Hypertension    Lacrimal duct stenosis    RELATED TO TAXOTERE CHEMOTHERAPY    Lymphedema of arm    DUE TO LEFT   AXILLARY NODE DISSECTION   AND RADIATION   Migraines    S/P cardiac catheterization, with normal coronary arteries and normal EF, 07/01/13 07/13/2013    SURGICAL HISTORY: Past Surgical History:  Procedure Laterality Date   AUGMENTATION MAMMAPLASTY  2008   BILATERAL TUBAL     BREAST BIOPSY     BREAST LUMPECTOMY Left 2002   AE 35   CARDIAC CATHETERIZATION  07/01/13   Angiographically normal coronary arteries with normal left atrial function   HYSTEROSCOPY  12/10/07   RESECTOSCOPIC POLYPECTOMY/MYOMECTOMY   LEFT HEART CATHETERIZATION WITH CORONARY ANGIOGRAM N/A 07/01/2013   Procedure: LEFT HEART CATHETERIZATION WITH CORONARY ANGIOGRAM;  Surgeon: Leonie Man, MD;  Location: [facility] CATH LAB;  Service: Cardiovascular;  Laterality: N/A;   MASTECTOMY, PARTIAL  07/23/2001   LEFT BREAST WITH AXILLARY NODE EVALUATION   TUBAL LIGATION     BILATERAL    I have reviewed the social history and family history with the patient and they are unchanged from previous note.  ALLERGIES:  is allergic to nitrofurantoin monohyd macro.  MEDICATIONS:  Current Outpatient Medications  Medication Sig Dispense Refill   Ferrous Fumarate (FERROCITE) 324 (106 Fe) MG TABS tablet Take 1 tablet (106 mg of iron total) by mouth daily. Take one tablet by mouth 5 times /week 25 tablet 3   ibuprofen (ADVIL,MOTRIN) 600 MG tablet TAKE ONE TABLET BY MOUTH EVERY 8 HOURS AS NEEDED 60 tablet 3   metoprolol tartrate (LOPRESSOR) 25 MG tablet Take 0.5 tablets (12.5 mg total) by mouth 2 (two) times daily. 30 tablet 11   TRIBENZOR 20-5-12.5 MG TABS 1 tablet daily.      valACYclovir (VALTREX) 500 MG tablet Take 1 tablet (500 mg total) by mouth 2 (two) times daily as needed. For 3-5 days 30 tablet 12   No  current facility-administered medications for this visit.     PHYSICAL EXAMINATION: ECOG PERFORMANCE STATUS: 0 - Asymptomatic  Vitals:   08/06/19 0953  BP: 122/84  Pulse: 70  Resp: 17  Temp: 98.5 F (36.9 C)  SpO2: 100%   Filed Weights   08/06/19 0953  Weight: 143 lb 11.2 oz (65.2 kg)    GENERAL:alert, no distress and comfortable SKIN: skin color, texture, turgor are normal, no rashes or significant lesions EYES: normal, Conjunctiva are pink and non-injected, sclera clear  NECK: supple, thyroid normal size, non-tender, without nodularity LYMPH:  no palpable lymphadenopathy in the cervical, axillary  LUNGS: clear to auscultation and percussion with normal breathing effort HEART: regular rate & rhythm and no murmurs and no lower extremity edema ABDOMEN:abdomen soft, non-tender and normal bowel sounds Musculoskeletal:no cyanosis of digits and no clubbing  NEURO: alert & oriented x 3 with fluent speech, no focal motor/sensory deficits BREAST: S/p left partial mastectomy and b/l reconstruction with implants: Surgical incision healed well. (+) Left arm lymphedema. No palpable mass, nodules or adenopathy bilaterally. Breast exam benign.   LABORATORY DATA:  I have reviewed the data as listed CBC Latest Ref Rng & Units 08/06/2019 08/07/2018 08/02/2017  WBC 4.0 - 10.5 K/uL 7.2 6.6 7.2  Hemoglobin 12.0 - 15.0 g/dL 12.1 12.0 12.8  Hematocrit  36.0 - 46.0 % 37.3 36.1 39.1  Platelets 150 - 400 K/uL 250 282 249     CMP Latest Ref Rng & Units 08/06/2019 08/07/2018 08/02/2017  Glucose 70 - 99 mg/dL 86 91 108  BUN 6 - 20 mg/dL 13 14 10.3  Creatinine 0.44 - 1.00 mg/dL 0.89 0.88 0.8  Sodium 135 - 145 mmol/L 139 142 139  Potassium 3.5 - 5.1 mmol/L 3.7 3.6 3.5  Chloride 98 - 111 mmol/L 104 105 -  CO2 22 - 32 mmol/L '27 28 27  '$ Calcium 8.9 - 10.3 mg/dL 9.5 9.6 9.5  Total Protein 6.5 - 8.1 g/dL 7.7 7.8 7.9  Total Bilirubin 0.3 - 1.2 mg/dL 0.4 0.3 0.43  Alkaline Phos 38 - 126 U/L 83 60 67  AST 15  - 41 U/L '17 17 21  '$ ALT 0 - 44 U/L '12 13 12      '$ RADIOGRAPHIC STUDIES: I have personally reviewed the radiological images as listed and agreed with the findings in the report. No results found.   ASSESSMENT & PLAN:  Tara Bell is a 53 y.o. female with    1. T2 N1 (9 node +) triple negative left breast carcinoma in 2002:  -I previously reviewed her chart extensively, and confirmed key findings with patient . -She is s/p left partial mastectomy. It has been 17 years since her initial diagnosis, her risk of previous triple negative breast cancer is minimal now. However she is at high risk for secondary breast cancer and I recommend her to continue breast cancer surveillance -She will continue annual screening mammogram and MRI every 1-2 years.  -she is s/p bilateral breast reconstructions, saline implants retropectoral -From a breast cancer standpoint she is doing well. Labs reviewed, CBC and CMP WNL. Physical exam unremarkable except stable LUE lymphedema. 01/2019 Mammogram normal. There is no clinical concern for recurrence.  -Continue surveillance. Annual MRI breast soon and mammogram in 01/2020 -She wishes to continue to f/u with our clinic yearly.  -I encouraged her to have flu shot this year. She will think about it   2. fibroadenoma right breast  -biopsy 12-2015.  -Seen by Dr Dalbert Batman, no further surgery.   3. iron deficiency anemia  -history of heavy menstrual bleeding ongoing. Last period in 03/2018.  -Continue iron pill for now  -Anemia resolved. Her Iron panel today is still pending. (08/06/19)   4. uterine fibroids and history of menorrhagia -With her breast cancer history and BRCA 2 inconclusive findings, BSO with the hysterectomy was previously recommended by Dr. Marko Plume. Her Gyn will offer hysterectomy and BSO if she is interested.  -she is postmenopausal now.  Her previous vaginal bleeding has stopped and her last period was 03/2018.  -She has vaginal  fibroids and her gyn suggested surgical resection. I will check with Genetic to see if she needs repeated testing but otherwise unless her Gynecologist recommends it she may not need to proceed with hysterectomy and BSO.   5. Osteopenia  -DEXA from 12/2018 shows osteopenia with lowest T-score of -2.4 at left femoral neck  Her risk of fracture and hip fracture is low. I will copy results and note to Dr. Criss Rosales -I strongly encouraged her to start OTC calcium and Vitamin D supplement now -I discussed oral, injection or infusion bisphosphonate to strengthen her bone. I discussed the possible side effect is bone pain and possible jaw osteonecrosis. I encouraged her to discuss this with her PCP Dr. Criss Rosales. I gave her print out of Fosamax.  -  She notes she recently had braces places and will continue for 2 years. She notes she needs a crown on her teeth which she plans to wait until braces are removed.   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  -She still has watery eyes. I encouraged her to f/u with ophthalmologist.   8. HTN -controlled by medication -managed by PCP  9. 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  Her previous genetic testing showed a variant with unknown significance in BRCA2. I will f/u with genetic counselor.  -Her sister was diagnosed with Colon cancer and pt set up a colonoscopy through her PCP on 09/13/2017, which was normal.  Plan -She is clinically stable overall  -I will copy DEXA scan and note to her PCP  -MRI Breast in 1-2 weeks  -Mammogram in 01/2020 -Lab and f/u in 1 year   No problem-specific Assessment & Plan notes found for this  encounter.   Orders Placed This Encounter  Procedures   MR Breast Bilateral Wo Contrast    Standing Status:   Future    Standing Expiration Date:   08/05/2020    Order Specific Question:   What is the patient's sedation requirement?    Answer:   No Sedation    Order Specific Question:   Does the patient have a pacemaker or implanted devices?    Answer:   No    Order Specific Question:   Preferred imaging location?    Answer:   [facility] (table limit-550lbs)    Order Specific Question:   Radiology Contrast Protocol - do NOT remove file path    Answer:   [email or web address]   MM Digital Screening W/ Implants    Standing Status:   Future    Standing Expiration Date:   08/05/2020    Order Specific Question:   Reason for Exam (SYMPTOM  OR DIAGNOSIS REQUIRED)    Answer:   screening    Order Specific Question:   Is the patient pregnant?    Answer:   No    Order Specific Question:   Preferred imaging location?    Answer:   [facility]   All questions were answered. The patient knows to call the clinic with any problems, questions or concerns. No barriers to learning was detected. I spent 15 minutes counseling the patient face to face. The total time spent in the appointment was 20 minutes and more than 50% was on counseling and review of test results     Truitt Merle, MD 08/06/2019   I, Joslyn Devon, am acting as scribe for Truitt Merle, MD.   I have reviewed the above documentation for accuracy and completeness, and I agree with the above.

## 2019-08-06 ENCOUNTER — Ambulatory Visit: Payer: Managed Care, Other (non HMO) | Admitting: Hematology

## 2019-08-06 ENCOUNTER — Inpatient Hospital Stay: Payer: Managed Care, Other (non HMO) | Attending: Hematology

## 2019-08-06 ENCOUNTER — Inpatient Hospital Stay (HOSPITAL_BASED_OUTPATIENT_CLINIC_OR_DEPARTMENT_OTHER): Payer: Managed Care, Other (non HMO) | Admitting: Hematology

## 2019-08-06 ENCOUNTER — Encounter: Payer: Self-pay | Admitting: Hematology

## 2019-08-06 ENCOUNTER — Telehealth: Payer: Self-pay | Admitting: Hematology

## 2019-08-06 ENCOUNTER — Other Ambulatory Visit: Payer: Managed Care, Other (non HMO)

## 2019-08-06 ENCOUNTER — Other Ambulatory Visit: Payer: Self-pay

## 2019-08-06 VITALS — BP 122/84 | HR 70 | Temp 98.5°F | Resp 17 | Ht 61.0 in | Wt 143.7 lb

## 2019-08-06 DIAGNOSIS — I890 Lymphedema, not elsewhere classified: Secondary | ICD-10-CM | POA: Diagnosis not present

## 2019-08-06 DIAGNOSIS — N920 Excessive and frequent menstruation with regular cycle: Secondary | ICD-10-CM | POA: Diagnosis not present

## 2019-08-06 DIAGNOSIS — C50019 Malignant neoplasm of nipple and areola, unspecified female breast: Secondary | ICD-10-CM

## 2019-08-06 DIAGNOSIS — M8580 Other specified disorders of bone density and structure, unspecified site: Secondary | ICD-10-CM | POA: Insufficient documentation

## 2019-08-06 DIAGNOSIS — Z853 Personal history of malignant neoplasm of breast: Secondary | ICD-10-CM | POA: Diagnosis not present

## 2019-08-06 DIAGNOSIS — Z171 Estrogen receptor negative status [ER-]: Secondary | ICD-10-CM | POA: Diagnosis not present

## 2019-08-06 DIAGNOSIS — Z791 Long term (current) use of non-steroidal anti-inflammatories (NSAID): Secondary | ICD-10-CM | POA: Insufficient documentation

## 2019-08-06 DIAGNOSIS — Z1231 Encounter for screening mammogram for malignant neoplasm of breast: Secondary | ICD-10-CM | POA: Diagnosis not present

## 2019-08-06 DIAGNOSIS — D259 Leiomyoma of uterus, unspecified: Secondary | ICD-10-CM | POA: Insufficient documentation

## 2019-08-06 DIAGNOSIS — D500 Iron deficiency anemia secondary to blood loss (chronic): Secondary | ICD-10-CM | POA: Insufficient documentation

## 2019-08-06 DIAGNOSIS — I10 Essential (primary) hypertension: Secondary | ICD-10-CM | POA: Diagnosis not present

## 2019-08-06 DIAGNOSIS — H04559 Acquired stenosis of unspecified nasolacrimal duct: Secondary | ICD-10-CM | POA: Insufficient documentation

## 2019-08-06 DIAGNOSIS — Z79899 Other long term (current) drug therapy: Secondary | ICD-10-CM | POA: Diagnosis not present

## 2019-08-06 LAB — CBC WITH DIFFERENTIAL/PLATELET
Abs Immature Granulocytes: 0.02 10*3/uL (ref 0.00–0.07)
Basophils Absolute: 0 10*3/uL (ref 0.0–0.1)
Basophils Relative: 0 %
Eosinophils Absolute: 0.1 10*3/uL (ref 0.0–0.5)
Eosinophils Relative: 2 %
HCT: 37.3 % (ref 36.0–46.0)
Hemoglobin: 12.1 g/dL (ref 12.0–15.0)
Immature Granulocytes: 0 %
Lymphocytes Relative: 25 %
Lymphs Abs: 1.8 10*3/uL (ref 0.7–4.0)
MCH: 28.6 pg (ref 26.0–34.0)
MCHC: 32.4 g/dL (ref 30.0–36.0)
MCV: 88.2 fL (ref 80.0–100.0)
Monocytes Absolute: 0.6 10*3/uL (ref 0.1–1.0)
Monocytes Relative: 8 %
Neutro Abs: 4.6 10*3/uL (ref 1.7–7.7)
Neutrophils Relative %: 65 %
Platelets: 250 10*3/uL (ref 150–400)
RBC: 4.23 MIL/uL (ref 3.87–5.11)
RDW: 14.5 % (ref 11.5–15.5)
WBC: 7.2 10*3/uL (ref 4.0–10.5)
nRBC: 0 % (ref 0.0–0.2)

## 2019-08-06 LAB — IRON AND TIBC
Iron: 62 ug/dL (ref 41–142)
Saturation Ratios: 18 % — ABNORMAL LOW (ref 21–57)
TIBC: 336 ug/dL (ref 236–444)
UIBC: 274 ug/dL (ref 120–384)

## 2019-08-06 LAB — COMPREHENSIVE METABOLIC PANEL
ALT: 12 U/L (ref 0–44)
AST: 17 U/L (ref 15–41)
Albumin: 3.8 g/dL (ref 3.5–5.0)
Alkaline Phosphatase: 83 U/L (ref 38–126)
Anion gap: 8 (ref 5–15)
BUN: 13 mg/dL (ref 6–20)
CO2: 27 mmol/L (ref 22–32)
Calcium: 9.5 mg/dL (ref 8.9–10.3)
Chloride: 104 mmol/L (ref 98–111)
Creatinine, Ser: 0.89 mg/dL (ref 0.44–1.00)
GFR calc Af Amer: >60 mL/min (ref >60)
GFR calc non Af Amer: >60 mL/min (ref >60)
Glucose, Bld: 86 mg/dL (ref 70–99)
Potassium: 3.7 mmol/L (ref 3.5–5.1)
Sodium: 139 mmol/L (ref 135–145)
Total Bilirubin: 0.4 mg/dL (ref 0.3–1.2)
Total Protein: 7.7 g/dL (ref 6.5–8.1)

## 2019-08-06 LAB — FERRITIN: Ferritin: 36 ng/mL (ref 11–307)

## 2019-08-06 NOTE — Telephone Encounter (Signed)
Scheduled appt per 10/1 los.  Spoke with pt and she is aware of her appt date and time.

## 2019-08-10 ENCOUNTER — Telehealth: Payer: Self-pay | Admitting: Hematology

## 2019-08-10 ENCOUNTER — Other Ambulatory Visit: Payer: Self-pay | Admitting: Hematology

## 2019-08-10 DIAGNOSIS — Z1231 Encounter for screening mammogram for malignant neoplasm of breast: Secondary | ICD-10-CM

## 2019-08-10 NOTE — Telephone Encounter (Signed)
Scheduled appt per 10/2 sch message - pt aware of appt date and time

## 2019-08-16 ENCOUNTER — Ambulatory Visit
Admission: RE | Admit: 2019-08-16 | Discharge: 2019-08-16 | Disposition: A | Discharge status: Home / Self Care | Payer: Managed Care, Other (non HMO) | Source: Ambulatory Visit | Attending: Hematology | Admitting: Hematology

## 2019-08-16 ENCOUNTER — Other Ambulatory Visit: Payer: Self-pay

## 2019-08-16 ENCOUNTER — Other Ambulatory Visit: Payer: Managed Care, Other (non HMO)

## 2019-08-16 DIAGNOSIS — Z1231 Encounter for screening mammogram for malignant neoplasm of breast: Secondary | ICD-10-CM

## 2019-08-16 MED ORDER — GADOBUTROL 1 MMOL/ML IV SOLN
6.0000 mL | Freq: Once | INTRAVENOUS | Status: AC | PRN
  Administered 2019-08-16: 15:00:00 6 mL via INTRAVENOUS

## 2019-08-17 ENCOUNTER — Other Ambulatory Visit: Payer: Self-pay | Admitting: Hematology

## 2019-08-17 DIAGNOSIS — Z853 Personal history of malignant neoplasm of breast: Secondary | ICD-10-CM

## 2019-08-17 DIAGNOSIS — N6310 Unspecified lump in the right breast, unspecified quadrant: Secondary | ICD-10-CM

## 2019-08-18 ENCOUNTER — Telehealth: Payer: Self-pay | Admitting: Hematology

## 2019-08-18 ENCOUNTER — Telehealth: Payer: Self-pay

## 2019-08-18 ENCOUNTER — Other Ambulatory Visit: Payer: Self-pay | Admitting: Hematology

## 2019-08-18 ENCOUNTER — Telehealth: Payer: Self-pay | Admitting: Genetic Counselor

## 2019-08-18 DIAGNOSIS — N6310 Unspecified lump in the right breast, unspecified quadrant: Secondary | ICD-10-CM

## 2019-08-18 NOTE — Telephone Encounter (Signed)
Called patient regarding upcoming Web[facility] appointment, patient is notified and e-mail has been sent. °

## 2019-08-18 NOTE — Telephone Encounter (Signed)
Spoke with patient per Dr. Burr Medico with breast MRI results, notified her that there is 74mm mass in inner right breast that they are recommending she have a biopsy.  The patient verbalized an understanding and will call [facility] if she does not hear from them today to get this scheduled.

## 2019-08-19 ENCOUNTER — Inpatient Hospital Stay (HOSPITAL_BASED_OUTPATIENT_CLINIC_OR_DEPARTMENT_OTHER): Payer: Managed Care, Other (non HMO) | Admitting: Genetic Counselor

## 2019-08-19 ENCOUNTER — Other Ambulatory Visit: Payer: Self-pay | Admitting: Genetic Counselor

## 2019-08-19 DIAGNOSIS — Z8042 Family history of malignant neoplasm of prostate: Secondary | ICD-10-CM

## 2019-08-19 DIAGNOSIS — Z853 Personal history of malignant neoplasm of breast: Secondary | ICD-10-CM | POA: Diagnosis not present

## 2019-08-19 DIAGNOSIS — Z1379 Encounter for other screening for genetic and chromosomal anomalies: Secondary | ICD-10-CM

## 2019-08-19 DIAGNOSIS — Z800 Family history of malignant neoplasm of digestive organs: Secondary | ICD-10-CM

## 2019-08-20 ENCOUNTER — Encounter: Payer: Self-pay | Admitting: Genetic Counselor

## 2019-08-20 DIAGNOSIS — Z8042 Family history of malignant neoplasm of prostate: Secondary | ICD-10-CM | POA: Insufficient documentation

## 2019-08-20 DIAGNOSIS — Z800 Family history of malignant neoplasm of digestive organs: Secondary | ICD-10-CM | POA: Insufficient documentation

## 2019-08-20 NOTE — Progress Notes (Signed)
REFERRING PROVIDER: Truitt Merle, MD [place],  [place] [address]  PRIMARY PROVIDER:  Lucianne Lei, MD  PRIMARY REASON FOR VISIT:  1. History of left breast cancer   2. Family history of colon cancer   3. Family history of prostate cancer      HISTORY OF PRESENT ILLNESS:   Tara Bell, a 53 y.o. female, was seen for a [place] cancer genetics consultation at the request of Dr. Burr Medico due to a personal and family history of cancer.  Tara Bell presents to clinic today to discuss the possibility of a hereditary predisposition to cancer, genetic testing, and to further clarify her future cancer risks, as well as potential cancer risks for family members.   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 The treatment plan partial mastectomy, chemotherapy and radiation.  She underwent genetic testing through [facility] in 2010 that found a BRCA2 VUS.  In 2014, the patient had additional testing through the [facility] 25-gene panel, that found the BRCA2 VUS, as well as a PTEN and TP53 VUS.      CANCER HISTORY:  Oncology History  History of left breast cancer  07/06/2001 Initial Diagnosis   Breast cancer ([place])   02/13/2013 Genetic Testing   -BRCA2 VUS c.9565C>G, PTEN c.802-51_802-14del, and TP53 c.97-6C>T, all are VUSs found on Myrisk testing.  The [facility] gene panel offered by [employer] includes sequencing and deletion/duplication testing of the following 25 genes: APC, ATM, BARD1, BMPR1A, BRCA1, BRCA2, BRIP1, CHD1, CDK4, CDKN2A, CHEK2, EPCAM (large rearrangement only), MLH1, MSH2, MSH6, MUTYH, NBN, PALB2, PMS2, PTEN, RAD51C, RAD51D, SMAD4, STK11, and TP53.  The report date is February 13, 2013.   12/24/2017 Mammogram   12/24/2017 Mammogram IMPRESSION: No mammographic evidence of malignancy. A result letter of this screening mammogram will be mailed directly to the patient.     01/09/2018 Breast MRI   01/09/2018 MRI Breast IMPRESSION: No MRI evidence of malignancy. Previously biopsied fibroadenoma in the right breast.      RISK FACTORS:  Menarche was at age 41.  First live birth at age 15.  OCP use for approximately 8 years.  Ovaries intact: yes.  Hysterectomy: no.  Menopausal status: perimenopausal.  HRT use: 0 years. Colonoscopy: yes; 1 polyp found.  Scheduled for 5 years. Mammogram within the last year: yes. Number of breast biopsies: 2. Up to date with pelvic exams: yes. Any excessive radiation exposure in the past: yes  Past Medical History:  Diagnosis Date  . BRCA1 negative 08/2011  . 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 Breast cancer ([place]) SEPTEMBER 2002   LEFT BREAST-SURGERY,CHEMO,RADIATION-DR.LIVESAY  . Family history of colon cancer   . Family history of prostate cancer   . Hypertension   . 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 Lymphedema of arm    DUE TO LEFT   AXILLARY NODE DISSECTION   AND RADIATION  . Migraines   . S/P cardiac catheterization, with normal coronary arteries and normal EF, 07/01/13 07/13/2013    Past Surgical History:  Procedure Laterality Date  . AUGMENTATION MAMMAPLASTY  2008  . BILATERAL TUBAL    . BREAST BIOPSY    . BREAST LUMPECTOMY Left 2002   AE 36  . CARDIAC CATHETERIZATION  07/01/13   Angiographically normal coronary arteries with normal left atrial function  . HYSTEROSCOPY  12/10/07   RESECTOSCOPIC POLYPECTOMY/MYOMECTOMY  . LEFT HEART CATHETERIZATION WITH CORONARY ANGIOGRAM N/A 07/01/2013   Procedure: LEFT HEART CATHETERIZATION WITH CORONARY ANGIOGRAM;  Surgeon: Leonie Man, MD;  Location: [facility] CATH LAB;  Service: Cardiovascular;  Laterality: N/A;  . MASTECTOMY, PARTIAL  07/23/2001   LEFT BREAST WITH AXILLARY NODE EVALUATION  . TUBAL LIGATION     BILATERAL    Social History   Socioeconomic History  . Marital status: Divorced    Spouse name: Not on  file  . Number of children: Not on file  . Years of education: Not on file  . Highest education level: Not on file  Occupational History  . Not on file  Social Needs  . Financial resource strain: Not on file  . Food insecurity    Worry: Not on file    Inability: Not on file  . Transportation needs    Medical: Not on file    Non-medical: Not on file  Tobacco Use  . Smoking status: Never Smoker  . Smokeless tobacco: Never Used  Substance and Sexual Activity  . Alcohol use: Yes    Comment: RARE  . Drug use: No  . Sexual activity: Yes    Birth control/protection: None    Comment: TUBAL LIGATION  Lifestyle  . Physical activity    Days per week: Not on file    Minutes per session: Not on file  . Stress: Not on file  Relationships  . Social Herbalist on phone: Not on file    Gets together: Not on file    Attends religious service: Not on file    Active member of club or organization: Not on file    Attends meetings of clubs or organizations: Not on file    Relationship status: Not on file  Other Topics Concern  . Not on file  Social History Narrative  . Not on file     FAMILY HISTORY:  We obtained a detailed, 4-generation family history.  Significant diagnoses are listed below: Family History  Problem Relation Age of Onset  . Hypertension Mother   . Heart disease Mother   . Hypertension Father   . Prostate cancer Father 63  . Cancer Maternal Aunt        unknown type  . Stomach cancer Paternal Uncle        d. 64  . Leukemia Paternal Uncle   . Hypertension Sister   . Colon cancer Sister 64  . Hypertension Sister     The patient has two daughters who are cancer free.  She has two brothers and four sisters.  Her younger sister was diagnosed with colon cancer in Jan 16, 2017 at the age of 32 and died at 13.  Both parents are deceased.  The patient's mother died of a heart attack at 96.  She had three brothers and four sisters. One sister died of an unknown  cancer.  The maternal grandparents are deceased from unknown reasons.  The patient's father died at 43.  He had prostate cancer before he passed away diagnosed around age 33.  He had two brothers and three sisters.  One brother died of stomach cancer at 52 and the other brother had leukemia at an older age.  Tara Bell is unaware of previous family history of genetic testing for hereditary cancer risks. Patient's maternal ancestors are of African American descent, and paternal ancestors are of African American descent. There is no reported Ashkenazi Jewish ancestry. There is  no known consanguinity.  GENETIC COUNSELING ASSESSMENT: Tara Bell is a 53 y.o. female with a personal and family history of cancer which is somewhat suggestive of a hereditary cancer syndrome and predisposition to cancer given her young age of onset of breast cancer at 26 and her sister's young age of onset of colon cancer at 68. We, therefore, discussed and recommended the following at today's visit.   DISCUSSION: We discussed that 5 - 10% of breast cancer is hereditary, with most cases associated with BRCA mutations.  There are other genes that can be associated with hereditary breast cancer syndromes.  These include ATM, CHEK2 and PALB2.  Based on her previous genetic testing, there is a good chance that her testing will be negative. However, there is new technology by using RNA to look for deep intronic variants that may not be detected by the gold standard DNA testing.  We discussed that it seems that every 7-10 years, new technology increases our knowledge about new mutations.  We discussed that testing is beneficial for several reasons including knowing how to follow individuals after completing their treatment, identifying whether potential treatment options such as PARP inhibitors would be beneficial, and understand if other family members could be at risk for cancer and allow them to undergo genetic testing.   We  reviewed the characteristics, features and inheritance patterns of hereditary cancer syndromes. We also discussed genetic testing, including the appropriate family members to test, the process of testing, insurance coverage and turn-around-time for results. We discussed the implications of a negative, positive, carrier and/or variant of uncertain significant result. We recommended Tara Bell pursue genetic testing for the CancerNext-Expanded+RNAinsight gene panel. The CancerNext-Expanded gene panel offered by [facility] and includes sequencing and rearrangement analysis for the following 67 genes: AIP, ALK, APC*, ATM*, BAP1, BARD1, BLM, BMPR1A, BRCA1*, BRCA2*, BRIP1*, CDH1*, CDK4, CDKN1B, CDKN2A, CHEK2*, DICER1, FANCC, FH, FLCN, GALNT12, HOXB13, MAX, MEN1, MET, MLH1*, MRE11A, MSH2*, MSH6*, MUTYH*, NBN, NF1*, NF2, PALB2*, PHOX2B, PMS2*, POLD1, POLE, POT1, PRKAR1A, PTCH1, PTEN*, RAD50, RAD51C*, RAD51D*, RB1, RET, SDHA, SDHAF2, SDHB, SDHC, SDHD, SMAD4, SMARCA4, SMARCB1, SMARCE1, STK11, SUFU, TMEM127, TP53*, TSC1, TSC2, VHL and XRCC2 (sequencing and deletion/duplication); MITF (sequencing only); EPCAM and GREM1 (deletion/duplication only). DNA and RNA analyses performed for * genes.   Based on Tara Bell's personal and family history of cancer, she meets medical criteria for genetic testing. Despite that she meets criteria, she may still have an out of pocket cost. We discussed that if her out of pocket cost for testing is over $100, the laboratory will call and confirm whether she wants to proceed with testing.  If the out of pocket cost of testing is less than $100 she will be billed by the genetic testing laboratory.   PLAN: After considering the risks, benefits, and limitations, Tara Bell provided informed consent to pursue genetic testing.  She will come in for testing on October 20 and the blood sample was will be sent to [facility] for analysis of the CancerNext-Expanded+RNAinsight.  Results should be available within approximately 2-3 weeks' time, at which point they will be disclosed by telephone to Tara Bell, as will any additional recommendations warranted by these results. Tara Bell will receive a summary of her genetic counseling visit and a copy of her results once available. This information will also be available in Epic.   Lastly, we encouraged Tara Bell to remain in contact with cancer genetics annually so that we can continuously update the family history and inform  her of any changes in cancer genetics and testing that may be of benefit for this family.   Tara Bell questions were answered to her satisfaction today. Our contact information was provided should additional questions or concerns arise. Thank you for the referral and allowing [place] to share in the care of your patient.   Kolbe Delmonaco P. Florene Glen, [place], [facility] Licensed, Insurance risk surveyor [email or web address] phone: [phone]  The patient was seen for a total of 45 minutes in face-to-face genetic counseling.  This patient was discussed with Drs. Magrinat, Lindi Adie and/or Burr Medico who agrees with the above.    _______________________________________________________________________ For Office Staff:  Number of people involved in session: 1 Was an Intern/ student involved with case: yes Delorise Royals

## 2019-08-24 ENCOUNTER — Telehealth: Payer: Self-pay | Admitting: Hematology

## 2019-08-24 NOTE — Telephone Encounter (Signed)
Confirmed new lab time for 10/20 with patient.

## 2019-08-25 ENCOUNTER — Other Ambulatory Visit: Payer: Self-pay

## 2019-08-25 ENCOUNTER — Inpatient Hospital Stay: Payer: Managed Care, Other (non HMO)

## 2019-08-25 DIAGNOSIS — Z853 Personal history of malignant neoplasm of breast: Secondary | ICD-10-CM

## 2019-09-01 ENCOUNTER — Ambulatory Visit
Admission: RE | Admit: 2019-09-01 | Discharge: 2019-09-01 | Disposition: A | Discharge status: Home / Self Care | Payer: Managed Care, Other (non HMO) | Source: Ambulatory Visit | Attending: Hematology | Admitting: Hematology

## 2019-09-01 ENCOUNTER — Other Ambulatory Visit: Payer: Self-pay | Admitting: General Practice

## 2019-09-01 ENCOUNTER — Other Ambulatory Visit: Payer: Self-pay

## 2019-09-01 DIAGNOSIS — N6310 Unspecified lump in the right breast, unspecified quadrant: Secondary | ICD-10-CM

## 2019-09-01 MED ORDER — GADOBUTROL 1 MMOL/ML IV SOLN
6.0000 mL | Freq: Once | INTRAVENOUS | Status: AC | PRN
  Administered 2019-09-01: 6 mL via INTRAVENOUS

## 2019-09-02 ENCOUNTER — Telehealth: Payer: Self-pay | Admitting: Hematology

## 2019-09-02 NOTE — Telephone Encounter (Signed)
Due to the abnormal findings on screening breast MRI, patient underwent right breast biopsy on September 01, 2019.  The biopsy showed atypical ductal hyperplasia, negative for malignancy.  I called patient, discussed the biopsy results.  We discussed that it is a benign breast disease, however it significantly increased risk of future breast cancer.  We briefly reviewed the role of chemoprevention with antiestrogen therapy for ADH.  She is scheduled to see Dr. Dalbert Batman in the near future, and surgery may be recommended. I will copy Dr. Dalbert Batman, and plan to see her back after surgery to discuss antiestrogen therapy if she is interested. She will think about it.   Tara Bell  09/02/2019

## 2019-09-08 ENCOUNTER — Encounter: Payer: Self-pay | Admitting: Women's Health

## 2019-09-09 ENCOUNTER — Other Ambulatory Visit: Payer: Self-pay | Admitting: General Surgery

## 2019-09-09 DIAGNOSIS — N6091 Unspecified benign mammary dysplasia of right breast: Secondary | ICD-10-CM

## 2019-09-16 ENCOUNTER — Other Ambulatory Visit: Payer: Self-pay | Admitting: General Surgery

## 2019-09-16 DIAGNOSIS — N6091 Unspecified benign mammary dysplasia of right breast: Secondary | ICD-10-CM

## 2019-09-18 ENCOUNTER — Telehealth: Payer: Self-pay | Admitting: Genetic Counselor

## 2019-09-18 ENCOUNTER — Ambulatory Visit: Payer: Self-pay | Admitting: Genetic Counselor

## 2019-09-18 DIAGNOSIS — Z1379 Encounter for other screening for genetic and chromosomal anomalies: Secondary | ICD-10-CM

## 2019-09-18 NOTE — Progress Notes (Signed)
HPI:  Ms. Deegan was previously seen in the [facility] clinic due to a personal and family history of cancer and concerns regarding a hereditary predisposition to cancer. Please refer to our prior cancer genetics clinic note for more information regarding our discussion, assessment and recommendations, at the time. Ms. Mendonca recent genetic test results were disclosed to her, as were recommendations warranted by these results. These results and recommendations are discussed in more detail below.  CANCER HISTORY:  Oncology History  History of left breast cancer  07/06/2001 Initial Diagnosis   Breast cancer ([place])   02/13/2013 Genetic Testing   -BRCA2 VUS c.9565C>G, PTEN c.802-51_802-14del, and TP53 c.97-6C>T, all are VUSs found on Myrisk testing.  The [facility] gene panel offered by [employer] includes sequencing and deletion/duplication testing of the following 25 genes: APC, ATM, BARD1, BMPR1A, BRCA1, BRCA2, BRIP1, CHD1, CDK4, CDKN2A, CHEK2, EPCAM (large rearrangement only), MLH1, MSH2, MSH6, MUTYH, NBN, PALB2, PMS2, PTEN, RAD51C, RAD51D, SMAD4, STK11, and TP53.  The report date is February 13, 2013.   12/24/2017 Mammogram   12/24/2017 Mammogram IMPRESSION: No mammographic evidence of malignancy. A result letter of this screening mammogram will be mailed directly to the patient.    February 02, 2018 Breast MRI   2018/02/02 MRI Breast IMPRESSION: No MRI evidence of malignancy. Previously biopsied fibroadenoma in the right breast.     FAMILY HISTORY:  We obtained a detailed, 4-generation family history.  Significant diagnoses are listed below: Family History  Problem Relation Age of Onset   Hypertension Mother    Heart disease Mother    Hypertension Father    Prostate cancer Father 31   Cancer Maternal Aunt        unknown type   Stomach cancer Paternal Uncle        d. 65   Leukemia Paternal Uncle    Hypertension Sister    Colon cancer Sister 74    Hypertension Sister     The patient has two daughters who are cancer free.  She has two brothers and four sisters.  Her younger sister was diagnosed with colon cancer in February 02, 2017 at the age of 65 and died at 40.  Both parents are deceased.  The patient's mother died of a heart attack at 47.  She had three brothers and four sisters. One sister died of an unknown cancer.  The maternal grandparents are deceased from unknown reasons.  The patient's father died at 62.  He had prostate cancer before he passed away diagnosed around age 67.  He had two brothers and three sisters.  One brother died of stomach cancer at 103 and the other brother had leukemia at an older age.  Ms. Iglehart is unaware of previous family history of genetic testing for hereditary cancer risks. Patient's maternal ancestors are of African American descent, and paternal ancestors are of African American descent. There is no reported Ashkenazi Jewish ancestry. There is no known consanguinity.    GENETIC TEST RESULTS: Genetic testing reported out on September 18, 2019 through the CancerNext-Expanded+RNAinsight cancer panel found no pathogenic mutations. The CancerNext-Expanded gene panel offered by [facility] and includes sequencing and rearrangement analysis for the following 77 genes: AIP, ALK, APC*, ATM*, AXIN2, BAP1, BARD1, BLM, BMPR1A, BRCA1*, BRCA2*, BRIP1*, CDC73, CDH1*, CDK4, CDKN1B, CDKN2A, CHEK2*, CTNNA1, DICER1, FANCC, FH, FLCN, GALNT12, KIF1B, LZTR1, MAX, MEN1, MET, MLH1*, MSH2*, MSH3, MSH6*, MUTYH*, NBN, NF1*, NF2, NTHL1, PALB2*, PHOX2B, PMS2*, POT1, PRKAR1A, PTCH1, PTEN*, RAD51C*, RAD51D*, RB1, RECQL, RET, SDHA, SDHAF2, SDHB, SDHC,  SDHD, SMAD4, SMARCA4, SMARCB1, SMARCE1, STK11, SUFU, TMEM127, TP53*, TSC1, TSC2, VHL and XRCC2 (sequencing and deletion/duplication); EGFR, EGLN1, HOXB13, KIT, MITF, PDGFRA, POLD1, and POLE (sequencing only); EPCAM and GREM1 (deletion/duplication only). DNA and RNA analyses performed for *  genes. The test report has been scanned into EPIC and is located under the Molecular Pathology section of the Results Review tab.  A portion of the result report is included below for reference.     We discussed with Ms. Cercone that because current genetic testing is not perfect, it is possible there may be a gene mutation in one of these genes that current testing cannot detect, but that chance is small.  We also discussed, that there could be another gene that has not yet been discovered, or that we have not yet tested, that is responsible for the cancer diagnoses in the family. It is also possible there is a hereditary cause for the cancer in the family that Ms. Guess did not inherit and therefore was not identified in her testing.  Therefore, it is important to remain in touch with cancer genetics in the future so that we can continue to offer Ms. Helman the most up to date genetic testing.   \Genetic testing did identify a variant of uncertain significance (VUS) was identified in the BRCA2 gene called p.P3189A.  At this time, it is unknown if this variant is associated with increased cancer risk or if this is a normal finding, but most variants such as this get reclassified to being inconsequential. It should not be used to make medical management decisions. With time, we suspect the lab will determine the significance of this variant, if any. If we do learn more about it, we will try to contact Ms. Llewellyn to discuss it further. However, it is important to stay in touch with [place] periodically and keep the address and phone number up to date.  CANCER SCREENING RECOMMENDATIONS: Ms. Alpert test result is considered negative (normal).  This means that we have not identified a hereditary cause for her personal and family history of cancer at this time. Most cancers happen by chance and this negative test suggests that her cancer may fall into this category.    While reassuring, this does  not definitively rule out a hereditary predisposition to cancer. It is still possible that there could be genetic mutations that are undetectable by current technology. There could be genetic mutations in genes that have not been tested or identified to increase cancer risk.  Therefore, it is recommended she continue to follow the cancer management and screening guidelines provided by her oncology and primary healthcare provider.   An individual's cancer risk and medical management are not determined by genetic test results alone. Overall cancer risk assessment incorporates additional factors, including personal medical history, family history, and any available genetic information that may result in a personalized plan for cancer prevention and surveillance  FOLLOW-UP: Lastly, we discussed with Ms. Doucet that cancer genetics is a rapidly advancing field and it is possible that new genetic tests will be appropriate for her and/or her family members in the future. We encouraged her to remain in contact with cancer genetics on an annual basis so we can update her personal and family histories and let her know of advances in cancer genetics that may benefit this family.   Our contact number was provided. Ms. Tello questions were answered to her satisfaction, and she knows she is welcome to call us at anytime with  additional questions or concerns.   Roma Kayser, [place], [facility] Licensed, Certified Genetic Counselor [email or web address]

## 2019-09-18 NOTE — Telephone Encounter (Addendum)
Revealed negative genetic testing.  Discussed that we do not know why she has breast cancer or why there is cancer in the family. It could be due to a different gene that we are not testing, or maybe our current technology may not be able to pick something up.  It will be important for her to keep in contact with genetics to keep up with whether additional testing may be needed.  Explained that the previously identified BRCA2 VUS was seen again.  The other two VUS in PTEN and TP53 are considered benign.

## 2019-09-30 NOTE — Pre-Procedure Instructions (Signed)
[place] ([address]), [place] [id number] [place] ([place]) [place] [address] Phone: [phone] Fax: [phone]      Your procedure is scheduled on  10-06-19 Tuesday 0930-1030  Report to [facility] Main Entrance "A" at 0730A.M., and check in at the Admitting office.  Call this number if you have problems the morning of surgery:  [phone]  Call [phone] if you have any questions prior to your surgery date Monday-Friday 8am-4pm    Remember:  Do not eat  after midnight the night before your surgery  You may drink clear liquids until 0630AM the morning of your surgery.   Clear liquids allowed are: Water, Non-Citrus Juices (without pulp), Carbonated Beverages, Clear Tea, Black Coffee Only, and Gatorade   Take these medicines the morning of surgery with A SIP OF WATER : metoprolol tartrate (LOPRESSOR) baclofen (LIORESAL) as needed valACYclovir (VALTREX)as needed zonisamide (ZONEGRAN)   7 days prior to surgery STOP taking any Aspirin (unless otherwise instructed by your surgeon), Aleve, Naproxen, Ibuprofen, Motrin, Advil, Goody's, BC's, all herbal medications, fish oil, and all vitamins.    The Morning of Surgery  Do not wear jewelry, make-up or nail polish.  Do not wear lotions, powders, or perfumes/colognes, or deodorant  Do not shave 48 hours prior to surgery.    Do not bring valuables to the hospital.  [facility] is not responsible for any belongings or valuables.  If you are a smoker, DO NOT Smoke 24 hours prior to surgery  If you wear a CPAP at night please bring your mask, tubing, and machine the morning of surgery   Remember that you must have someone to transport you home after your surgery, and remain with you for 24 hours if you are discharged the same day.   Please bring cases for contacts, glasses, hearing aids, dentures or bridgework because it cannot be worn into surgery.    Leave your suitcase in the car.   After surgery it may be brought to your room.  For patients admitted to the hospital, discharge time will be determined by your treatment team.  Patients discharged the day of surgery will not be allowed to drive home.    Special instructions:   [place]- Preparing For Surgery  Before surgery, you can play an important role. Because skin is not sterile, your skin needs to be as free of germs as possible. You can reduce the number of germs on your skin by washing with CHG (chlorahexidine gluconate) Soap before surgery.  CHG is an antiseptic cleaner which kills germs and bonds with the skin to continue killing germs even after washing.    Oral Hygiene is also important to reduce your risk of infection.  Remember - BRUSH YOUR TEETH THE MORNING OF SURGERY WITH YOUR REGULAR TOOTHPASTE  Please do not use if you have an allergy to CHG or antibacterial soaps. If your skin becomes reddened/irritated stop using the CHG.  Do not shave (including legs and underarms) for at least 48 hours prior to first CHG shower. It is OK to shave your face.  Please follow these instructions carefully.   1. Shower the NIGHT BEFORE SURGERY and the MORNING OF SURGERY with CHG Soap.   2. If you chose to wash your hair, wash your hair first as usual with your normal shampoo.  3. After you shampoo, rinse your hair and body thoroughly to remove the shampoo.  4. Use CHG as you would any  other liquid soap. You can apply CHG directly to the skin and wash gently with a scrungie or a clean washcloth.   5. Apply the CHG Soap to your body ONLY FROM THE NECK DOWN.  Do not use on open wounds or open sores. Avoid contact with your eyes, ears, mouth and genitals (private parts). Wash Face and genitals (private parts)  with your normal soap.   6. Wash thoroughly, paying special attention to the area where your surgery will be performed.  7. Thoroughly rinse your body with warm water from the neck down.  8. DO NOT shower/wash  with your normal soap after using and rinsing off the CHG Soap.  9. Pat yourself dry with a CLEAN TOWEL.  10. Wear CLEAN PAJAMAS to bed the night before surgery, wear comfortable clothes the morning of surgery  11. Place CLEAN SHEETS on your bed the night of your first shower and DO NOT SLEEP WITH PETS.   Day of Surgery:  Please shower the morning of surgery with the CHG soap Do not apply any deodorants/lotions. Please wear clean clothes to the hospital/surgery center.   Remember to brush your teeth WITH YOUR REGULAR TOOTHPASTE.   Please read over the  fact sheets that you were given.

## 2019-10-01 NOTE — H&P (Signed)
Tara Bell Location: 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 Tara Bell is her PCP.      Past history is significant for cancer of the left breast in 2002. Tara Bell performed left partial mastectomy, Port-A-Cath insertion and axillary lymph node dissection. 9 out of 14 nodes were positive. Tumor was 2.5 cm. 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 She received chemotherapy and radiation therapy to the breast axilla and supraclavicular area. Tara Bell was her oncologist. There has been no local recurrence. 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 She had a core biopsy on the right in 2017 which was a benign fibroadenoma Mammograms on January 21, 2019*category 1. Because of high breast density Tara Bell at the [facility] sent her for MRI on August 16, 2019 and this shows a 5 mm mass on the right side, lower inner quadrant, middle depth. The left breast looks fine MRI guided Core biopsy of the right shows atypical ductal hyperplasia      Past history significant for right breast cancer 2002 with surgery, chemotherapy, radiation therapy. No evidence of recurrence next fibroadenoma right breast biopsy 2017, uterine fibroids, significant size, and anemia. Left upper extremity lymphedema. Bilateral subpectoral saline implants by Tara Bell next line family history is positive for colon cancer in a sister who died. Prostate cancer in her father. Gastric cancer in a paternal uncle. There is no family history of breast or ovarian cancer. She works for [employer] in [place]. Denies tobacco. Takes alcohol occasionally      I  explained ADH to her. Certainly this is premalignant and could lead to cancer in that area of the breast. Additionally this is a high risk lesion with 15% or so risk of upgrade on excision at this time. We talked about this for a long time. I advised excision of this area with radioactive seed localization and she completely agrees She'll be scheduled for right breast lumpectomy with radioactive seed localization. We will do this before Thanksgiving I discussed the indications, details, techniques, and numerous risk of the surgery with her. She is aware of the risks of bleeding, infection, injury and removal of the implant, cosmetic deformity, chronic pain, chronic numbness, asymmetry, reoperation of cancer. She understands all these issues. All her questions were answered. She agrees with this plan.      Past Surgical History Breast Augmentation  Bilateral. Breast Biopsy  Bilateral. multiple Breast Mass; Local Excision  Left.  Diagnostic Studies History Colonoscopy  1-5 years ago never Mammogram  within last year Pap Smear  1-5 years ago  Allergies  Nitrofurantoin *URINARY ANTI-INFECTIVES*  Allergies Reconciled   Medication History  Aspirin (81MG Tablet Chewable, Oral) Active. Metoprolol Succinate ER (25MG Tablet ER 24HR, Oral) Active. Tribenzor (20-5-12.5MG Tablet, Oral) Active. Nitrostat (0.4MG Tab Sublingual, Sublingual) Active. ValACYclovir HCl (500MG Tablet, Oral) Active. Baclofen (10MG Tablet, Oral) Active. Zonisamide (25MG Capsule, Oral) Active. Medications Reconciled  Social History  Alcohol use  Occasional alcohol use. Caffeine use  Carbonated beverages, Coffee, Tea. No drug use  Tobacco use  Never smoker.  Family History  Colon Cancer  Sister. Heart Disease  Mother. Heart disease in female family member before age 21  Hypertension  Brother, Father, Mother, Sister. Migraine Headache  Sister. Prostate Cancer   Father. Respiratory Condition  Father.  Pregnancy / Birth History Age at menarche  54 years. Gravida  2 Maternal age  52-25 Para  2  Other Problems  Breast Cancer  Heart murmur  High blood pressure  Migraine Headache     Review of Systems  General Present- Night Sweats. Not Present- Appetite Loss, Chills, Fatigue, Fever, Weight Gain and Weight Loss. Skin Not Present- Change in Wart/Mole, Dryness, Hives, Jaundice, New Lesions, Non-Healing Wounds, Rash and Ulcer. HEENT Present- Wears glasses/contact lenses. Not Present- Earache, Hearing Loss, Hoarseness, Nose Bleed, Oral Ulcers, Ringing in the Ears, Seasonal Allergies, Sinus Pain, Sore Throat, Visual Disturbances and Yellow Eyes. Respiratory Not Present- Bloody sputum, Chronic Cough, Difficulty Breathing, Snoring and Wheezing. Cardiovascular Not Present- Chest Pain, Difficulty Breathing Lying Down, Leg Cramps, Palpitations, Rapid Heart Rate, Shortness of Breath and Swelling of Extremities. Gastrointestinal Not Present- Abdominal Pain, Bloating, Bloody Stool, Change in Bowel Habits, Chronic diarrhea, Constipation, Difficulty Swallowing, Excessive gas, Gets full quickly at meals, Hemorrhoids, Indigestion, Nausea, Rectal Pain and Vomiting. Female Genitourinary Not Present- Frequency, Nocturia, Painful Urination, Pelvic Pain and Urgency. Musculoskeletal Present- Joint Stiffness. Not Present- Back Pain, Joint Pain, Muscle Pain, Muscle Weakness and Swelling of Extremities. Neurological Present- Headaches. Not Present- Decreased Memory, Fainting, Numbness, Seizures, Tingling, Tremor, Trouble walking and Weakness. Psychiatric Not Present- Anxiety, Bipolar, Change in Sleep Pattern, Depression, Fearful and Frequent crying. Endocrine Not Present- Cold Intolerance, Excessive Hunger, Hair Changes, Heat Intolerance, Hot flashes and New Diabetes. Hematology Not Present- Blood Thinners, Easy Bruising, Excessive bleeding, Gland problems, HIV and  Persistent Infections.  Vitals  Weight: 144.8 lb Height: 60in Body Surface Area: 1.63 m Body Mass Index: 28.28 kg/m  Temp.: 97.29F  Pulse: 78 (Regular)  BP: 114/78 (Sitting, Left Arm, Standard)       Physical Exam  General Mental Status-Alert. General Appearance-Consistent with stated age. Hydration-Well hydrated. Voice-Normal.  Head and Neck Head-normocephalic, atraumatic with no lesions or palpable masses. Trachea-midline. Thyroid Gland Characteristics - normal size and consistency.  Eye Eyeball - Bilateral-Extraocular movements intact. Sclera/Conjunctiva - Bilateral-No scleral icterus.  Chest and Lung Exam Chest and lung exam reveals -quiet, even and easy respiratory effort with no use of accessory muscles and on auscultation, normal breath sounds, no adventitious sounds and normal vocal resonance. Inspection Chest Wall - Normal. Back - normal.  Breast Note: Breasts are symmetrical. Implants obviously palpable but soft. In the upper outer quadrant of the left breast there is an old lumpectomy scar as well as a left axillary scar well-healed. Both breasts have curvilinear circumareolar incisions at the lower rim of the umbilicus. All Port-A-Cath site in the right infraclavicular area. All biopsy site lateral right breast. I don't feel a mass. There are no other skin changes. There is no axillary adenopathy.   Cardiovascular Cardiovascular examination reveals -normal heart sounds, regular rate and rhythm with no murmurs and normal pedal pulses bilaterally.  Abdomen Inspection Inspection of the abdomen reveals - No Hernias. Skin - Scar - Note: Infraumbilical scar. Tubal ligation. Palpation/Percussion Palpation and Percussion of the abdomen reveal - Soft, Non Tender, No Rebound tenderness, No Rigidity (guarding) and No hepatosplenomegaly. Auscultation Auscultation of the abdomen reveals - Bowel sounds normal. Note: Uterus palpable.  Firm. 16 weeks size.   Neurologic Neurologic evaluation reveals -alert and oriented x 3 with no impairment of recent or remote memory. Mental Status-Normal.  Musculoskeletal Normal Exam - Left-Upper Extremity Strength Normal and Lower Extremity Strength Normal. Normal Exam - Right-Upper Extremity Strength Normal and Lower Extremity Strength Normal.  Lymphatic Head & Neck  General Head & Neck Lymphatics: Bilateral - Description - Normal. Axillary  General Axillary Region: Bilateral - Description - Normal. Tenderness - Non Tender. Femoral & Inguinal  Generalized Femoral & Inguinal Lymphatics: Bilateral - Description - Normal. Tenderness - Non Tender.    Assessment & Plan  ATYPICAL DUCTAL HYPERPLASIA OF RIGHT BREAST (N60.91)  you werte treated for cancer of the left breast in 2002 which included lumpectomy, lymph node biopsies, chemotherapy, and radiation therapy. There is no evidence of recurrence on the left side.  Your recent mammograms were normal. Because of breast density an MRI was performed, and this shows a 5 mm density in the right breast, lower inner quadrant This area was biopsied and shows atypical ductal hyperplasia Atypical ductal hyperplasia is a premalignant process and can develop into cancer Also, if we excise the entire area, there is a 15% chance that this could be upgraded to an early cancer at this time I strongly recommended this area be excised and you agree   you'll be scheduled for right breast lumpectomy with radioactive seed localization Discussed the indications, techniques, and numerous risk of the surgery with you in detail  HISTORY OF LEFT BREAST CANCER (Z85.3) HISTORY OF CHEMOTHERAPY (Z92.21) HISTORY OF RADIATION THERAPY (Z92.3) HISTORY OF BILATERAL BREAST IMPLANTS (I78.29) Impression: Tara Bell. Saline implants. Submuscular. FIBROIDS (D21.9) Impression: Uterine fibroids. Palpable uterus. Anemia.   Edsel Petrin. Dalbert Batman, M.D.,  [facility] General and Minimally invasive Surgery Breast and Colorectal Surgery

## 2019-10-02 ENCOUNTER — Other Ambulatory Visit (HOSPITAL_COMMUNITY)
Admission: RE | Admit: 2019-10-02 | Discharge: 2019-10-02 | Disposition: A | Discharge status: Home / Self Care | Payer: Managed Care, Other (non HMO) | Source: Ambulatory Visit | Attending: General Surgery | Admitting: General Surgery

## 2019-10-02 ENCOUNTER — Encounter (HOSPITAL_COMMUNITY)
Admission: RE | Admit: 2019-10-02 | Discharge: 2019-10-02 | Disposition: A | Discharge status: Home / Self Care | Payer: Managed Care, Other (non HMO) | Source: Ambulatory Visit | Attending: General Surgery | Admitting: General Surgery

## 2019-10-02 ENCOUNTER — Other Ambulatory Visit: Payer: Self-pay

## 2019-10-02 ENCOUNTER — Encounter (HOSPITAL_COMMUNITY): Payer: Self-pay

## 2019-10-02 DIAGNOSIS — Z01818 Encounter for other preprocedural examination: Secondary | ICD-10-CM | POA: Diagnosis present

## 2019-10-02 DIAGNOSIS — I10 Essential (primary) hypertension: Secondary | ICD-10-CM | POA: Diagnosis not present

## 2019-10-02 DIAGNOSIS — Z20828 Contact with and (suspected) exposure to other viral communicable diseases: Secondary | ICD-10-CM | POA: Insufficient documentation

## 2019-10-02 HISTORY — DX: Anemia, unspecified: D64.9

## 2019-10-02 LAB — CBC WITH DIFFERENTIAL/PLATELET
Abs Immature Granulocytes: 0.02 10*3/uL (ref 0.00–0.07)
Basophils Absolute: 0 10*3/uL (ref 0.0–0.1)
Basophils Relative: 0 %
Eosinophils Absolute: 0.1 10*3/uL (ref 0.0–0.5)
Eosinophils Relative: 2 %
HCT: 38.3 % (ref 36.0–46.0)
Hemoglobin: 12.4 g/dL (ref 12.0–15.0)
Immature Granulocytes: 0 %
Lymphocytes Relative: 28 %
Lymphs Abs: 1.9 10*3/uL (ref 0.7–4.0)
MCH: 29.8 pg (ref 26.0–34.0)
MCHC: 32.4 g/dL (ref 30.0–36.0)
MCV: 92.1 fL (ref 80.0–100.0)
Monocytes Absolute: 0.7 10*3/uL (ref 0.1–1.0)
Monocytes Relative: 10 %
Neutro Abs: 3.9 10*3/uL (ref 1.7–7.7)
Neutrophils Relative %: 60 %
Platelets: 295 10*3/uL (ref 150–400)
RBC: 4.16 MIL/uL (ref 3.87–5.11)
RDW: 14.5 % (ref 11.5–15.5)
WBC: 6.7 10*3/uL (ref 4.0–10.5)
nRBC: 0 % (ref 0.0–0.2)

## 2019-10-02 LAB — COMPREHENSIVE METABOLIC PANEL
ALT: 16 U/L (ref 0–44)
AST: 22 U/L (ref 15–41)
Albumin: 3.7 g/dL (ref 3.5–5.0)
Alkaline Phosphatase: 74 U/L (ref 38–126)
Anion gap: 10 (ref 5–15)
BUN: 12 mg/dL (ref 6–20)
CO2: 25 mmol/L (ref 22–32)
Calcium: 9.3 mg/dL (ref 8.9–10.3)
Chloride: 106 mmol/L (ref 98–111)
Creatinine, Ser: 0.9 mg/dL (ref 0.44–1.00)
GFR calc Af Amer: >60 mL/min (ref >60)
GFR calc non Af Amer: >60 mL/min (ref >60)
Glucose, Bld: 95 mg/dL (ref 70–99)
Potassium: 3.6 mmol/L (ref 3.5–5.1)
Sodium: 141 mmol/L (ref 135–145)
Total Bilirubin: 0.6 mg/dL (ref 0.3–1.2)
Total Protein: 7.5 g/dL (ref 6.5–8.1)

## 2019-10-02 LAB — SARS CORONAVIRUS 2 (TAT 6-24 HRS): SARS Coronavirus 2: NEGATIVE

## 2019-10-02 NOTE — Progress Notes (Signed)
PCP:  Dr. Beatrix Shipper Cardiologist:  Denies  EKG: 10/02/19 CXR:  N/A ECHO:  denies Stress Test:  denies Cardiac Cath:  Denies  Covid testing 10/02/19   Patient denies shortness of breath, fever, cough, and chest pain at PAT appointment.  Patient verbalized understanding of instructions provided today at the PAT appointment.  Patient asked to review instructions at home and day of surgery.

## 2019-10-05 ENCOUNTER — Ambulatory Visit
Admission: RE | Admit: 2019-10-05 | Discharge: 2019-10-05 | Disposition: A | Discharge status: Home / Self Care | Payer: Managed Care, Other (non HMO) | Source: Ambulatory Visit | Attending: General Surgery | Admitting: General Surgery

## 2019-10-05 ENCOUNTER — Other Ambulatory Visit: Payer: Self-pay

## 2019-10-05 DIAGNOSIS — N6091 Unspecified benign mammary dysplasia of right breast: Secondary | ICD-10-CM

## 2019-10-06 ENCOUNTER — Ambulatory Visit
Admission: RE | Admit: 2019-10-06 | Discharge: 2019-10-06 | Disposition: A | Discharge status: Home / Self Care | Payer: Managed Care, Other (non HMO) | Source: Ambulatory Visit | Attending: General Surgery | Admitting: General Surgery

## 2019-10-06 ENCOUNTER — Encounter (HOSPITAL_COMMUNITY): Payer: Self-pay

## 2019-10-06 ENCOUNTER — Encounter (HOSPITAL_COMMUNITY)
Admission: RE | Disposition: A | Discharge status: Home / Self Care | Payer: Self-pay | Source: Home / Self Care | Attending: General Surgery

## 2019-10-06 ENCOUNTER — Other Ambulatory Visit: Payer: Self-pay

## 2019-10-06 ENCOUNTER — Ambulatory Visit (HOSPITAL_COMMUNITY): Payer: Managed Care, Other (non HMO) | Admitting: Anesthesiology

## 2019-10-06 ENCOUNTER — Ambulatory Visit (HOSPITAL_COMMUNITY)
Admission: RE | Admit: 2019-10-06 | Discharge: 2019-10-06 | Disposition: A | Discharge status: Home / Self Care | Payer: Managed Care, Other (non HMO) | Attending: General Surgery | Admitting: General Surgery

## 2019-10-06 DIAGNOSIS — N6091 Unspecified benign mammary dysplasia of right breast: Secondary | ICD-10-CM

## 2019-10-06 DIAGNOSIS — Z9221 Personal history of antineoplastic chemotherapy: Secondary | ICD-10-CM | POA: Diagnosis not present

## 2019-10-06 DIAGNOSIS — Z853 Personal history of malignant neoplasm of breast: Secondary | ICD-10-CM | POA: Diagnosis not present

## 2019-10-06 DIAGNOSIS — Z800 Family history of malignant neoplasm of digestive organs: Secondary | ICD-10-CM | POA: Diagnosis not present

## 2019-10-06 DIAGNOSIS — Z923 Personal history of irradiation: Secondary | ICD-10-CM | POA: Diagnosis not present

## 2019-10-06 DIAGNOSIS — N6489 Other specified disorders of breast: Secondary | ICD-10-CM | POA: Diagnosis not present

## 2019-10-06 HISTORY — DX: Unspecified benign mammary dysplasia of right breast: N60.91

## 2019-10-06 HISTORY — PX: BREAST EXCISIONAL BIOPSY: SUR124

## 2019-10-06 HISTORY — PX: BREAST LUMPECTOMY WITH RADIOACTIVE SEED LOCALIZATION: SHX6424

## 2019-10-06 SURGERY — BREAST LUMPECTOMY WITH RADIOACTIVE SEED LOCALIZATION
Anesthesia: General | Site: Breast | Laterality: Right

## 2019-10-06 MED ORDER — DEXAMETHASONE SODIUM PHOSPHATE 10 MG/ML IJ SOLN
INTRAMUSCULAR | Status: AC
  Filled 2019-10-06: qty 1

## 2019-10-06 MED ORDER — HYDROCODONE-ACETAMINOPHEN 5-325 MG PO TABS: 1.0000 | ORAL_TABLET | Freq: Four times a day (QID) | ORAL | 0 refills | Status: DC | PRN

## 2019-10-06 MED ORDER — CHLORHEXIDINE GLUCONATE CLOTH 2 % EX PADS: 6.0000 | MEDICATED_PAD | Freq: Once | CUTANEOUS | Status: DC

## 2019-10-06 MED ORDER — SODIUM CHLORIDE 0.9% FLUSH: 3.0000 mL | INTRAVENOUS | Status: DC | PRN

## 2019-10-06 MED ORDER — PROPOFOL 10 MG/ML IV BOLUS
INTRAVENOUS | Status: DC | PRN
  Administered 2019-10-06: 180 mg via INTRAVENOUS

## 2019-10-06 MED ORDER — ACETAMINOPHEN 500 MG PO TABS
1000.0000 mg | ORAL_TABLET | ORAL | Status: AC
  Administered 2019-10-06: 1000 mg via ORAL
  Filled 2019-10-06: qty 2

## 2019-10-06 MED ORDER — PROPOFOL 10 MG/ML IV BOLUS
INTRAVENOUS | Status: AC
  Filled 2019-10-06: qty 20

## 2019-10-06 MED ORDER — SODIUM CHLORIDE 0.9 % IV SOLN: 250.0000 mL | INTRAVENOUS | Status: DC | PRN

## 2019-10-06 MED ORDER — DIPHENHYDRAMINE HCL 50 MG/ML IJ SOLN
INTRAMUSCULAR | Status: AC
  Filled 2019-10-06: qty 1

## 2019-10-06 MED ORDER — DIPHENHYDRAMINE HCL 50 MG/ML IJ SOLN
INTRAMUSCULAR | Status: DC | PRN
  Administered 2019-10-06: 12.5 mg via INTRAVENOUS

## 2019-10-06 MED ORDER — FENTANYL CITRATE (PF) 100 MCG/2ML IJ SOLN: 25.0000 ug | INTRAMUSCULAR | Status: DC | PRN

## 2019-10-06 MED ORDER — FENTANYL CITRATE (PF) 100 MCG/2ML IJ SOLN
INTRAMUSCULAR | Status: DC | PRN
  Administered 2019-10-06: 50 ug via INTRAVENOUS

## 2019-10-06 MED ORDER — LACTATED RINGERS IV SOLN
INTRAVENOUS | Status: DC
  Administered 2019-10-06 (×2): via INTRAVENOUS

## 2019-10-06 MED ORDER — BUPIVACAINE-EPINEPHRINE (PF) 0.5% -1:200000 IJ SOLN
INTRAMUSCULAR | Status: DC | PRN
  Administered 2019-10-06: 5 mL

## 2019-10-06 MED ORDER — OXYCODONE HCL 5 MG PO TABS: 5.0000 mg | ORAL_TABLET | ORAL | Status: DC | PRN

## 2019-10-06 MED ORDER — PHENYLEPHRINE 40 MCG/ML (10ML) SYRINGE FOR IV PUSH (FOR BLOOD PRESSURE SUPPORT)
PREFILLED_SYRINGE | INTRAVENOUS | Status: DC | PRN
  Administered 2019-10-06: 120 ug via INTRAVENOUS
  Administered 2019-10-06 (×3): 80 ug via INTRAVENOUS

## 2019-10-06 MED ORDER — MIDAZOLAM HCL 5 MG/5ML IJ SOLN
INTRAMUSCULAR | Status: DC | PRN
  Administered 2019-10-06: 2 mg via INTRAVENOUS

## 2019-10-06 MED ORDER — BUPIVACAINE-EPINEPHRINE 0.5% -1:200000 IJ SOLN
INTRAMUSCULAR | Status: AC
  Filled 2019-10-06: qty 1

## 2019-10-06 MED ORDER — GABAPENTIN 300 MG PO CAPS
300.0000 mg | ORAL_CAPSULE | ORAL | Status: AC
  Administered 2019-10-06: 300 mg via ORAL
  Filled 2019-10-06: qty 3
  Filled 2019-10-06: qty 1

## 2019-10-06 MED ORDER — LIDOCAINE 2% (20 MG/ML) 5 ML SYRINGE
INTRAMUSCULAR | Status: AC
  Filled 2019-10-06: qty 5

## 2019-10-06 MED ORDER — MIDAZOLAM HCL 2 MG/2ML IJ SOLN
INTRAMUSCULAR | Status: AC
  Filled 2019-10-06: qty 2

## 2019-10-06 MED ORDER — DEXAMETHASONE SODIUM PHOSPHATE 4 MG/ML IJ SOLN
INTRAMUSCULAR | Status: DC | PRN
  Administered 2019-10-06: 5 mg via INTRAVENOUS

## 2019-10-06 MED ORDER — SODIUM CHLORIDE 0.9% FLUSH: 3.0000 mL | Freq: Two times a day (BID) | INTRAVENOUS | Status: DC

## 2019-10-06 MED ORDER — ACETAMINOPHEN 650 MG RE SUPP: 650.0000 mg | RECTAL | Status: DC | PRN

## 2019-10-06 MED ORDER — CEFAZOLIN SODIUM-DEXTROSE 2-4 GM/100ML-% IV SOLN
2.0000 g | INTRAVENOUS | Status: AC
  Administered 2019-10-06: 2 g via INTRAVENOUS
  Filled 2019-10-06: qty 100

## 2019-10-06 MED ORDER — LIDOCAINE 2% (20 MG/ML) 5 ML SYRINGE
INTRAMUSCULAR | Status: DC | PRN
  Administered 2019-10-06: 60 mg via INTRAVENOUS

## 2019-10-06 MED ORDER — ACETAMINOPHEN 325 MG PO TABS: 650.0000 mg | ORAL_TABLET | ORAL | Status: DC | PRN

## 2019-10-06 MED ORDER — ONDANSETRON HCL 4 MG/2ML IJ SOLN
INTRAMUSCULAR | Status: AC
  Filled 2019-10-06: qty 2

## 2019-10-06 MED ORDER — BUPIVACAINE HCL (PF) 0.25 % IJ SOLN
INTRAMUSCULAR | Status: AC
  Filled 2019-10-06: qty 30

## 2019-10-06 MED ORDER — ONDANSETRON HCL 4 MG/2ML IJ SOLN
INTRAMUSCULAR | Status: DC | PRN
  Administered 2019-10-06: 4 mg via INTRAVENOUS

## 2019-10-06 MED ORDER — FENTANYL CITRATE (PF) 250 MCG/5ML IJ SOLN
INTRAMUSCULAR | Status: AC
  Filled 2019-10-06: qty 5

## 2019-10-06 MED ORDER — PHENYLEPHRINE 40 MCG/ML (10ML) SYRINGE FOR IV PUSH (FOR BLOOD PRESSURE SUPPORT)
PREFILLED_SYRINGE | INTRAVENOUS | Status: AC
  Filled 2019-10-06: qty 10

## 2019-10-06 MED ORDER — CELECOXIB 200 MG PO CAPS
200.0000 mg | ORAL_CAPSULE | ORAL | Status: AC
  Administered 2019-10-06: 200 mg via ORAL
  Filled 2019-10-06: qty 1

## 2019-10-06 MED ORDER — LACTATED RINGERS IV SOLN: INTRAVENOUS | Status: DC

## 2019-10-06 MED ORDER — 0.9 % SODIUM CHLORIDE (POUR BTL) OPTIME
TOPICAL | Status: DC | PRN
  Administered 2019-10-06: 1000 mL

## 2019-10-06 SURGICAL SUPPLY — 39 items
APPLIER CLIP 9.375 MED OPEN (MISCELLANEOUS) ×3
BINDER BREAST LRG (GAUZE/BANDAGES/DRESSINGS) IMPLANT
BINDER BREAST XLRG (GAUZE/BANDAGES/DRESSINGS) IMPLANT
CANISTER SUCT 3000ML PPV (MISCELLANEOUS) ×3 IMPLANT
CHLORAPREP W/TINT 26 (MISCELLANEOUS) ×3 IMPLANT
CLIP APPLIE 9.375 MED OPEN (MISCELLANEOUS) ×1 IMPLANT
COVER PROBE W GEL 5X96 (DRAPES) ×3 IMPLANT
COVER SURGICAL LIGHT HANDLE (MISCELLANEOUS) ×3 IMPLANT
COVER WAND RF STERILE (DRAPES) ×3 IMPLANT
DERMABOND ADVANCED (GAUZE/BANDAGES/DRESSINGS) ×2
DERMABOND ADVANCED .7 DNX12 (GAUZE/BANDAGES/DRESSINGS) ×1 IMPLANT
DEVICE DUBIN SPECIMEN MAMMOGRA (MISCELLANEOUS) ×3 IMPLANT
DRAPE CHEST BREAST 15X10 FENES (DRAPES) ×3 IMPLANT
DRSG PAD ABDOMINAL 8X10 ST (GAUZE/BANDAGES/DRESSINGS) ×3 IMPLANT
ELECT CAUTERY BLADE 6.4 (BLADE) ×3 IMPLANT
ELECT REM PT RETURN 9FT ADLT (ELECTROSURGICAL) ×3
ELECTRODE REM PT RTRN 9FT ADLT (ELECTROSURGICAL) ×1 IMPLANT
GAUZE SPONGE 4X4 12PLY STRL (GAUZE/BANDAGES/DRESSINGS) ×3 IMPLANT
GAUZE SPONGE 4X4 12PLY STRL LF (GAUZE/BANDAGES/DRESSINGS) ×3 IMPLANT
GLOVE SS BIOGEL STRL SZ 7 (GLOVE) ×2 IMPLANT
GLOVE SUPERSENSE BIOGEL SZ 7 (GLOVE) ×4
GOWN STRL REUS W/ TWL LRG LVL3 (GOWN DISPOSABLE) ×1 IMPLANT
GOWN STRL REUS W/ TWL XL LVL3 (GOWN DISPOSABLE) ×1 IMPLANT
GOWN STRL REUS W/TWL LRG LVL3 (GOWN DISPOSABLE) ×2
GOWN STRL REUS W/TWL XL LVL3 (GOWN DISPOSABLE) ×2
ILLUMINATOR WAVEGUIDE N/F (MISCELLANEOUS) IMPLANT
KIT BASIN OR (CUSTOM PROCEDURE TRAY) ×3 IMPLANT
KIT MARKER MARGIN INK (KITS) ×3 IMPLANT
LIGHT WAVEGUIDE WIDE FLAT (MISCELLANEOUS) IMPLANT
NEEDLE HYPO 25GX1X1/2 BEV (NEEDLE) ×3 IMPLANT
NS IRRIG 1000ML POUR BTL (IV SOLUTION) ×3 IMPLANT
PACK GENERAL/GYN (CUSTOM PROCEDURE TRAY) ×3 IMPLANT
SPONGE LAP 4X18 RFD (DISPOSABLE) ×3 IMPLANT
SUT MNCRL AB 4-0 PS2 18 (SUTURE) ×3 IMPLANT
SUT SILK 2 0 SH (SUTURE) ×3 IMPLANT
SUT VIC AB 3-0 SH 18 (SUTURE) ×3 IMPLANT
SYR CONTROL 10ML LL (SYRINGE) ×3 IMPLANT
TOWEL GREEN STERILE (TOWEL DISPOSABLE) ×3 IMPLANT
TOWEL GREEN STERILE FF (TOWEL DISPOSABLE) ×3 IMPLANT

## 2019-10-06 NOTE — Op Note (Signed)
Patient Name:           Tara Bell   Date of Surgery:        10/06/2019  Pre op Diagnosis:      Atypical ductal hyperplasia right breast, lower inner quadrant, retroareolar                                      History cancer left breast  Post op Diagnosis:    Same  Procedure:                 Right breast lumpectomy with radioactive seed localization  Surgeon:                     Edsel Petrin. Dalbert Batman, M.D., FACS  Assistant:                      OR staff  Operative Indications:          This is a pleasant 53 year old female, referred by Dr. 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 Dr. Lucianne Lei is her PCP.      Past history is significant for cancer of the left breast in 2002. Dr. Margot Chimes performed left partial mastectomy, Port-A-Cath insertion and axillary lymph node dissection. 9 out of 14 nodes were positive. Tumor was 2.5 cm. 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 She received chemotherapy and radiation therapy to the breast axilla and supraclavicular area. Starr Sinclair was her oncologist. There has been no local recurrence. 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 She had a core biopsy on the right in 2017 which was a benign fibroadenoma Mammograms on January 21, 2019 category 1. Because of high breast density Dr. Burr Medico at the [facility] sent her for MRI on August 16, 2019 and this shows a 5 mm mass on the right side, lower inner quadrant, middle depth. The left breast looks fine MRI guided Core biopsy of the right shows atypical ductal hyperplasia      Past history significant for right breast cancer 2002 with surgery, chemotherapy, radiation therapy. No evidence of recurrence next fibroadenoma right breast biopsy 2017, uterine fibroids, significant size, and anemia. Left upper extremity lymphedema. Bilateral subpectoral saline  implants by Dr. Towanda Malkin next line family history is positive for colon cancer in a sister who died. Prostate cancer in her father. Gastric cancer in a paternal uncle. There is no family history of breast or ovarian cancer.       I explained ADH to her. Certainly this is premalignant and could lead to cancer in that area of the breast. Additionally this is a high risk lesion with 15% or so risk of upgrade on excision at this time. We talked about this for a long time. I advised excision of this area with radioactive seed localization and she completely agrees She'll be scheduled for right breast lumpectomy with radioactive seed localization.Marland Kitchen   Operative Findings:       The biopsy clip and radioactive seed were in the right breast, lower inner quadrant.  This was posterior to the areolar margin in this location.  I was able to make a circumareolar incision, through the old scar that was present from implant placement, medial and inferior.  The specimen mammogram looked good and contained  the radioactive seed and the original biopsy clip.  The tissues were a little bit thickened but this may have just been scar tissue.  Procedure in Detail:          Following the induction of general LMA anesthesia the patient's right breast was prepped and draped in the sterile fashion.  Surgical timeout was performed.  Intravenous antibiotics were given.  Because of the implant I only infiltrated 0.5% Marcaine with epinephrine in the skin and superficial subdermal areas.  A curvilinear incision was made with a knife at the areolar margin medially and inferiorly.  The lumpectomy was performed with the neoprobe and electrocautery.  The specimen was removed and marked with silk sutures and a 6 color ink kit.  Specimen mammogram looked good as described above.  The specimen was sent to the lab where the seed was retrieved.  Hemostasis was excellent.  The wound was irrigated.  5 metal marker clips were placed in the walls  of the lumpectomy cavity.  There was no evidence of injury or exposure of the implant.  The breast tissues were carefully reapproximated with 3-0 Vicryl sutures and the skin closed with a running subcuticular 4-0 Monocryl and Dermabond.  Dry bandages and a breast binder were placed.  The patient tolerated the procedure well and was taken to PACU in stable condition.  EBL 10 cc.  Counts correct.  Complications none.    Addendum: I logged onto the PMP aware website and reviewed her prescription medication history.     Edsel Petrin. Dalbert Batman, M.D., FACS General and Minimally Invasive Surgery Breast and Colorectal Surgery  10/06/2019 9:53 AM

## 2019-10-06 NOTE — Discharge Instructions (Signed)
[facility] °Office Phone Number [phone] ° °BREAST BIOPSY/ PARTIAL MASTECTOMY: POST OP INSTRUCTIONS ° °Always review your discharge instruction sheet given to you by the facility where your Bell was performed. ° °IF YOU HAVE DISABILITY OR FAMILY LEAVE FORMS, YOU MUST BRING THEM TO THE OFFICE FOR PROCESSING.  DO NOT GIVE THEM TO YOUR DOCTOR. ° °1. A prescription for pain medication may be given to you upon discharge.  Take your pain medication as prescribed, if needed.  If narcotic pain medicine is not needed, then you may take acetaminophen (Tylenol) or ibuprofen (Advil) as needed. °2. Take your usually prescribed medications unless otherwise directed °3. If you need a refill on your pain medication, please contact your pharmacy.  They will contact our office to request authorization.  Prescriptions will not be filled after 5pm or on week-ends. °4. You should eat very light the first 24 hours after Bell, such as soup, crackers, pudding, etc.  Resume your normal diet the day after Bell. °5. Most patients will experience some swelling and bruising in the breast.  Ice packs and a good support bra will help.  Swelling and bruising can take several days to resolve.  °6. It is common to experience some constipation if taking pain medication after Bell.  Increasing fluid intake and taking a stool softener will usually help or prevent this problem from occurring.  A mild laxative (Milk of Magnesia or Miralax) should be taken according to package directions if there are no bowel movements after 48 hours. °7. Unless discharge instructions indicate otherwise, you may remove your bandages 24-48 hours after Bell, and you may shower at that time.  You may have steri-strips (small skin tapes) in place directly over the incision.  These strips should be left on the skin for 7-10 days.  If your surgeon used skin glue on the incision, you may shower in 24 hours.  The glue will flake off over the  next 2-3 weeks.  Any sutures or staples will be removed at the office during your follow-up visit. °8. ACTIVITIES:  You may resume regular daily activities (gradually increasing) beginning the next day.  Wearing a good support bra or sports bra minimizes pain and swelling.  You may have sexual intercourse when it is comfortable. °a. You may drive when you no longer are taking prescription pain medication, you can comfortably wear a seatbelt, and you can safely maneuver your car and apply brakes. °b. RETURN TO WORK:  ______________________________________________________________________________________ °9. You should see your doctor in the office for a follow-up appointment approximately two weeks after your Bell.  Your doctor’s nurse will typically make your follow-up appointment when she calls you with your pathology report.  Expect your pathology report 2-3 business days after your Bell.  You may call to check if you do not hear from us after three days. °10. OTHER INSTRUCTIONS: _______________________________________________________________________________________________ _____________________________________________________________________________________________________________________________________ °_____________________________________________________________________________________________________________________________________ °_____________________________________________________________________________________________________________________________________ ° °WHEN TO CALL YOUR DOCTOR: °1. Fever over 101.0 °2. Nausea and/or vomiting. °3. Extreme swelling or bruising. °4. Continued bleeding from incision. °5. Increased pain, redness, or drainage from the incision. ° °The clinic staff is available to answer your questions during regular business hours.  Please don’t hesitate to call and ask to speak to one of the nurses for clinical concerns.  If you have a medical emergency, go to the nearest  emergency room or call 911.  A surgeon from [facility] is always on call at the hospital. ° °For further questions, please visit [email or web address]  ° ° ° ° ° ° ° ° ° ° °••••••••• ° ° °  Managing Your Pain After Bell Without Opioids ° ° ° °Thank you for participating in our program to help patients manage their pain after Bell without opioids. This is part of our effort to provide you with the best care possible, without exposing you or your family to the risk that opioids pose. ° °What pain can I expect after Bell? °You can expect to have some pain after Bell. This is normal. The pain is typically worse the day after Bell, and quickly begins to get better. °Many studies have found that many patients are able to manage their pain after Bell with Over-the-Counter (OTC) medications such as Tylenol and Motrin. If you have a condition that does not allow you to take Tylenol or Motrin, notify your surgical team. ° °How will I manage my pain? °The best strategy for controlling your pain after Bell is around the clock pain control with Tylenol (acetaminophen) and Motrin (ibuprofen or Advil). Alternating these medications with each other allows you to maximize your pain control. In addition to Tylenol and Motrin, you can use heating pads or ice packs on your incisions to help reduce your pain. ° °How will I alternate your regular strength over-the-counter pain medication? °You will take a dose of pain medication every three hours. °; Start by taking 650 mg of Tylenol (2 pills of 325 mg) °; 3 hours later take 600 mg of Motrin (3 pills of 200 mg) °; 3 hours after taking the Motrin take 650 mg of Tylenol °; 3 hours after that take 600 mg of Motrin. ° ° °- 1 - ° °See example - if your first dose of Tylenol is at 12:00 PM ° ° °12:00 PM Tylenol 650 mg (2 pills of 325 mg)  °3:00 PM Motrin 600 mg (3 pills of 200 mg)  °6:00 PM Tylenol 650 mg (2 pills of 325 mg)  °9:00 PM Motrin 600 mg (3  pills of 200 mg)  °Continue alternating every 3 hours  ° °We recommend that you follow this schedule around-the-clock for at least 3 days after Bell, or until you feel that it is no longer needed. Use the table on the last page of this handout to keep track of the medications you are taking. °Important: °Do not take more than 3000mg of Tylenol or 3200mg of Motrin in a 24-hour period. °Do not take ibuprofen/Motrin if you have a history of bleeding stomach ulcers, severe kidney disease, &/or actively taking a blood thinner ° °What if I still have pain? °If you have pain that is not controlled with the over-the-counter pain medications (Tylenol and Motrin or Advil) you might have what we call “breakthrough” pain. You will receive a prescription for a small amount of an opioid pain medication such as Oxycodone, Tramadol, or Tylenol with Codeine. Use these opioid pills in the first 24 hours after Bell if you have breakthrough pain. Do not take more than 1 pill every 4-6 hours. ° °If you still have uncontrolled pain after using all opioid pills, don't hesitate to call our staff using the number provided. We will help make sure you are managing your pain in the best way possible, and if necessary, we can provide a prescription for additional pain medication. ° ° °Day 1   ° °Time  °Name of Medication Number of pills taken  °Amount of Acetaminophen  °Pain Level  ° °Comments  °AM PM       °AM PM       °AM PM       °  AM PM       °AM PM       °AM PM       °AM PM       °AM PM       °Total Daily amount of Acetaminophen °Do not take more than  3,000 mg per day    ° ° °Day 2   ° °Time  °Name of Medication Number of pills °taken  °Amount of Acetaminophen  °Pain Level  ° °Comments  °AM PM       °AM PM       °AM PM       °AM PM       °AM PM       °AM PM       °AM PM       °AM PM       °Total Daily amount of Acetaminophen °Do not take more than  3,000 mg per day    ° ° °Day 3   ° °Time  °Name of Medication Number of pills taken    °Amount of Acetaminophen  °Pain Level  ° °Comments  °AM PM       °AM PM       °AM PM       °AM PM       ° ° ° °AM PM       °AM PM       °AM PM       °AM PM       °Total Daily amount of Acetaminophen °Do not take more than  3,000 mg per day    ° ° °Day 4   ° °Time  °Name of Medication Number of pills taken  °Amount of Acetaminophen  °Pain Level  ° °Comments  °AM PM       °AM PM       °AM PM       °AM PM       °AM PM       °AM PM       °AM PM       °AM PM       °Total Daily amount of Acetaminophen °Do not take more than  3,000 mg per day    ° ° °Day 5   ° °Time  °Name of Medication Number °of pills taken  °Amount of Acetaminophen  °Pain Level  ° °Comments  °AM PM       °AM PM       °AM PM       °AM PM       °AM PM       °AM PM       °AM PM       °AM PM       °Total Daily amount of Acetaminophen °Do not take more than  3,000 mg per day    ° ° ° °Day 6   ° °Time  °Name of Medication Number of pills °taken  °Amount of Acetaminophen  °Pain Level  °Comments  °AM PM       °AM PM       °AM PM       °AM PM       °AM PM       °AM PM       °AM PM       °AM PM       °Total Daily amount of Acetaminophen °Do not take more than    3,000 mg per day    ° ° °Day 7   ° °Time  °Name of Medication Number of pills taken  °Amount of Acetaminophen  °Pain Level  ° °Comments  °AM PM       °AM PM       °AM PM       °AM PM       °AM PM       °AM PM       °AM PM       °AM PM       °Total Daily amount of Acetaminophen °Do not take more than  3,000 mg per day    ° ° ° ° °For additional information about how and where to safely dispose of unused opioid °medications - https://www.morepowerfulnc.org ° °Disclaimer: This document contains information and/or instructional materials adapted from [facility] for the typical patient with your condition. It does not replace medical advice from your health care provider because your experience may differ from that of the °typical patient. Talk to your health care provider if you have any questions about  this °document, your condition or your treatment plan. °Adapted from [facility] ° °

## 2019-10-06 NOTE — Transfer of Care (Signed)
Immediate Anesthesia Transfer of Care Note  Patient: [facility]  Procedure(s) Performed: RIGHT BREAST LUMPECTOMY WITH RADIOACTIVE SEED LOCALIZATION (Right Breast)  Patient Location: PACU  Anesthesia Type:General  Level of Consciousness: awake, alert  and oriented  Airway & Oxygen Therapy: Patient Spontanous Breathing and Patient connected to face mask oxygen  Post-op Assessment: Report given to RN and Post -op Vital signs reviewed and stable  Post vital signs: Reviewed and stable  Last Vitals:  Vitals Value Taken Time  BP 127/86 10/06/19 1000  Temp    Pulse 70 10/06/19 1003  Resp 13 10/06/19 1003  SpO2 100 % 10/06/19 1003  Vitals shown include unvalidated device data.  Last Pain:  Vitals:   10/06/19 0813  TempSrc:   PainSc: 0-No pain      Patients Stated Pain Goal: 3 ([id number])  Complications: No apparent anesthesia complications

## 2019-10-06 NOTE — Interval H&P Note (Signed)
History and Physical Interval Note:  10/06/2019 8:20 AM  [facility]  has presented today for surgery, with the diagnosis of RIGHT BREAST ATYPICAL DUCTAL HYPERPLASIA.  The various methods of treatment have been discussed with the patient and family. After consideration of risks, benefits and other options for treatment, the patient has consented to  Procedure(s): RIGHT BREAST LUMPECTOMY WITH RADIOACTIVE SEED LOCALIZATION (Right) as a surgical intervention.  The patient's history has been reviewed, patient examined, no change in status, stable for surgery.  I have reviewed the patient's chart and labs.  Questions were answered to the patient's satisfaction.     Adin Hector

## 2019-10-06 NOTE — Anesthesia Preprocedure Evaluation (Signed)
Anesthesia Evaluation  Patient identified by MRN, date of birth, ID band Patient awake    Reviewed: Allergy & Precautions, NPO status , Patient's Chart, lab work & pertinent test results  Airway Mallampati: II  TM Distance: >3 FB Neck ROM: Full    Dental  (+) Teeth Intact, Dental Advisory Given   Pulmonary    breath sounds clear to auscultation       Cardiovascular hypertension,  Rhythm:Regular Rate:Normal     Neuro/Psych    GI/Hepatic   Endo/Other    Renal/GU      Musculoskeletal   Abdominal   Peds  Hematology   Anesthesia Other Findings   Reproductive/Obstetrics                             Anesthesia Physical Anesthesia Plan  ASA: II  Anesthesia Plan: General   Post-op Pain Management:    Induction: Intravenous  PONV Risk Score and Plan: Ondansetron and Dexamethasone  Airway Management Planned: LMA  Additional Equipment:   Intra-op Plan:   Post-operative Plan:   Informed Consent: I have reviewed the patients History and Physical, chart, labs and discussed the procedure including the risks, benefits and alternatives for the proposed anesthesia with the patient or authorized representative who has indicated his/her understanding and acceptance.     Dental advisory given  Plan Discussed with: CRNA and Anesthesiologist  Anesthesia Plan Comments:         Anesthesia Quick Evaluation

## 2019-10-06 NOTE — Anesthesia Procedure Notes (Signed)
Procedure Name: LMA Insertion Date/Time: 10/06/2019 9:10 AM Performed by: Alain Marion, CRNA Pre-anesthesia Checklist: Patient identified, Emergency Drugs available, Suction available and Patient being monitored Patient Re-evaluated:Patient Re-evaluated prior to induction Oxygen Delivery Method: Circle System Utilized Preoxygenation: Pre-oxygenation with 100% oxygen Induction Type: IV induction Ventilation: Mask ventilation without difficulty LMA: LMA inserted LMA Size: 4.0 Number of attempts: 1 Airway Equipment and Method: Bite block Placement Confirmation: positive ETCO2 Tube secured with: Tape Dental Injury: Teeth and Oropharynx as per pre-operative assessment

## 2019-10-06 NOTE — Anesthesia Postprocedure Evaluation (Signed)
Anesthesia Post Note  Patient: [facility]  Procedure(s) Performed: RIGHT BREAST LUMPECTOMY WITH RADIOACTIVE SEED LOCALIZATION (Right Breast)     Patient location during evaluation: PACU Anesthesia Type: General Level of consciousness: awake and alert Pain management: pain level controlled Vital Signs Assessment: post-procedure vital signs reviewed and stable Respiratory status: spontaneous breathing, nonlabored ventilation, respiratory function stable and patient connected to nasal cannula oxygen Cardiovascular status: blood pressure returned to baseline and stable Postop Assessment: no apparent nausea or vomiting Anesthetic complications: no    Last Vitals:  Vitals:   10/06/19 1015 10/06/19 1023  BP: 115/71 126/70  Pulse: 64 61  Resp: 16 15  Temp:  36.4 C  SpO2: 100% 100%    Last Pain:  Vitals:   10/06/19 1015  TempSrc:   PainSc: 0-No pain                 Mikaela Hilgeman COKER

## 2019-10-07 ENCOUNTER — Encounter (HOSPITAL_COMMUNITY): Payer: Self-pay | Admitting: General Surgery

## 2019-10-08 LAB — SURGICAL PATHOLOGY

## 2019-10-08 NOTE — Progress Notes (Signed)
Inform patient of Pathology report,. Breast pathology is completely benign.' They did not find any further atypical hyperplasia. No further surgery needed. Will discuss in detail at next OV. Let me know you contacted her.  [facility]

## 2019-12-22 ENCOUNTER — Encounter: Payer: Managed Care, Other (non HMO) | Admitting: Women's Health

## 2019-12-28 ENCOUNTER — Other Ambulatory Visit: Payer: Self-pay

## 2019-12-29 ENCOUNTER — Encounter: Payer: Self-pay | Admitting: Women's Health

## 2019-12-29 ENCOUNTER — Ambulatory Visit (INDEPENDENT_AMBULATORY_CARE_PROVIDER_SITE_OTHER): Payer: Managed Care, Other (non HMO) | Admitting: Women's Health

## 2019-12-29 VITALS — BP 120/78 | Ht 61.0 in | Wt 144.0 lb

## 2019-12-29 DIAGNOSIS — Z13220 Encounter for screening for lipoid disorders: Secondary | ICD-10-CM | POA: Diagnosis not present

## 2019-12-29 DIAGNOSIS — E559 Vitamin D deficiency, unspecified: Secondary | ICD-10-CM | POA: Diagnosis not present

## 2019-12-29 DIAGNOSIS — R5383 Other fatigue: Secondary | ICD-10-CM | POA: Diagnosis not present

## 2019-12-29 DIAGNOSIS — Z01419 Encounter for gynecological examination (general) (routine) without abnormal findings: Secondary | ICD-10-CM

## 2019-12-29 MED ORDER — VALACYCLOVIR HCL 500 MG PO TABS: 500.0000 mg | ORAL_TABLET | Freq: Two times a day (BID) | ORAL | 6 refills | Status: DC | PRN

## 2019-12-29 NOTE — Progress Notes (Signed)
[facility] 1966/10/22 [id number]    History:    Presents for annual exam.  Amenorrheic 18 months on no HRT.  2002 at age 54 left breast cancer triple negative lumpectomy, radiation, chemotherapy.  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  10/2019 right breast lumpectomy fibrocystic changes benign biopsy.  Normal Pap history.  Numerous fibroids largest being 13 cm.  Had been contemplating TAH with BSO cost influencing decision.  1064 53 year old sister deceased from colon cancer, helped care for her.  Primary care manages hypertension and labs.  Same partner years.  2018 - colonoscopy 5-year follow-up.  Past medical history, past surgical history, family history and social history were all reviewed and documented in the EPIC chart.  Works at [employer], 2 daughters and 1 grandson all doing well.  ROS:  A ROS was performed and pertinent positives and negatives are included.  Exam:  Vitals:   12/29/19 1213  BP: 120/78  Weight: 144 lb (65.3 kg)  Height: 5' 1" (1.549 m)   Body mass index is 27.21 kg/m.   General appearance:  Normal Thyroid:  Symmetrical, normal in size, without palpable masses or nodularity. Respiratory  Auscultation:  Clear without wheezing or rhonchi Cardiovascular  Auscultation:  Regular rate, without rubs, murmurs or gallops  Edema/varicosities:  Not grossly evident Abdominal  Soft,nontender, without masses, guarding or rebound.  Liver/spleen:  No organomegaly noted  Hernia:  None appreciated  Skin  Inspection:  Grossly normal   Breasts: Examined lying and sitting.  Bilateral saline implants     Right: Well-healed incisions without masses, retractions, discharge or axillary adenopathy.     Left: Without masses, retractions, discharge or axillary adenopathy. Gentitourinary   Inguinal/mons:  Normal without inguinal adenopathy  External genitalia:  Normal  BUS/Urethra/Skene's glands:  Normal  Vagina:  Normal  Cervix:   Normal  Uterus: 22 weeks size fibroid uterus nontender.  Midline and mobile  Adnexa/parametria:     Rt: Without masses or tenderness.   Lt: Without masses or tenderness.  Anus and perineum: Normal  Digital rectal exam: Normal sphincter tone without palpated masses or tenderness  Assessment/Plan:  54 y.o. SBF G2 P2 for annual exam with no complaints.  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  Instructed to call if would like to proceed to discuss with Dr. Delilah Shan.  SBEs, exercise, calcium rich foods, vitamin D 2000 IUs daily.  Aware of importance of exercise, leisure activities.  Refill of Valtrex 500 p.o twice daily for 3 to 5 days as needed given.  Lipid panel per request, vitamin D, TSH, T4.  Pap normal 2020, new screening guidelines reviewed.   Huel Cote [facility], 1:32 PM 12/29/2019

## 2019-12-29 NOTE — Patient Instructions (Addendum)
Good to see you Vit D 2000 iu daily Health Maintenance for Postmenopausal Women Menopause is a normal process in which your ability to get pregnant comes to an end. This process happens slowly over many months or years, usually between the ages of 41 and 98. Menopause is complete when you have missed your menstrual periods for 12 months. It is important to talk with your health care provider about some of the most common conditions that affect women after menopause (postmenopausal women). These include heart disease, cancer, and bone loss (osteoporosis). Adopting a healthy lifestyle and getting preventive care can help to promote your health and wellness. The actions you take can also lower your chances of developing some of these common conditions. What should I know about menopause? During menopause, you may get a number of symptoms, such as:  Hot flashes. These can be moderate or severe.  Night sweats.  Decrease in sex drive.  Mood swings.  Headaches.  Tiredness.  Irritability.  Memory problems.  Insomnia. Choosing to treat or not to treat these symptoms is a decision that you make with your health care provider. Do I need hormone replacement therapy?  Hormone replacement therapy is effective in treating symptoms that are caused by menopause, such as hot flashes and night sweats.  Hormone replacement carries certain risks, especially as you become older. If you are thinking about using estrogen or estrogen with progestin, discuss the benefits and risks with your health care provider. What is my risk for heart disease and stroke? The risk of heart disease, heart attack, and stroke increases as you age. One of the causes may be a change in the body's hormones during menopause. This can affect how your body uses dietary fats, triglycerides, and cholesterol. Heart attack and stroke are medical emergencies. There are many things that you can do to help prevent heart disease and  stroke. Watch your blood pressure  High blood pressure causes heart disease and increases the risk of stroke. This is more likely to develop in people who have high blood pressure readings, are of African descent, or are overweight.  Have your blood pressure checked: ? Every 3-5 years if you are 46-37 years of age. ? Every year if you are 81 years old or older. Eat a healthy diet   Eat a diet that includes plenty of vegetables, fruits, low-fat dairy products, and lean protein.  Do not eat a lot of foods that are high in solid fats, added sugars, or sodium. Get regular exercise Get regular exercise. This is one of the most important things you can do for your health. Most adults should:  Try to exercise for at least 150 minutes each week. The exercise should increase your heart rate and make you sweat (moderate-intensity exercise).  Try to do strengthening exercises at least twice each week. Do these in addition to the moderate-intensity exercise.  Spend less time sitting. Even light physical activity can be beneficial. Other tips  Work with your health care provider to achieve or maintain a healthy weight.  Do not use any products that contain nicotine or tobacco, such as cigarettes, e-cigarettes, and chewing tobacco. If you need help quitting, ask your health care provider.  Know your numbers. Ask your health care provider to check your cholesterol and your blood sugar (glucose). Continue to have your blood tested as directed by your health care provider. Do I need screening for cancer? Depending on your health history and family history, you may need to have  cancer screening at different stages of your life. This may include screening for:  Breast cancer.  Cervical cancer.  Lung cancer.  Colorectal cancer. What is my risk for osteoporosis? After menopause, you may be at increased risk for osteoporosis. Osteoporosis is a condition in which bone destruction happens more  quickly than new bone creation. To help prevent osteoporosis or the bone fractures that can happen because of osteoporosis, you may take the following actions:  If you are 29-10 years old, get at least 1,000 mg of calcium and at least 600 mg of vitamin D per day.  If you are older than age 47 but younger than age 61, get at least 1,200 mg of calcium and at least 600 mg of vitamin D per day.  If you are older than age 43, get at least 1,200 mg of calcium and at least 800 mg of vitamin D per day. Smoking and drinking excessive alcohol increase the risk of osteoporosis. Eat foods that are rich in calcium and vitamin D, and do weight-bearing exercises several times each week as directed by your health care provider. How does menopause affect my mental health? Depression may occur at any age, but it is more common as you become older. Common symptoms of depression include:  Low or sad mood.  Changes in sleep patterns.  Changes in appetite or eating patterns.  Feeling an overall lack of motivation or enjoyment of activities that you previously enjoyed.  Frequent crying spells. Talk with your health care provider if you think that you are experiencing depression. General instructions See your health care provider for regular wellness exams and vaccines. This may include:  Scheduling regular health, dental, and eye exams.  Getting and maintaining your vaccines. These include: ? Influenza vaccine. Get this vaccine each year before the flu season begins. ? Pneumonia vaccine. ? Shingles vaccine. ? Tetanus, diphtheria, and pertussis (Tdap) booster vaccine. Your health care provider may also recommend other immunizations. Tell your health care provider if you have ever been abused or do not feel safe at home. Summary  Menopause is a normal process in which your ability to get pregnant comes to an end.  This condition causes hot flashes, night sweats, decreased interest in sex, mood swings,  headaches, or lack of sleep.  Treatment for this condition may include hormone replacement therapy.  Take actions to keep yourself healthy, including exercising regularly, eating a healthy diet, watching your weight, and checking your blood pressure and blood sugar levels.  Get screened for cancer and depression. Make sure that you are up to date with all your vaccines. This information is not intended to replace advice given to you by your health care provider. Make sure you discuss any questions you have with your health care provider. Document Revised: 10/15/2018 Document Reviewed: 10/15/2018 Elsevier Patient Education  [place].  Hysterectomy Information  A hysterectomy is a surgery to remove your uterus. After surgery, you will no longer have periods. Also, you will no longer be able to get pregnant. Reasons for this surgery You may have this surgery if:  You have bleeding in your vagina: ? That is not normal. ? That does not stop, or that keeps coming back.  You have long-term (chronic) pain in your lower belly (pelvic area).  The lining of your uterus grows outside of the uterus (endometriosis).  The lining of your uterus grows in the muscle of the uterus (adenomyosis).  Your uterus falls down into your vagina (prolapse).  You have a growth in your uterus that causes problems (uterine fibroids).  You have cells that could turn into cancer (precancerous cells).  You have cancer of the uterus or cervix. Types of hysterectomies There are 3 types of hysterectomies. Depending on the type, the surgery will:  Remove the top part of the uterus (supracervical).  Remove the uterus and the cervix (total).  Remove the uterus, cervix, and tissue that holds the uterus in place (radical). Ways a hysterectomy can be done This surgery may be done in one of these ways:  A cut (incision) is made in the belly (abdomen). The uterus is taken out through the cut.  A cut is  made in the vagina. The uterus is taken out through the cut.  Three or four cuts are made in the belly. A device with a camera is put through one of the cuts. The uterus is cut into pieces and taken out through the cuts or the vagina.  Three or four cuts are made in the belly. A device with a camera is put through one of the cuts. The uterus is taken out through the vagina.  Three or four cuts are made in the belly. A computer helps control the surgical tools. The uterus is cut into small pieces. The pieces are taken out through the cuts or through the vagina. Talk with your doctor about which way is best for you. Risks of hysterectomy Generally, this surgery is safe. However, problems can happen, including:  Bleeding.  Needing donated blood (transfusion).  Blood clots.  Infection.  Damage to other structures or organs.  Allergic reactions.  Needing to switch to a different type of surgery. What to expect after surgery  You will be given pain medicine.  You will need to stay in the hospital for 1-2 days.  Follow your doctor's instructions about: ? Exercising. ? Driving. ? What activities are safe for you.  You will need to have someone with you at home for 3-5 days.  You will need to see your doctor after 2-4 weeks.  You may get hot flashes, have night sweats, and have trouble sleeping.  You may need to have Pap tests if your surgery was related to cancer. Talk with your doctor about how often you need Pap tests. Questions to ask your doctor  Do I need this surgery? Do I have other treatment options?  What are my options for this surgery?  What needs to be removed?  What are the risks?  What are the benefits?  How long will I need to stay in the hospital?  How long will I need to recover?  What symptoms can I expect after the procedure? Summary  A hysterectomy is a surgery to remove your uterus. After surgery, you will no longer have periods. Also, you  will no longer be able to get pregnant.  Talk with your doctor about which type of hysterectomy is best for you. This information is not intended to replace advice given to you by your health care provider. Make sure you discuss any questions you have with your health care provider. Document Revised: 12/25/2018 Document Reviewed: 01/22/2017 Elsevier Patient Education  [place].

## 2019-12-30 LAB — T4: T4, Total: 10.7 ug/dL (ref 4.5–12.0)

## 2019-12-30 LAB — LIPID PANEL
Chol/HDL Ratio: 2.3 ratio (ref 0.0–4.4)
Cholesterol, Total: 194 mg/dL (ref 100–199)
HDL: 84 mg/dL (ref >39)
LDL Chol Calc (NIH): 98 mg/dL (ref 0–99)
Triglycerides: 68 mg/dL (ref 0–149)
VLDL Cholesterol Cal: 12 mg/dL (ref 5–40)

## 2019-12-30 LAB — VITAMIN D 25 HYDROXY (VIT D DEFICIENCY, FRACTURES): Vit D, 25-Hydroxy: 52.9 ng/mL (ref 30.0–100.0)

## 2019-12-30 LAB — TSH: TSH: 0.956 u[IU]/mL (ref 0.450–4.500)

## 2020-01-22 ENCOUNTER — Ambulatory Visit: Payer: Managed Care, Other (non HMO)

## 2020-01-26 ENCOUNTER — Other Ambulatory Visit: Payer: Self-pay | Admitting: Women's Health

## 2020-01-30 ENCOUNTER — Ambulatory Visit
Admission: EM | Admit: 2020-01-30 | Discharge: 2020-01-30 | Disposition: A | Discharge status: Home / Self Care | Payer: Managed Care, Other (non HMO) | Attending: Physician Assistant | Admitting: Physician Assistant

## 2020-01-30 ENCOUNTER — Encounter: Payer: Self-pay | Admitting: Emergency Medicine

## 2020-01-30 ENCOUNTER — Other Ambulatory Visit: Payer: Self-pay

## 2020-01-30 DIAGNOSIS — J069 Acute upper respiratory infection, unspecified: Secondary | ICD-10-CM

## 2020-01-30 DIAGNOSIS — R05 Cough: Secondary | ICD-10-CM

## 2020-01-30 DIAGNOSIS — R059 Cough, unspecified: Secondary | ICD-10-CM

## 2020-01-30 MED ORDER — AZITHROMYCIN 250 MG PO TABS: ORAL_TABLET | ORAL | 0 refills | Status: DC

## 2020-01-30 NOTE — Discharge Instructions (Addendum)
Currently you appear to have a VIRAL respiratory infection, that Antibiotics will not treat. If you continue to worsen in 3-4 days then ok to take the antibiotic. Also Elderberry gummies 2 in am and 2 in pm will be helpful. Delsym as directed for cough. Drink plenty of water.

## 2020-01-30 NOTE — ED Triage Notes (Addendum)
Pt presents to [facility] for assessment of 5 days of sore throat, cough, runny nose, sneezing, fever (101 at home, comes down to 99s with Tylenol), headaches.  Negative COVID on Thursday.

## 2020-01-30 NOTE — ED Notes (Signed)
Patient able to ambulate independently  

## 2020-01-30 NOTE — ED Provider Notes (Signed)
[name]    CSN: [id number] Arrival date & time: 01/30/20  1027      History   Chief Complaint Chief Complaint  Patient presents with  . URI    HPI Tara Bell is a 54 y.o. female.      Who presents with a 6 day history of initially sore throat, then dry cough, headaches and myalgia. Low grade fevers without chills. Covid negative. She wanted to get checked out due to ongoing symptoms.      Past Medical History:  Diagnosis Date  . Anemia   . 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 BRCA1 negative 08/2011  . 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 Breast cancer ([place]) SEPTEMBER 2002   LEFT BREAST-SURGERY,CHEMO,RADIATION-DR.[facility]  . Family history of colon cancer   . Family history of prostate cancer   . Hypertension   . 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 Lymphedema of arm    DUE TO LEFT   AXILLARY NODE DISSECTION   AND RADIATION  . Migraines   . S/P cardiac catheterization, with normal coronary arteries and normal EF, 07/01/13 07/13/2013    Patient Active Problem List   Diagnosis Date Noted  . 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 Family history of colon cancer   . Family history of prostate cancer   . 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 Lymphedema of arm 07/28/2016  . Personal history of malignant neoplasm of breast 07/28/2016  . Genetic testing 07/19/2015  . Dense breast tissue 07/03/2015  . Elevated CA-125 07/03/2015  . Iron deficiency anemia due to chronic blood loss 07/03/2015  . Screening mammogram for high-risk patient 07/03/2015  . History of antineoplastic chemotherapy 10/31/2014  . Palpitations 04/19/2014  . Easy bruising 04/19/2014  . Fibroid uterus 01/26/2014  . Elevated troponin I level, mildly secondary to HTN 07/13/2013  . S/P cardiac catheterization, with normal coronary arteries and normal EF, 07/01/13 07/13/2013  .  Hyperlipidemia LDL goal < 100, normal coronary arteries. 07/13/2013  . Chest pain 06/30/2013  . Fibroids, intramural 10/24/2012  . BRCA1 negative   . BRCA2 indeterminant marker   . Hypertension   . History of left breast cancer 07/06/2001    Past Surgical History:  Procedure Laterality Date  . AUGMENTATION MAMMAPLASTY  2008  . BILATERAL TUBAL    . BREAST BIOPSY    . BREAST LUMPECTOMY Left 2002   AE 35  . BREAST LUMPECTOMY WITH RADIOACTIVE SEED LOCALIZATION Right 10/06/2019   Procedure: RIGHT BREAST LUMPECTOMY WITH RADIOACTIVE SEED LOCALIZATION;  Surgeon: Fanny Skates, MD;  Location: [facility];  Service: General;  Laterality: Right;  . CARDIAC CATHETERIZATION  07/01/13   Angiographically normal coronary arteries with normal left atrial function  . HYSTEROSCOPY  12/10/07   RESECTOSCOPIC POLYPECTOMY/MYOMECTOMY  . LEFT HEART CATHETERIZATION WITH CORONARY ANGIOGRAM N/A 07/01/2013   Procedure: LEFT HEART CATHETERIZATION WITH CORONARY ANGIOGRAM;  Surgeon: Leonie Man, MD;  Location: [facility] CATH LAB;  Service: Cardiovascular;  Laterality: N/A;  . MASTECTOMY, PARTIAL  07/23/2001   LEFT BREAST WITH AXILLARY NODE EVALUATION  . TUBAL LIGATION     BILATERAL    OB History    Gravida  2   Para  2   Term      Preterm      AB      Living  2     SAB  TAB      Ectopic      Multiple      Live Births               Home Medications    Prior to Admission medications   Medication Sig Start Date End Date Taking? Authorizing Provider  ibuprofen (ADVIL) 600 MG tablet TAKE 1 TABLET BY MOUTH EVERY 8 HOURS AS NEEDED 01/26/20   Huel Cote, NP  baclofen (LIORESAL) 10 MG tablet Take 10 mg by mouth 2 (two) times daily as needed for migraine. 08/18/19   [provider]  diclofenac sodium (VOLTAREN) 1 % GEL Apply 1 application topically 4 (four) times daily as needed for pain. 07/29/19   [provider]  HYDROcodone-acetaminophen (NORCO) 5-325 MG tablet Take 1-2  tablets by mouth every 6 (six) hours as needed for moderate pain or severe pain. 10/06/19   Fanny Skates, MD  metoprolol tartrate (LOPRESSOR) 25 MG tablet Take 0.5 tablets (12.5 mg total) by mouth 2 (two) times daily. Patient taking differently: Take 12.5 mg by mouth daily.  04/19/14   Leonie Man, MD  TRIBENZOR 20-5-12.5 MG TABS Take 1 tablet by mouth daily.  06/27/15   [provider]  valACYclovir (VALTREX) 500 MG tablet Take 1 tablet (500 mg total) by mouth 2 (two) times daily as needed (fever blisters/cold sores.). For 3-5 days 12/29/19   Huel Cote, NP  zonisamide (ZONEGRAN) 25 MG capsule Take 25 mg by mouth 4 (four) times daily. 09/16/19   [provider]    Family History Family History  Problem Relation Age of Onset  . Hypertension Mother   . Heart disease Mother   . Hypertension Father   . Prostate cancer Father 86  . Cancer Maternal Aunt        unknown type  . Stomach cancer Paternal Uncle        d. 82  . Leukemia Paternal Uncle   . Hypertension Sister   . Colon cancer Sister 3  . Hypertension Sister     Social History Social History   Tobacco Use  . Smoking status: Never Smoker  . Smokeless tobacco: Never Used  Substance Use Topics  . Alcohol use: Not Currently  . Drug use: No     Allergies   Nitrofurantoin monohyd macro   Review of Systems Review of Systems  Constitutional: Positive for fatigue. Negative for chills and fever.  HENT: Positive for sore throat. Negative for sinus pressure and sinus pain.   Eyes: Negative for discharge.  Respiratory: Positive for cough. Negative for shortness of breath and wheezing.   Cardiovascular: Negative.   Endocrine: Negative.   Skin: Negative for rash.  Psychiatric/Behavioral: Negative.      Physical Exam Triage Vital Signs ED Triage Vitals  Enc Vitals Group     BP 01/30/20 1036 111/76     Pulse Rate 01/30/20 1036 95     Resp 01/30/20 1036 18     Temp 01/30/20 1036 98 F (36.7 C)      Temp Source 01/30/20 1036 Temporal     SpO2 01/30/20 1036 96 %     Weight --      Height --      Head Circumference --      Peak Flow --      Pain Score 01/30/20 1038 5     Pain Loc --      Pain Edu? --      Excl. in [place]? --  No data found.  Updated Vital Signs BP 111/76 (BP Location: Left Arm)   Pulse 95   Temp 98 F (36.7 C) (Temporal)   Resp 18   LMP 08/21/2017   SpO2 96%   Visual Acuity Right Eye Distance:   Left Eye Distance:   Bilateral Distance:    Right Eye Near:   Left Eye Near:    Bilateral Near:     Physical Exam Vitals and nursing note reviewed.  Constitutional:      General: She is not in acute distress.    Appearance: Normal appearance. She is not ill-appearing.  HENT:     Head: Normocephalic and atraumatic.     Right Ear: Tympanic membrane normal.     Left Ear: Tympanic membrane normal.     Mouth/Throat:     Mouth: Mucous membranes are moist.     Pharynx: No oropharyngeal exudate or posterior oropharyngeal erythema.  Eyes:     General: No scleral icterus. Neck:     Comments: Mild cervical adenopathy with tenderness to palpation Cardiovascular:     Rate and Rhythm: Normal rate and regular rhythm.  Pulmonary:     Effort: Pulmonary effort is normal.     Breath sounds: Normal breath sounds. No wheezing or rhonchi.  Musculoskeletal:     Cervical back: Normal range of motion.  Lymphadenopathy:     Cervical: Cervical adenopathy present.  Skin:    General: Skin is warm and dry.     Findings: No rash.  Neurological:     General: No focal deficit present.     Mental Status: She is alert.  Psychiatric:        Mood and Affect: Mood normal.      UC Treatments / Results  Labs (all labs ordered are listed, but only abnormal results are displayed) Labs Reviewed - No data to display  EKG   Radiology No results found.  Procedures Procedures (including critical care time)  Medications Ordered in UC Medications - No data to  display  Initial Impression / Assessment and Plan / UC Course  I have reviewed the triage vital signs and the nursing notes.  Pertinent labs & imaging results that were available during my care of the patient were reviewed by me and considered in my medical decision making (see chart for details).    Probable ongoing viral infection without definitive symptoms or exam findings of a bacterial injection. Z-pack given if her symptoms persist out past another 3-4 days. FU with PCP if worsens.  Final Clinical Impressions(s) / UC Diagnoses   Final diagnoses:  None   Discharge Instructions   None    ED Prescriptions    None     PDMP not reviewed this encounter.   Bjorn Pippin, PA-C 01/30/20 1056

## 2020-02-02 ENCOUNTER — Ambulatory Visit: Payer: Managed Care, Other (non HMO)

## 2020-02-05 ENCOUNTER — Other Ambulatory Visit: Payer: Self-pay

## 2020-02-05 ENCOUNTER — Ambulatory Visit (INDEPENDENT_AMBULATORY_CARE_PROVIDER_SITE_OTHER): Payer: Managed Care, Other (non HMO)

## 2020-02-05 ENCOUNTER — Ambulatory Visit
Admission: EM | Admit: 2020-02-05 | Discharge: 2020-02-05 | Disposition: A | Discharge status: Home / Self Care | Payer: Managed Care, Other (non HMO) | Attending: Physician Assistant | Admitting: Physician Assistant

## 2020-02-05 DIAGNOSIS — J209 Acute bronchitis, unspecified: Secondary | ICD-10-CM | POA: Diagnosis not present

## 2020-02-05 DIAGNOSIS — R05 Cough: Secondary | ICD-10-CM | POA: Diagnosis not present

## 2020-02-05 DIAGNOSIS — R0789 Other chest pain: Secondary | ICD-10-CM | POA: Diagnosis not present

## 2020-02-05 MED ORDER — TIZANIDINE HCL 2 MG PO TABS: 2.0000 mg | ORAL_TABLET | Freq: Four times a day (QID) | ORAL | 0 refills | Status: DC | PRN

## 2020-02-05 MED ORDER — PREDNISONE 50 MG PO TABS: 50.0000 mg | ORAL_TABLET | Freq: Every day | ORAL | 0 refills | Status: DC

## 2020-02-05 NOTE — Discharge Instructions (Signed)
Chest xray negative for pneumonia, fluid, masses. Continue azithromycin. Start prednisone as directed. Tizanidine as needed for back pain/spasms, this can make you drowsy, do not take if planning to drive. Keep hydrated, your urine should be clear to pale yellow in color. Tylenol/motrin for fever and pain. Monitor for any worsening of symptoms, chest pain, shortness of breath, wheezing, swelling of the throat, go to the emergency department for further evaluation needed.

## 2020-02-05 NOTE — ED Triage Notes (Signed)
Pt c/o worsening URI sx. C/o productive cough and pain to posterior and right rib cage. States pain increases with cough.Ongoing nasal congestion with green sputum cleared. States mild transient nausea and abdom pain this morning. Denies SOB. Started Rx of zithromax yesterday.  Pt able to speak full sentences w/o difficulty. Denies fever, chills,  or loss of taste/smell

## 2020-02-05 NOTE — ED Provider Notes (Signed)
[name]    CSN: [id number] Arrival date & time: 02/05/20  1020      History   Chief Complaint Chief Complaint  Patient presents with  . Cough    HPI Tara Bell is a 54 y.o. female.   54 year old female with history of left breast cancer s/p lumpectomy, radiation, chemo in remission, recent right lumpectomy with benign biopsy, HTN comes in for worsening URI symptoms after being seen 01/30/2020. She was prescribed azithromycin and was told to start if symptoms not improving, she started first dose yesterday. She already had productive cough when last seen, now with worsening right posterior rib pain. Continues nasal congestion. Mild intermittent nausea with epigastric pain this morning. Described epigastric pain as tightness, worse with laying down. Denies chest pain, shortness of breath, orthopnea, leg swelling. Denies fever. Never smoker. No inhaler use. Also using otc cough medicine without relief.      Past Medical History:  Diagnosis Date  . Anemia   . 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 BRCA1 negative 08/2011  . 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 Breast cancer ([place]) SEPTEMBER 2002   LEFT BREAST-SURGERY,CHEMO,RADIATION-DR.LIVESAY  . Family history of colon cancer   . Family history of prostate cancer   . Hypertension   . 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 Lymphedema of arm    DUE TO LEFT   AXILLARY NODE DISSECTION   AND RADIATION  . Migraines   . S/P cardiac catheterization, with normal coronary arteries and normal EF, 07/01/13 07/13/2013    Patient Active Problem List   Diagnosis Date Noted  . 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 Family history of colon cancer   . Family history of prostate cancer   . 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 Lymphedema of arm 07/28/2016  . Personal history of malignant neoplasm of breast 07/28/2016  . Genetic  testing 07/19/2015  . Dense breast tissue 07/03/2015  . Elevated CA-125 07/03/2015  . Iron deficiency anemia due to chronic blood loss 07/03/2015  . Screening mammogram for high-risk patient 07/03/2015  . History of antineoplastic chemotherapy 10/31/2014  . Palpitations 04/19/2014  . Easy bruising 04/19/2014  . Fibroid uterus 01/26/2014  . Elevated troponin I level, mildly secondary to HTN 07/13/2013  . S/P cardiac catheterization, with normal coronary arteries and normal EF, 07/01/13 07/13/2013  . Hyperlipidemia LDL goal < 100, normal coronary arteries. 07/13/2013  . Chest pain 06/30/2013  . Fibroids, intramural 10/24/2012  . BRCA1 negative   . BRCA2 indeterminant marker   . Hypertension   . History of left breast cancer 07/06/2001    Past Surgical History:  Procedure Laterality Date  . AUGMENTATION MAMMAPLASTY  2008  . BILATERAL TUBAL    . BREAST BIOPSY    . BREAST LUMPECTOMY Left 2002   AE 35  . BREAST LUMPECTOMY WITH RADIOACTIVE SEED LOCALIZATION Right 10/06/2019   Procedure: RIGHT BREAST LUMPECTOMY WITH RADIOACTIVE SEED LOCALIZATION;  Surgeon: Fanny Skates, MD;  Location: [facility];  Service: General;  Laterality: Right;  . CARDIAC CATHETERIZATION  07/01/13   Angiographically normal coronary arteries with normal left atrial function  . HYSTEROSCOPY  12/10/07   RESECTOSCOPIC POLYPECTOMY/MYOMECTOMY  . LEFT HEART CATHETERIZATION WITH CORONARY ANGIOGRAM N/A 07/01/2013   Procedure: LEFT HEART CATHETERIZATION WITH CORONARY ANGIOGRAM;  Surgeon: Leonie Man, MD;  Location: [facility] C[facility] LAB;  Service: Cardiovascular;  Laterality: N/A;  .  MASTECTOMY, PARTIAL  07/23/2001   LEFT BREAST WITH AXILLARY NODE EVALUATION  . TUBAL LIGATION     BILATERAL    OB History    Gravida  2   Para  2   Term      Preterm      AB      Living  2     SAB      TAB      Ectopic      Multiple      Live Births               Home Medications    Prior to Admission medications    Medication Sig Start Date End Date Taking? Authorizing Provider  azithromycin (ZITHROMAX Z-PAK) 250 MG tablet 5 day pack as directed 01/30/20  Yes Young, Vanessa [place], PA-C  ibuprofen (ADVIL) 600 MG tablet TAKE 1 TABLET BY MOUTH EVERY 8 HOURS AS NEEDED 01/26/20  Yes Huel Cote, NP  metoprolol tartrate (LOPRESSOR) 25 MG tablet Take 0.5 tablets (12.5 mg total) by mouth 2 (two) times daily. Patient taking differently: Take 12.5 mg by mouth daily.  04/19/14  Yes Leonie Man, MD  TRIBENZOR 20-5-12.5 MG TABS Take 1 tablet by mouth daily.  06/27/15  Yes [provider]  diclofenac sodium (VOLTAREN) 1 % GEL Apply 1 application topically 4 (four) times daily as needed for pain. 07/29/19   [provider]  predniSONE (DELTASONE) 50 MG tablet Take 1 tablet (50 mg total) by mouth daily with breakfast. 02/05/20   Tasia Catchings, Torryn Fiske V, PA-C  tiZANidine (ZANAFLEX) 2 MG tablet Take 1 tablet (2 mg total) by mouth every 6 (six) hours as needed for muscle spasms. 02/05/20   Tasia Catchings, Aina Rossbach V, PA-C  valACYclovir (VALTREX) 500 MG tablet Take 1 tablet (500 mg total) by mouth 2 (two) times daily as needed (fever blisters/cold sores.). For 3-5 days 12/29/19   Huel Cote, NP  zonisamide (ZONEGRAN) 25 MG capsule Take 25 mg by mouth 4 (four) times daily. 09/16/19 02/05/20  [provider]    Family History Family History  Problem Relation Age of Onset  . Hypertension Mother   . Heart disease Mother   . Hypertension Father   . Prostate cancer Father 61  . Cancer Maternal Aunt        unknown type  . Stomach cancer Paternal Uncle        d. 29  . Leukemia Paternal Uncle   . Hypertension Sister   . Colon cancer Sister 35  . Hypertension Sister     Social History Social History   Tobacco Use  . Smoking status: Never Smoker  . Smokeless tobacco: Never Used  Substance Use Topics  . Alcohol use: Not Currently  . Drug use: No     Allergies   Nitrofurantoin monohyd macro   Review of Systems Review  of Systems  Reason unable to perform ROS: See HPI as above.     Physical Exam Triage Vital Signs ED Triage Vitals  Enc Vitals Group     BP 02/05/20 1033 118/81     Pulse Rate 02/05/20 1033 86     Resp 02/05/20 1033 18     Temp 02/05/20 1033 98.2 F (36.8 C)     Temp Source 02/05/20 1033 Oral     SpO2 02/05/20 1033 98 %     Weight --      Height --      Head Circumference --  Peak Flow --      Pain Score 02/05/20 1029 6     Pain Loc --      Pain Edu? --      Excl. in [place]? --    No data found.  Updated Vital Signs BP 118/81 (BP Location: Right Arm)   Pulse 86   Temp 98.2 F (36.8 C) (Oral)   Resp 18   LMP 08/21/2017   SpO2 98%   Physical Exam Constitutional:      General: She is not in acute distress.    Appearance: Normal appearance. She is not ill-appearing, toxic-appearing or diaphoretic.  HENT:     Head: Normocephalic and atraumatic.     Mouth/Throat:     Mouth: Mucous membranes are moist.     Pharynx: Oropharynx is clear. Uvula midline.  Cardiovascular:     Rate and Rhythm: Normal rate and regular rhythm.     Heart sounds: Normal heart sounds. No murmur. No friction rub. No gallop.   Pulmonary:     Effort: Pulmonary effort is normal. No accessory muscle usage, prolonged expiration, respiratory distress or retractions.     Comments: Lungs clear to auscultation without adventitious lung sounds. Musculoskeletal:     Cervical back: Normal range of motion and neck supple.     Comments: No tenderness to palpation of the back/ribs  Skin:    General: Skin is warm and dry.  Neurological:     General: No focal deficit present.     Mental Status: She is alert and oriented to person, place, and time.      UC Treatments / Results  Labs (all labs ordered are listed, but only abnormal results are displayed) Labs Reviewed - No data to display  EKG   Radiology DG Chest 2 View  Result Date: 02/05/2020 CLINICAL DATA:  Chest pain with cough EXAM: CHEST - 2  VIEW COMPARISON:  Chest radiograph and chest CT June 30, 2013 FINDINGS: Lungs are clear. Heart size and pulmonary vascularity are normal. No adenopathy. There is postoperative change in the left axillary region. There is postoperative change in the right breast region. IMPRESSION: Lungs clear.  Cardiac silhouette normal.  No evident adenopathy. Electronically Signed   By: Lowella Grip III M.D.   On: 02/05/2020 10:56    Procedures Procedures (including critical care time)  Medications Ordered in UC Medications - No data to display  Initial Impression / Assessment and Plan / UC Course  I have reviewed the triage vital signs and the nursing notes.  Pertinent labs & imaging results that were available during my care of the patient were reviewed by me and considered in my medical decision making (see chart for details).    Chest x-ray without pneumonia, interstitial edema, new masses.  At this time, will have patient continue azithromycin, and start prednisone as directed.  Symptomatic treatment for back provided with muscle relaxer.  Return precautions given.  If cough not well controlled, okay to call in Hycodan 58m Q12H PRN, D#654m R#0.  Final Clinical Impressions(s) / UC Diagnoses   Final diagnoses:  Acute bronchitis, unspecified organism    ED Prescriptions    Medication Sig Dispense Auth. Provider   predniSONE (DELTASONE) 50 MG tablet Take 1 tablet (50 mg total) by mouth daily with breakfast. 5 tablet Brieann Osinski V, PA-C   tiZANidine (ZANAFLEX) 2 MG tablet Take 1 tablet (2 mg total) by mouth every 6 (six) hours as needed for muscle spasms. 15 tablet YuOk Edwards  PA-C     I have reviewed the PDMP during this encounter.   Ok Edwards, PA-C 02/05/20 1126

## 2020-02-07 ENCOUNTER — Telehealth: Payer: Self-pay | Admitting: Physician Assistant

## 2020-02-07 MED ORDER — HYDROCODONE-HOMATROPINE 5-1.5 MG/5ML PO SYRP: 5.0000 mL | ORAL_SOLUTION | Freq: Two times a day (BID) | ORAL | 0 refills | Status: DC | PRN

## 2020-02-07 MED ORDER — IPRATROPIUM BROMIDE 0.06 % NA SOLN: 2.0000 | Freq: Four times a day (QID) | NASAL | 0 refills | Status: DC

## 2020-02-07 NOTE — Telephone Encounter (Signed)
Patient called back due to continued cough at night time. Started on prednisone with good relief of day time cough. But still waking up at night coughing. Will fill hycodan and atrovent nasal spray. Return precautions given.

## 2020-02-22 ENCOUNTER — Ambulatory Visit: Payer: Managed Care, Other (non HMO)

## 2020-02-25 ENCOUNTER — Ambulatory Visit: Payer: Managed Care, Other (non HMO)

## 2020-03-01 ENCOUNTER — Ambulatory Visit
Admission: RE | Admit: 2020-03-01 | Discharge: 2020-03-01 | Disposition: A | Discharge status: Home / Self Care | Payer: Managed Care, Other (non HMO) | Source: Ambulatory Visit | Attending: Hematology | Admitting: Hematology

## 2020-03-01 ENCOUNTER — Other Ambulatory Visit: Payer: Self-pay

## 2020-03-01 DIAGNOSIS — Z1231 Encounter for screening mammogram for malignant neoplasm of breast: Secondary | ICD-10-CM

## 2020-08-09 ENCOUNTER — Other Ambulatory Visit: Payer: Self-pay

## 2020-08-09 ENCOUNTER — Encounter: Payer: Self-pay | Admitting: Nurse Practitioner

## 2020-08-09 ENCOUNTER — Inpatient Hospital Stay (HOSPITAL_BASED_OUTPATIENT_CLINIC_OR_DEPARTMENT_OTHER): Payer: Managed Care, Other (non HMO) | Admitting: Nurse Practitioner

## 2020-08-09 ENCOUNTER — Telehealth: Payer: Self-pay | Admitting: Nurse Practitioner

## 2020-08-09 ENCOUNTER — Inpatient Hospital Stay: Payer: Managed Care, Other (non HMO) | Attending: Nurse Practitioner

## 2020-08-09 VITALS — BP 130/89 | HR 68 | Temp 97.8°F | Resp 20 | Ht 61.0 in | Wt 148.5 lb

## 2020-08-09 DIAGNOSIS — Z171 Estrogen receptor negative status [ER-]: Secondary | ICD-10-CM | POA: Diagnosis not present

## 2020-08-09 DIAGNOSIS — Z1231 Encounter for screening mammogram for malignant neoplasm of breast: Secondary | ICD-10-CM | POA: Diagnosis not present

## 2020-08-09 DIAGNOSIS — Z923 Personal history of irradiation: Secondary | ICD-10-CM | POA: Insufficient documentation

## 2020-08-09 DIAGNOSIS — Z800 Family history of malignant neoplasm of digestive organs: Secondary | ICD-10-CM | POA: Diagnosis not present

## 2020-08-09 DIAGNOSIS — Z881 Allergy status to other antibiotic agents status: Secondary | ICD-10-CM | POA: Diagnosis not present

## 2020-08-09 DIAGNOSIS — Z79899 Other long term (current) drug therapy: Secondary | ICD-10-CM | POA: Diagnosis not present

## 2020-08-09 DIAGNOSIS — Z853 Personal history of malignant neoplasm of breast: Secondary | ICD-10-CM | POA: Diagnosis not present

## 2020-08-09 DIAGNOSIS — C50311 Malignant neoplasm of lower-inner quadrant of right female breast: Secondary | ICD-10-CM | POA: Diagnosis not present

## 2020-08-09 DIAGNOSIS — Z8042 Family history of malignant neoplasm of prostate: Secondary | ICD-10-CM | POA: Diagnosis not present

## 2020-08-09 DIAGNOSIS — R200 Anesthesia of skin: Secondary | ICD-10-CM | POA: Diagnosis not present

## 2020-08-09 DIAGNOSIS — D500 Iron deficiency anemia secondary to blood loss (chronic): Secondary | ICD-10-CM

## 2020-08-09 LAB — CBC WITH DIFFERENTIAL/PLATELET
Abs Immature Granulocytes: 0.02 10*3/uL (ref 0.00–0.07)
Basophils Absolute: 0 10*3/uL (ref 0.0–0.1)
Basophils Relative: 0 %
Eosinophils Absolute: 0.1 10*3/uL (ref 0.0–0.5)
Eosinophils Relative: 1 %
HCT: 38.4 % (ref 36.0–46.0)
Hemoglobin: 12.4 g/dL (ref 12.0–15.0)
Immature Granulocytes: 0 %
Lymphocytes Relative: 28 %
Lymphs Abs: 1.8 10*3/uL (ref 0.7–4.0)
MCH: 28.1 pg (ref 26.0–34.0)
MCHC: 32.3 g/dL (ref 30.0–36.0)
MCV: 86.9 fL (ref 80.0–100.0)
Monocytes Absolute: 0.5 10*3/uL (ref 0.1–1.0)
Monocytes Relative: 8 %
Neutro Abs: 4.1 10*3/uL (ref 1.7–7.7)
Neutrophils Relative %: 63 %
Platelets: 254 10*3/uL (ref 150–400)
RBC: 4.42 MIL/uL (ref 3.87–5.11)
RDW: 14.7 % (ref 11.5–15.5)
WBC: 6.6 10*3/uL (ref 4.0–10.5)
nRBC: 0 % (ref 0.0–0.2)

## 2020-08-09 LAB — COMPREHENSIVE METABOLIC PANEL
ALT: 15 U/L (ref 0–44)
AST: 18 U/L (ref 15–41)
Albumin: 3.9 g/dL (ref 3.5–5.0)
Alkaline Phosphatase: 93 U/L (ref 38–126)
Anion gap: 7 (ref 5–15)
BUN: 15 mg/dL (ref 6–20)
CO2: 29 mmol/L (ref 22–32)
Calcium: 9.8 mg/dL (ref 8.9–10.3)
Chloride: 103 mmol/L (ref 98–111)
Creatinine, Ser: 0.8 mg/dL (ref 0.44–1.00)
GFR calc non Af Amer: >60 mL/min (ref >60)
Glucose, Bld: 84 mg/dL (ref 70–99)
Potassium: 3.8 mmol/L (ref 3.5–5.1)
Sodium: 139 mmol/L (ref 135–145)
Total Bilirubin: 0.4 mg/dL (ref 0.3–1.2)
Total Protein: 8.3 g/dL — ABNORMAL HIGH (ref 6.5–8.1)

## 2020-08-09 LAB — FERRITIN: Ferritin: 38 ng/mL (ref 11–307)

## 2020-08-09 LAB — IRON AND TIBC
Iron: 75 ug/dL (ref 41–142)
Saturation Ratios: 19 % — ABNORMAL LOW (ref 21–57)
TIBC: 396 ug/dL (ref 236–444)
UIBC: 321 ug/dL (ref 120–384)

## 2020-08-09 NOTE — Progress Notes (Signed)
[facility]   [phone] [phone]   Clinic Follow up Note   Patient Care Team: Lucianne Lei, MD as PCP - Charissa Bash, MD as Consulting Physician (Hematology) 08/09/2020  CHIEF COMPLAINT: Follow-up left breast cancer  CURRENT THERAPY: Surveillance  INTERVAL HISTORY: Ms. Hunnell returns for annual follow-up as scheduled.  She was last seen by Dr. Burr Medico on 08/06/2019.  She underwent screening breast MRI on 08/16/2019 which showed showed the development of a 5 x 4 x 5 mm mass in the lower inner right breast, biopsy showed atypical ductal hyperplasia.  She underwent lumpectomy on 10/06/2019 by Dr. Dalbert Batman, no evidence of malignancy.  Surgical path showed mild fibrotic change, negative for atypical hyperplasia or carcinoma.  Routine mammogram in 03/02/2020 was negative.  She has occasional left breast pain which is stable, denies new mass, nipple discharge, or new concerns.  She is feeling well, normal energy and appetite which is slightly low due to working from home this year.  Denies unintentional weight loss.  She thinks her right hip arthritis is slightly worse, pain worsens when she is laying on it.  This is not limiting activity or quality of life.  Applies Voltaren gel which is helpful.  Denies new bone pain.  She has some numbness in her lower legs occasionally, worse at night.  Denies changes in bowel habits, bleeding, new abdominal pain, any fever, chills, cough, chest pain, dyspnea.   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     Lymphedema of arm    DUE TO LEFT   AXILLARY NODE DISSECTION   AND RADIATION   Migraines    S/P cardiac catheterization, with normal coronary arteries and normal EF, 07/01/13 07/13/2013    SURGICAL HISTORY: Past Surgical History:  Procedure Laterality Date   AUGMENTATION MAMMAPLASTY  2008   BILATERAL TUBAL     BREAST BIOPSY     BREAST EXCISIONAL BIOPSY Right 10/2019   ADH   BREAST LUMPECTOMY Left 2002   AE 35   BREAST LUMPECTOMY WITH RADIOACTIVE SEED LOCALIZATION Right 10/06/2019   Procedure: RIGHT BREAST LUMPECTOMY WITH RADIOACTIVE SEED LOCALIZATION;  Surgeon: Fanny Skates, MD;  Location: [facility];  Service: General;  Laterality: Right;   CARDIAC CATHETERIZATION  07/01/13   Angiographically normal coronary arteries with normal left atrial function   HYSTEROSCOPY  12/10/07   RESECTOSCOPIC POLYPECTOMY/MYOMECTOMY   LEFT HEART CATHETERIZATION WITH CORONARY ANGIOGRAM N/A 07/01/2013   Procedure: LEFT HEART CATHETERIZATION WITH CORONARY ANGIOGRAM;  Surgeon: Leonie Man, MD;  Location: [facility] CATH LAB;  Service: Cardiovascular;  Laterality: N/A;   MASTECTOMY, PARTIAL  07/23/2001   LEFT BREAST WITH AXILLARY NODE EVALUATION   TUBAL LIGATION     BILATERAL    I have reviewed the social history and family history with the patient and they are unchanged from previous note.  ALLERGIES:  is allergic to nitrofurantoin monohyd macro.  MEDICATIONS:  Current Outpatient Medications  Medication Sig Dispense Refill   metoprolol tartrate (LOPRESSOR) 25 MG tablet Take 0.5 tablets (12.5 mg total) by mouth 2 (  two) times daily. (Patient taking differently: Take 12.5 mg by mouth daily. ) 30 tablet 11   TRIBENZOR 20-5-12.5 MG TABS Take 1 tablet by mouth daily.      diclofenac sodium (VOLTAREN) 1 % GEL Apply 1 application topically 4 (four) times daily as needed for pain.     HYDROcodone-homatropine (HYCODAN) 5-1.5 MG/5ML syrup Take 5 mLs by mouth every 12 (twelve) hours as needed for cough.  (Patient not taking: Reported on 08/09/2020) 60 mL 0   ibuprofen (ADVIL) 600 MG tablet TAKE 1 TABLET BY MOUTH EVERY 8 HOURS AS NEEDED (Patient not taking: Reported on 08/09/2020) 60 tablet 1   ipratropium (ATROVENT) 0.06 % nasal spray Place 2 sprays into both nostrils 4 (four) times daily. (Patient not taking: Reported on 08/09/2020) 15 mL 0   tiZANidine (ZANAFLEX) 2 MG tablet Take 1 tablet (2 mg total) by mouth every 6 (six) hours as needed for muscle spasms. (Patient not taking: Reported on 08/09/2020) 15 tablet 0   valACYclovir (VALTREX) 500 MG tablet Take 1 tablet (500 mg total) by mouth 2 (two) times daily as needed (fever blisters/cold sores.). For 3-5 days (Patient not taking: Reported on 08/09/2020) 30 tablet 6   No current facility-administered medications for this visit.    PHYSICAL EXAMINATION: ECOG PERFORMANCE STATUS: 0 - Asymptomatic  Vitals:   08/09/20 1203  BP: 130/89  Pulse: 68  Resp: 20  Temp: 97.8 F (36.6 C)  SpO2: 98%   Filed Weights   08/09/20 1203  Weight: 148 lb 8 oz (67.4 kg)    GENERAL:alert, no distress and comfortable SKIN: No rash to exposed skin EYES:  sclera clear NECK: Without mass LYMPH:  no palpable cervical or supraclavicular lymphadenopathy  LUNGS: clear with normal breathing effort HEART: regular rate & rhythm, no lower extremity edema Musculoskeletal: No focal tenderness NEURO: alert & oriented x 3 with fluent speech Breast exam: S/p bilateral lumpectomy and implants, nipples without inversion or discharge.  Incisions completely healed.  No palpable mass or nodularity in either breast or axilla that I could appreciate  LABORATORY DATA:  I have reviewed the data as listed CBC Latest Ref Rng & Units 08/09/2020 10/02/2019 08/06/2019  WBC 4.0 - 10.5 K/uL 6.6 6.7 7.2  Hemoglobin 12.0 - 15.0 g/dL 12.4 12.4 12.1  Hematocrit 36 - 46 % 38.4 38.3 37.3  Platelets 150 - 400 K/uL 254 295 250     CMP Latest Ref Rng & Units 08/09/2020 10/02/2019  08/06/2019  Glucose 70 - 99 mg/dL 84 95 86  BUN 6 - 20 mg/dL _0 Creatinine 0.44 - 1.00 mg/dL 0.80 0.90 0.89  Sodium 135 - 145 mmol/L 139 141 139  Potassium 3.5 - 5.1 mmol/L 3.8 3.6 3.7  Chloride 98 - 111 mmol/L 103 106 104  CO2 22 - 32 mmol/L _1 Calcium 8.9 - 10.3 mg/dL 9.8 9.3 9.5  Total Protein 6.5 - 8.1 g/dL 8.3(H) 7.5 7.7  Total Bilirubin 0.3 - 1.2 mg/dL 0.4 0.6 0.4  Alkaline Phos 38 - 126 U/L 93 74 83  AST 15 - 41 U/L _2 ALT 0 - 44 U/L _3 RADIOGRAPHIC STUDIES: I have personally reviewed the radiological images as listed and agreed with the findings in the report. No results found.   ASSESSMENT & PLAN: 54 yo female with   1. H/o T2N1 (9 node +) triple negative left breast carcinomain2002: -S/p left partial mastectomy. It has been  17years since her initial diagnosis, her risk of recurrence of previous triple negative breast cancer is minimal now. However she is at high risk for secondary breast cancer -She will continue annual screening mammogram and MRI every 1-2 years.  -she is s/p bilateral breast reconstructions, saline implants retropectoral  2.  Right breast ADH -Screening breast MRI on 08/16/2019 showed an indeterminate 5 mm mass in the lower inner quadrant of the right breast -Biopsy on 09/01/2019 showed a focus of lobular neoplasia/atypical ductal hyperplasia -S/p right lumpectomy per Dr. Dalbert Batman on 10/06/2019, final path showed mild fibrocystic change, negative for atypical hyperplasia or carcinoma  3.  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 Strough is clinically doing well.  She has recovered well from right lumpectomy in 10/2019, path was negative for hyperplasia or carcinoma.  She continues screening breast MRI and mammogram alternating every 6 months.  Last mammogram in 02/2020 is negative.   Breast exam today is benign, CBC, CMP, and iron studies are unremarkable.  Overall no clinical concern for recurrence or new breast cancer.  Next MRI due in 08/2020, mammogram in 02/2021, orders placed today.  Can repeat DEXA with mammogram. She was encouraged to continue follow-up with her routine care providers, stay up-to-date on age-appropriate cancer screenings, and consider the flu shot and COVID booster.  She will think about it.  Routine follow-up in 08/2021.  Orders Placed This Encounter  Procedures   MR BREAST BILATERAL W WO CONTRAST INC CAD    Epic ORDER PF: 03/01/2020 @ [facility]   HIGH RISK BRCA CYCLE: ???      Standing Status:   Future    Standing Expiration Date:   08/09/2021    Order Specific Question:   If indicated for the ordered procedure, I authorize the administration of contrast media per Radiology protocol    Answer:   Yes    Order Specific Question:   What is the patient's sedation requirement?    Answer:   No Sedation    Order Specific Question:   Does the patient have a pacemaker or implanted devices?    Answer:   No    Order Specific Question:   Preferred imaging location?    Answer:   [facility] (table limit-550lbs)   MM Digital Screening W/ Implants    Standing Status:   Future    Standing Expiration Date:   08/09/2021    Order Specific Question:   Reason for Exam (SYMPTOM  OR DIAGNOSIS REQUIRED)    Answer:   h/o left breast cancer, R breast ADH    Order Specific Question:   Is the patient pregnant?    Answer:   No    Order Specific Question:   Preferred imaging location?    Answer:   [facility]   DG Bone Density    Standing Status:   Future    Standing Expiration Date:   08/09/2021    Order Specific Question:   Reason for Exam (SYMPTOM  OR DIAGNOSIS REQUIRED)    Answer:   osteopenia    Order Specific Question:   Is the patient pregnant?    Answer:   No    Order Specific Question:   Preferred imaging location?    Answer:   [facility]     All questions were answered. The patient knows to call the clinic with any problems, questions or concerns.  No barriers to learning were detected.     Alla Feeling, NP 08/09/20

## 2020-08-09 NOTE — Telephone Encounter (Signed)
Scheduled per 10/5 los. Printed avs and calendar for pt.  

## 2020-08-11 ENCOUNTER — Encounter: Payer: Self-pay | Admitting: Nurse Practitioner

## 2020-08-30 ENCOUNTER — Other Ambulatory Visit: Payer: Managed Care, Other (non HMO)

## 2020-09-03 ENCOUNTER — Ambulatory Visit
Admission: RE | Admit: 2020-09-03 | Discharge: 2020-09-03 | Disposition: A | Discharge status: Home / Self Care | Payer: Managed Care, Other (non HMO) | Source: Ambulatory Visit | Attending: Nurse Practitioner | Admitting: Nurse Practitioner

## 2020-09-03 ENCOUNTER — Other Ambulatory Visit: Payer: Self-pay

## 2020-09-03 DIAGNOSIS — Z853 Personal history of malignant neoplasm of breast: Secondary | ICD-10-CM

## 2020-09-03 MED ORDER — GADOBUTROL 1 MMOL/ML IV SOLN
7.0000 mL | Freq: Once | INTRAVENOUS | Status: AC | PRN
  Administered 2020-09-03: 7 mL via INTRAVENOUS

## 2020-09-07 ENCOUNTER — Encounter: Payer: Self-pay | Admitting: Nurse Practitioner

## 2020-12-06 ENCOUNTER — Ambulatory Visit: Payer: Managed Care, Other (non HMO) | Admitting: Nurse Practitioner

## 2020-12-06 ENCOUNTER — Encounter: Payer: Self-pay | Admitting: Nurse Practitioner

## 2020-12-06 ENCOUNTER — Other Ambulatory Visit: Payer: Self-pay

## 2020-12-06 VITALS — BP 118/80 | Ht 61.0 in | Wt 148.0 lb

## 2020-12-06 DIAGNOSIS — Z01419 Encounter for gynecological examination (general) (routine) without abnormal findings: Secondary | ICD-10-CM

## 2020-12-06 DIAGNOSIS — Z853 Personal history of malignant neoplasm of breast: Secondary | ICD-10-CM

## 2020-12-06 DIAGNOSIS — N898 Other specified noninflammatory disorders of vagina: Secondary | ICD-10-CM | POA: Diagnosis not present

## 2020-12-06 LAB — WET PREP FOR TRICH, YEAST, CLUE

## 2020-12-06 NOTE — Progress Notes (Signed)
   Tara Bell 04-Jun-1966 [id number]   History:  55 y.o. G2P2 presents for annual exam. Complains of vaginal discharge without itching or odor. Postmenopausal - no HRT, no bleeding. 2001-01-28 left breast cancer at age 73 managed with lumpectomy, radiation, and chemotherapy. BRCA 1 negative, BRCA 2 indeterminant. 01/29/2019 benign right breast biopsy. Normal pap history. Sister deceased from colon cancer at age 97.  Osteopenia - T-score -2.4, declined treatment in the past. Has repeat DEXA in May due to continuous hip pain - order by oncology.   Gynecologic History Patient's last menstrual period was 08/21/2017.   Contraception/Family planning: post menopausal status  Health Maintenance Last Pap: 12/08/2018. Results were: normal Last mammogram: 03/01/2020. Results were: normal Breast MRI: 08/2020 normal Last colonoscopy: 28-Jan-2017. Results were: normal, 5 year recall Last Dexa: 12/2018. Results were: T-score -2.4  Past medical history, past surgical history, family history and social history were all reviewed and documented in the EPIC chart.  ROS:  A ROS was performed and pertinent positives and negatives are included.  Exam:  Vitals:   12/06/20 1600  BP: 118/80  Weight: 148 lb (67.1 kg)  Height: $Remove'5\' 1"'qniDtkC$  (1.549 m)   Body mass index is 27.96 kg/m.  General appearance:  Normal Thyroid:  Symmetrical, normal in size, without palpable masses or nodularity. Respiratory  Auscultation:  Clear without wheezing or rhonchi Cardiovascular  Auscultation:  Regular rate, without rubs, murmurs or gallops  Edema/varicosities:  Not grossly evident Abdominal  Soft,nontender, without masses, guarding or rebound.  Liver/spleen:  No organomegaly noted  Hernia:  None appreciated  Skin  Inspection:  Grossly normal   Breasts: Examined lying and sitting. Bilateral implants  Right: Without masses, retractions, discharge or axillary adenopathy.   Left: Without masses, retractions, discharge or  axillary adenopathy. Gentitourinary   Inguinal/mons:  Normal without inguinal adenopathy  External genitalia:  Normal  BUS/Urethra/Skene's glands:  Normal  Vagina:  Normal  Cervix:  Normal  Uterus:  20 week fibroid uterus, nontender  Adnexa/parametria:     Rt: Without masses or tenderness.   Lt: Without masses or tenderness.  Anus and perineum: Normal  Digital rectal exam: Normal sphincter tone without palpated masses or tenderness  Wet prep negative  Assessment/Plan:  55 y.o. G2P2 for annual exam.   Well female exam with routine gynecological exam - Plan: Comprehensive metabolic panel, Lipid panel, CBC with Differential/Platelet. Education provided on SBEs, importance of preventative screenings, current guidelines, high calcium diet, regular exercise, and multivitamin daily. She will return for fasting labs.  Vaginal discharge - Plan: [place] [facility], CLUE. Normal wet prep and exam.   Screening for cervical cancer - Normal Pap history.  Will repeat at 5-year interval per guidelines.  Screening for breast cancer - Jan 28, 2001 left breast cancer managed with lumpectomy, radiation, and chemotherapy. Normal mammogram April 2021, normal MRI October 2021. Continue annual screenings.  Normal breast exam today. BRCA 1 negative BRCA 2 indeterminate.   Screening for colon cancer - 01-28-2017 colonoscopy. Sister deceased from colon cancer. Will repeat at GI's recommended interval.   Follow up in 1 year for annual.    [place], 4:16 PM 12/06/2020

## 2020-12-06 NOTE — Patient Instructions (Signed)

## 2021-01-06 ENCOUNTER — Other Ambulatory Visit: Payer: Self-pay

## 2021-01-06 NOTE — Telephone Encounter (Signed)
Medication refill request: Valtrex Last AEX:  12/06/20  Next AEX: none Last MMG (if hormonal medication request): n/a Refill authorized: today, please advise

## 2021-01-08 MED ORDER — VALACYCLOVIR HCL 500 MG PO TABS: 500.0000 mg | ORAL_TABLET | Freq: Two times a day (BID) | ORAL | 6 refills | Status: AC | PRN

## 2021-02-20 ENCOUNTER — Telehealth: Payer: Self-pay

## 2021-02-20 NOTE — Telephone Encounter (Signed)
Patient called to ask what she reported as LMP at last visit. Advised 08/21/2017. She said she wanted to be sure because she has visit with her PCP on Thursday because she has seen some red spotting when she urinates and wipes.  She has seen it on pantyliner too.  She is not sure where it is coming from. I offered her to schedule a visit here and told her if it is vaginal bleeding very important to make sure the uterine lining is normal since she is menopausal. She wants to keep appt with PCP on Thursday but said she may call back and make appt here as well.

## 2021-03-02 ENCOUNTER — Ambulatory Visit: Payer: Managed Care, Other (non HMO)

## 2021-03-02 ENCOUNTER — Other Ambulatory Visit: Payer: Managed Care, Other (non HMO)

## 2021-03-24 ENCOUNTER — Ambulatory Visit
Admission: RE | Admit: 2021-03-24 | Discharge: 2021-03-24 | Disposition: A | Discharge status: Home / Self Care | Payer: Managed Care, Other (non HMO) | Source: Ambulatory Visit | Attending: Nurse Practitioner | Admitting: Nurse Practitioner

## 2021-03-24 ENCOUNTER — Other Ambulatory Visit: Payer: Self-pay

## 2021-03-24 DIAGNOSIS — Z1231 Encounter for screening mammogram for malignant neoplasm of breast: Secondary | ICD-10-CM

## 2021-03-24 DIAGNOSIS — Z853 Personal history of malignant neoplasm of breast: Secondary | ICD-10-CM

## 2021-08-09 ENCOUNTER — Inpatient Hospital Stay: Payer: Managed Care, Other (non HMO)

## 2021-08-09 ENCOUNTER — Inpatient Hospital Stay: Payer: Managed Care, Other (non HMO) | Admitting: Hematology

## 2021-09-01 ENCOUNTER — Inpatient Hospital Stay: Payer: Managed Care, Other (non HMO) | Admitting: Hematology

## 2021-09-01 ENCOUNTER — Encounter: Payer: Self-pay | Admitting: Hematology

## 2021-09-01 ENCOUNTER — Other Ambulatory Visit: Payer: Self-pay

## 2021-09-01 ENCOUNTER — Inpatient Hospital Stay: Payer: Managed Care, Other (non HMO) | Attending: Hematology

## 2021-09-01 VITALS — BP 117/77 | HR 71 | Temp 98.4°F | Resp 16 | Ht 61.0 in | Wt 147.7 lb

## 2021-09-01 DIAGNOSIS — I10 Essential (primary) hypertension: Secondary | ICD-10-CM | POA: Insufficient documentation

## 2021-09-01 DIAGNOSIS — Z853 Personal history of malignant neoplasm of breast: Secondary | ICD-10-CM

## 2021-09-01 DIAGNOSIS — Z800 Family history of malignant neoplasm of digestive organs: Secondary | ICD-10-CM | POA: Diagnosis not present

## 2021-09-01 DIAGNOSIS — M8580 Other specified disorders of bone density and structure, unspecified site: Secondary | ICD-10-CM | POA: Insufficient documentation

## 2021-09-01 DIAGNOSIS — D500 Iron deficiency anemia secondary to blood loss (chronic): Secondary | ICD-10-CM

## 2021-09-01 DIAGNOSIS — C50019 Malignant neoplasm of nipple and areola, unspecified female breast: Secondary | ICD-10-CM

## 2021-09-01 LAB — COMPREHENSIVE METABOLIC PANEL
ALT: 14 U/L (ref 0–44)
AST: 19 U/L (ref 15–41)
Albumin: 4 g/dL (ref 3.5–5.0)
Alkaline Phosphatase: 93 U/L (ref 38–126)
Anion gap: 10 (ref 5–15)
BUN: 14 mg/dL (ref 6–20)
CO2: 28 mmol/L (ref 22–32)
Calcium: 9.9 mg/dL (ref 8.9–10.3)
Chloride: 101 mmol/L (ref 98–111)
Creatinine, Ser: 0.9 mg/dL (ref 0.44–1.00)
GFR, Estimated: >60 mL/min (ref >60)
Glucose, Bld: 111 mg/dL — ABNORMAL HIGH (ref 70–99)
Potassium: 3.6 mmol/L (ref 3.5–5.1)
Sodium: 139 mmol/L (ref 135–145)
Total Bilirubin: 0.5 mg/dL (ref 0.3–1.2)
Total Protein: 8.4 g/dL — ABNORMAL HIGH (ref 6.5–8.1)

## 2021-09-01 LAB — CBC WITH DIFFERENTIAL/PLATELET
Abs Immature Granulocytes: 0.03 10*3/uL (ref 0.00–0.07)
Basophils Absolute: 0 10*3/uL (ref 0.0–0.1)
Basophils Relative: 0 %
Eosinophils Absolute: 0.1 10*3/uL (ref 0.0–0.5)
Eosinophils Relative: 1 %
HCT: 38.6 % (ref 36.0–46.0)
Hemoglobin: 12.8 g/dL (ref 12.0–15.0)
Immature Granulocytes: 0 %
Lymphocytes Relative: 22 %
Lymphs Abs: 1.9 10*3/uL (ref 0.7–4.0)
MCH: 28.9 pg (ref 26.0–34.0)
MCHC: 33.2 g/dL (ref 30.0–36.0)
MCV: 87.1 fL (ref 80.0–100.0)
Monocytes Absolute: 0.5 10*3/uL (ref 0.1–1.0)
Monocytes Relative: 6 %
Neutro Abs: 6.2 10*3/uL (ref 1.7–7.7)
Neutrophils Relative %: 71 %
Platelets: 273 10*3/uL (ref 150–400)
RBC: 4.43 MIL/uL (ref 3.87–5.11)
RDW: 14.8 % (ref 11.5–15.5)
WBC: 8.7 10*3/uL (ref 4.0–10.5)
nRBC: 0 % (ref 0.0–0.2)

## 2021-09-01 LAB — IRON AND TIBC
Iron: 58 ug/dL (ref 41–142)
Saturation Ratios: 15 % — ABNORMAL LOW (ref 21–57)
TIBC: 396 ug/dL (ref 236–444)
UIBC: 338 ug/dL (ref 120–384)

## 2021-09-01 LAB — FERRITIN: Ferritin: 32 ng/mL (ref 11–307)

## 2021-09-01 NOTE — Progress Notes (Signed)
[place]   Telephone:(336) [phone] Fax:(336) [phone]   Clinic Follow up Note   Patient Care Team: Gerald Leitz as PCP - General (Physician Assistant) Truitt Merle, MD as Consulting Physician (Hematology)  Date of Service:  09/01/2021  CHIEF COMPLAINT: f/u of left breast cancer  CURRENT THERAPY:  Surveillance  ASSESSMENT & PLAN:  Tara Bell is a 55 y.o. female with   1. 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 It has been 20 years since her initial diagnosis, her risk of recurrence of previous triple negative breast cancer is minimal now. However she is at high risk for secondary breast cancer -she is s/p bilateral breast reconstructions, saline implants retropectoral -She will continue annual screening mammogram and MRI every 1-2 years.  -last mammogram 03/2021 and MRI 08/2020 were negative. I will order MRI to be done soon.   2.  Right breast ADH -Screening breast MRI on 08/16/2019 showed an indeterminate 5 mm mass in the lower inner quadrant of the right breast -Biopsy on 09/01/2019 showed a focus of lobular neoplasia/atypical ductal hyperplasia -S/p right lumpectomy per Dr. Dalbert Batman on 10/06/2019, final path showed mild fibrocystic change, negative for atypical hyperplasia or carcinoma   3.  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 She is up to date with colonoscopy. -she asked about her daughters, who are now in their late 2023-01-15. I recommend they talk to their PCP, as they may need MRI if their breasts are dense.  4. Osteopenia -DEXA on 03/24/21 showed osteopenia (T-score of -1.8) -I recommend she take vit D and calcium. I advised her this is OTC, and she can get it as a  combination.   PLAN: -breast MRI to be done in next 2-4 weeks -f/u open   No problem-specific Assessment & Plan notes found for this encounter.   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  The [facility] gene panel offered by [employer] includes sequencing and deletion/duplication testing of the following 25 genes: APC, ATM, BARD1, BMPR1A, BRCA1, BRCA2, BRIP1, CHD1, CDK4, CDKN2A, CHEK2, EPCAM (large rearrangement only), MLH1, MSH2, MSH6, MUTYH, NBN, PALB2, PMS2, PTEN, RAD51C, RAD51D, SMAD4, STK11, and TP53.  The report date is February 13, 2013.   12/24/2017 Mammogram   12/24/2017 Mammogram IMPRESSION: No mammographic evidence of malignancy. A result letter of this screening mammogram will be mailed directly to the patient.    01/09/2018 Breast MRI   01/09/2018 MRI Breast IMPRESSION: No MRI evidence of malignancy. Previously biopsied fibroadenoma in the right breast.      INTERVAL HISTORY:  Tara Bell is here for a follow up of breast cancer. She was last seen by NP Lacie on 08/09/20. She presents to the clinic alone. She reports feeling well, no concerns. She notes she has a bump on her face that has been present the last 3 years but she notes has grown lately.   All other systems were reviewed with the patient and are negative.  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    Lymphedema of arm    DUE TO LEFT   AXILLARY NODE DISSECTION   AND RADIATION   Migraines    S/P cardiac catheterization, with normal coronary arteries and normal EF, 07/01/13 07/13/2013    SURGICAL HISTORY: Past Surgical History:  Procedure Laterality Date   AUGMENTATION MAMMAPLASTY  2008   BILATERAL TUBAL     BREAST BIOPSY     BREAST EXCISIONAL BIOPSY Right 10/2019   ADH   BREAST LUMPECTOMY Left 2002   AE 35   BREAST LUMPECTOMY WITH RADIOACTIVE SEED LOCALIZATION Right 10/06/2019   Procedure: RIGHT BREAST LUMPECTOMY WITH RADIOACTIVE SEED LOCALIZATION;  Surgeon: Fanny Skates, MD;  Location: [facility];  Service: General;  Laterality: Right;   CARDIAC CATHETERIZATION  07/01/13   Angiographically normal coronary arteries with normal left atrial function   HYSTEROSCOPY  12/10/07   RESECTOSCOPIC POLYPECTOMY/MYOMECTOMY   LEFT HEART CATHETERIZATION WITH CORONARY ANGIOGRAM N/A 07/01/2013   Procedure: LEFT HEART CATHETERIZATION WITH CORONARY ANGIOGRAM;  Surgeon: Leonie Man, MD;  Location: [facility] CATH LAB;  Service: Cardiovascular;  Laterality: N/A;   MASTECTOMY, PARTIAL  07/23/2001   LEFT BREAST WITH AXILLARY NODE EVALUATION   TUBAL LIGATION     BILATERAL    I have reviewed the social history and family history with the patient and they are unchanged from previous note.  ALLERGIES:  is allergic to nitrofurantoin monohyd macro.  MEDICATIONS:  Current Outpatient Medications  Medication Sig Dispense Refill   ibuprofen (ADVIL) 600 MG tablet TAKE 1 TABLET BY MOUTH EVERY 8 HOURS AS NEEDED 60 tablet 1   metoprolol tartrate (LOPRESSOR) 25 MG tablet Take 0.5 tablets (12.5 mg total) by mouth 2 (two) times daily. (Patient taking differently: Take 12.5 mg by mouth daily.) 30 tablet 11   TRIBENZOR 20-5-12.5 MG TABS Take 1 tablet by mouth daily.      valACYclovir (VALTREX) 500 MG tablet Take 1 tablet (500 mg total) by mouth 2 (two) times daily as needed  (fever blisters/cold sores.). For 3-5 days 30 tablet 6   No current facility-administered medications for this visit.    PHYSICAL EXAMINATION: ECOG PERFORMANCE STATUS: 0 - Asymptomatic  Vitals:   09/01/21 1450  BP: 117/77  Pulse: 71  Resp: 16  Temp: 98.4 F (36.9 C)  SpO2: 100%   Wt Readings from Last 3 Encounters:  09/01/21 147 lb 11.2 oz (67 kg)  12/06/20 148 lb (67.1 kg)  08/09/20 148 lb 8 oz (67.4 kg)     GENERAL:alert, no distress and comfortable SKIN: skin color, texture, turgor are normal, no rashes or significant lesions EYES: normal, Conjunctiva are pink and non-injected, sclera clear  NECK: supple, thyroid normal size, non-tender, without nodularity LYMPH:  no palpable lymphadenopathy in the cervical, axillary  LUNGS: clear to auscultation and percussion with normal breathing effort HEART: regular rate & rhythm and no murmurs and no lower extremity edema ABDOMEN:abdomen soft, non-tender and normal bowel sounds Musculoskeletal:no cyanosis of digits and no clubbing  NEURO: alert & oriented x 3 with fluent speech, no focal motor/sensory deficits BREAST: No palpable mass, nodules or adenopathy bilaterally. Breast exam benign.  LABORATORY DATA:  I have reviewed the data as listed CBC Latest Ref Rng & Units 09/01/2021 08/09/2020 10/02/2019  WBC 4.0 - 10.5 K/uL 8.7 6.6 6.7  Hemoglobin 12.0 - 15.0 g/dL 12.8 12.4 12.4  Hematocrit 36.0 - 46.0 % 38.6 38.4 38.3  Platelets 150 - 400 K/uL 273 254 295     CMP Latest Ref Rng & Units 09/01/2021 08/09/2020 10/02/2019  Glucose 70 - 99 mg/dL 111(H) 84 95  BUN 6 - 20 mg/dL _0 Creatinine 0.44 - 1.00 mg/dL 0.90 0.80 0.90  Sodium 135 - 145 mmol/L 139 139 141  Potassium 3.5 - 5.1 mmol/L 3.6 3.8 3.6  Chloride 98 - 111 mmol/L 101 103 106  CO2 22 - 32 mmol/L _1 Calcium 8.9 - 10.3 mg/dL 9.9 9.8 9.3  Total Protein 6.5 - 8.1 g/dL 8.4(H) 8.3(H) 7.5  Total Bilirubin 0.3 - 1.2 mg/dL 0.5 0.4 0.6  Alkaline Phos 38 - 126 U/L  93 93 74  AST 15 - 41 U/L _2 ALT 0 - 44 U/L _3 RADIOGRAPHIC STUDIES: I have personally reviewed the radiological images as listed and agreed with the findings in the report. No results found.    Orders Placed This Encounter  Procedures   MR BREAST BILATERAL W WO CONTRAST INC CAD    [employer] Epic ORDER NO COVID PF:03-24-2021 BCG CYCLE:N/A DX: HX OF LT BREAST CANCER WT:147 HT:5'0 NO NEEDS/NO CLAUS/ NO METAL REMOVED/ BREAST IMPLANTS BREAST CLIP/ NO BULLETS OR BB'S/ NO GLUCOSE MONITOR, SPINAL STIMULATOR OR INJECTORS/  PREV SX/ NO BRAIN HEART EYE OR EAR SX NKDA TO IV DYE CONTRAST DP AND PT ORDER CHECKED DP 09-01-2021 ** PT AWARE OF 75$ NO SHOW FEE**    Standing Status:   Future    Standing Expiration Date:   09/01/2022    Order Specific Question:   If indicated for the ordered procedure, I authorize the administration of contrast media per Radiology protocol    Answer:   Yes    Order Specific Question:   What is the patient's sedation requirement?    Answer:   No Sedation    Order Specific Question:   Does the patient have a pacemaker or implanted devices?    Answer:   No    Order Specific Question:   Radiology Contrast Protocol - do NOT remove file path    Answer:   [email or web address]    Order Specific Question:   Preferred imaging location?    Answer:   [facility] (table limit-550lbs)   All questions were answered. The patient knows to call the clinic with any problems, questions or concerns. No barriers to learning was detected. The total time spent in the appointment was 25 minutes.     Truitt Merle, MD 09/01/2021   I, Wilburn Mylar, am acting as scribe for Truitt Merle, MD.   I have reviewed the above documentation for accuracy and completeness, and I agree with the above.

## 2021-09-06 ENCOUNTER — Telehealth: Payer: Self-pay

## 2021-09-06 NOTE — Telephone Encounter (Signed)
Pt called with concerns about her labs drawn on 09/01/2021.  Pt questioned glucose 111 result.  Informed pt that her labs are stable w/no real concerns.  Informed pt that the glucose/labs were drawn after 2pm which the pt probably had eaten lunch or drunk something with sugar which will cause the glucose lab to be slightly elevated.  Pt verbalized understanding and had no further questions at this time.

## 2021-09-16 ENCOUNTER — Other Ambulatory Visit: Payer: Managed Care, Other (non HMO)

## 2021-09-30 ENCOUNTER — Ambulatory Visit
Admission: RE | Admit: 2021-09-30 | Discharge: 2021-09-30 | Disposition: A | Discharge status: Home / Self Care | Payer: Managed Care, Other (non HMO) | Source: Ambulatory Visit | Attending: Hematology | Admitting: Hematology

## 2021-09-30 ENCOUNTER — Other Ambulatory Visit: Payer: Self-pay

## 2021-09-30 DIAGNOSIS — Z853 Personal history of malignant neoplasm of breast: Secondary | ICD-10-CM

## 2021-09-30 MED ORDER — GADOBUTROL 1 MMOL/ML IV SOLN
7.0000 mL | Freq: Once | INTRAVENOUS | Status: AC | PRN
  Administered 2021-09-30: 7 mL via INTRAVENOUS

## 2021-11-24 ENCOUNTER — Ambulatory Visit (INDEPENDENT_AMBULATORY_CARE_PROVIDER_SITE_OTHER): Payer: Managed Care, Other (non HMO) | Admitting: Plastic Surgery

## 2021-11-24 ENCOUNTER — Other Ambulatory Visit: Payer: Self-pay

## 2021-11-24 VITALS — BP 121/78 | HR 74 | Ht 60.0 in | Wt 148.8 lb

## 2021-11-24 DIAGNOSIS — D489 Neoplasm of uncertain behavior, unspecified: Secondary | ICD-10-CM

## 2021-11-26 NOTE — Progress Notes (Signed)
Referring Provider Ashley Royalty, [address],  [place] [id number]   CC:  Left cheek cyst   Tara Bell is an 56 y.o. female.  HPI: Patient is a 56 year old with a left cheek cyst.  This is present near the jawline under the ear.  Been present since 2017 or 2018.  She desires excision.  Allergies  Allergen Reactions   Nitrofurantoin Monohyd Macro Nausea And Vomiting    Outpatient Encounter Medications as of 11/24/2021  Medication Sig Note   ibuprofen (ADVIL) 600 MG tablet TAKE 1 TABLET BY MOUTH EVERY 8 HOURS AS NEEDED    metoprolol tartrate (LOPRESSOR) 25 MG tablet Take 0.5 tablets (12.5 mg total) by mouth 2 (two) times daily. (Patient taking differently: Take 12.5 mg by mouth daily.)    TRIBENZOR 20-5-12.5 MG TABS Take 1 tablet by mouth daily.     valACYclovir (VALTREX) 500 MG tablet Take 1 tablet (500 mg total) by mouth 2 (two) times daily as needed (fever blisters/cold sores.). For 3-5 days    [DISCONTINUED] zonisamide (ZONEGRAN) 25 MG capsule Take 25 mg by mouth 4 (four) times daily. 09/18/2019: On hold until current situation is resolved    No facility-administered encounter medications on file as of 11/24/2021.     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    Lymphedema of arm    DUE TO LEFT   AXILLARY NODE DISSECTION   AND RADIATION   Migraines    S/P cardiac catheterization, with normal coronary arteries and normal EF, 07/01/13 07/13/2013    Past Surgical History:  Procedure Laterality Date   AUGMENTATION MAMMAPLASTY  2008   BILATERAL TUBAL      BREAST BIOPSY     BREAST EXCISIONAL BIOPSY Right 10/2019   ADH   BREAST LUMPECTOMY Left 2002   AE 35   BREAST LUMPECTOMY WITH RADIOACTIVE SEED LOCALIZATION Right 10/06/2019   Procedure: RIGHT BREAST LUMPECTOMY WITH RADIOACTIVE SEED LOCALIZATION;  Surgeon: Fanny Skates, MD;  Location: [facility];  Service: General;  Laterality: Right;   CARDIAC CATHETERIZATION  07/01/13   Angiographically normal coronary arteries with normal left atrial function   HYSTEROSCOPY  12/10/07   RESECTOSCOPIC POLYPECTOMY/MYOMECTOMY   LEFT HEART CATHETERIZATION WITH CORONARY ANGIOGRAM N/A 07/01/2013   Procedure: LEFT HEART CATHETERIZATION WITH CORONARY ANGIOGRAM;  Surgeon: Leonie Man, MD;  Location: [facility] C[facility] LAB;  Service: Cardiovascular;  Laterality: N/A;   MASTECTOMY, PARTIAL  07/23/2001   LEFT BREAST WITH AXILLARY NODE EVALUATION   TUBAL LIGATION     BILATERAL    Family History  Problem Relation Age of Onset   Hypertension Mother    Heart disease Mother    Hypertension Father    Prostate cancer Father 68   Cancer Maternal Aunt        unknown type   Stomach cancer Paternal Uncle        d. 38   Leukemia Paternal Uncle    Hypertension Sister    Colon cancer Sister 4   Hypertension Sister     Social History  Social History Narrative   Not on file     Review of Systems General: Denies fevers, chills, weight loss CV: Denies chest pain, shortness of breath, palpitations   Physical Exam Vitals with BMI 11/24/2021 09/01/2021 12/06/2020  Height 5' 0" 5' 1" 5' 1"  Weight 148 lbs 13 oz 147 lbs 11 oz 148 lbs  BMI 29.06 57.26 20.35  Systolic 597 416 384  Diastolic 78 77 80  Pulse 74 71 -    General:  No acute distress,  Alert and oriented, Non-Toxic, Normal speech and affect HEENT: 1 cm left cheek cyst   Assessment/Plan Excision under local indicated discussed risks and benefits and expected scarring.  [name] 11/26/2021, 2:33 PM

## 2021-11-30 ENCOUNTER — Telehealth: Payer: Self-pay

## 2021-11-30 NOTE — Telephone Encounter (Signed)
Patient is having procedure with [place] on  12/08/21. She is currently taking Prednisone and will finish the course on 12/06/21. She would like to know if this will affect her procedure.

## 2021-12-08 ENCOUNTER — Ambulatory Visit: Payer: Managed Care, Other (non HMO) | Admitting: Plastic Surgery

## 2021-12-22 ENCOUNTER — Ambulatory Visit: Payer: Managed Care, Other (non HMO) | Admitting: Plastic Surgery

## 2021-12-25 ENCOUNTER — Ambulatory Visit: Payer: Managed Care, Other (non HMO) | Admitting: Plastic Surgery

## 2022-01-05 ENCOUNTER — Ambulatory Visit: Payer: Managed Care, Other (non HMO) | Admitting: Plastic Surgery

## 2022-01-12 ENCOUNTER — Other Ambulatory Visit: Payer: Self-pay

## 2022-01-12 ENCOUNTER — Ambulatory Visit (INDEPENDENT_AMBULATORY_CARE_PROVIDER_SITE_OTHER): Payer: Managed Care, Other (non HMO) | Admitting: Plastic Surgery

## 2022-01-12 ENCOUNTER — Other Ambulatory Visit (HOSPITAL_COMMUNITY)
Admission: RE | Admit: 2022-01-12 | Discharge: 2022-01-12 | Disposition: A | Discharge status: Home / Self Care | Payer: Managed Care, Other (non HMO) | Source: Ambulatory Visit | Attending: Plastic Surgery | Admitting: Plastic Surgery

## 2022-01-12 VITALS — BP 130/79 | HR 69 | Ht 60.0 in | Wt 151.6 lb

## 2022-01-12 DIAGNOSIS — D489 Neoplasm of uncertain behavior, unspecified: Secondary | ICD-10-CM

## 2022-01-12 NOTE — Progress Notes (Signed)
Operative Note  ? ?DATE OF OPERATION: 01/12/2022 ? ?LOCATION:   ? ?SURGICAL DEPARTMENT: Plastic Surgery ? ?PREOPERATIVE DIAGNOSES:  right face cystic lesion ? ?POSTOPERATIVE DIAGNOSES:  same ? ?PROCEDURE:  ?Excision of right face measuring 1.5 cm ?Intermediate closure right face measuring 1.6 cm ? ?SURGEON: Melene Plan. Shama Monfils, MD ? ?ANESTHESIA:  Local ? ?COMPLICATIONS: None.  ? ?INDICATIONS FOR PROCEDURE:  ?The patient, Tara Bell is a 56 y.o. female born on [birth year], is here for treatment of right face cystic lesion. ?MRN: [id number] ? ?CONSENT:  ?Informed consent was obtained directly from the patient. Risks, benefits and alternatives were fully discussed. Specific risks including but not limited to bleeding, infection, hematoma, seroma, scarring, pain, infection, wound healing problems, and need for further surgery were all discussed. The patient did have an ample opportunity to have questions answered to satisfaction.  ? ?DESCRIPTION OF PROCEDURE:  ?Local anesthesia was administered. The patient's operative site was prepped and draped in a sterile fashion. A time out was performed and all information was confirmed to be correct.  The lesion was excised with a 15 blade followed by scissors for sharp and blunt dissection.  Hemostasis was obtained.  Circumferential undermining was performed and the skin was advanced and closed in layers with interrupted buried Monocryl sutures and Monocryl subcuticular for the skin.  The lesion excised measured 1.5 cm, and the total length of closure measured 1.6 cm.   ? ?The patient tolerated the procedure well.  There were no complications. ?  ?

## 2022-01-16 LAB — SURGICAL PATHOLOGY

## 2022-01-19 ENCOUNTER — Telehealth: Payer: Self-pay

## 2022-01-19 NOTE — Telephone Encounter (Signed)
Contacted pt's with results of path report. She conveyed understanding.  ?

## 2022-01-19 NOTE — Telephone Encounter (Signed)
Patient called to find out the results of her biopsy.  Please call. ?

## 2022-01-26 ENCOUNTER — Ambulatory Visit: Payer: Managed Care, Other (non HMO) | Admitting: Plastic Surgery

## 2022-01-29 ENCOUNTER — Ambulatory Visit: Payer: Managed Care, Other (non HMO) | Admitting: Physician Assistant

## 2022-01-29 ENCOUNTER — Other Ambulatory Visit: Payer: Self-pay

## 2022-01-29 DIAGNOSIS — D489 Neoplasm of uncertain behavior, unspecified: Secondary | ICD-10-CM

## 2022-01-29 NOTE — Progress Notes (Signed)
Patient is a 56 year old female with PMH of left cheek cyst s/p excision performed 01/12/2022 in clinic by Dr. Erin Hearing who presents to clinic for postprocedural follow-up. ? ?Reviewed procedural note and the excision site was closed with subcuticular Monocryl suture.  Specimen was sent and pathology was consistent with benign epidermal cyst, no evidence of malignancy. ? ?Today, patient is doing well.  She denies any wounds or dehiscence.  She does report that there is a small bump at the superior aspect of the excision site.  It is not particularly painful.  Denies any surrounding skin changes. ? ?Physical exam is entirely reassuring.  No wounds or dehiscence.  No surrounding erythema or cellulitic changes.  There is a small bump at the superior aspect, suspect where the subcuticular closure was tied. ? ?Suspect that the patient's small bump will soften as the Monocryl suture absorbs.  Informed her that this will likely soften over the course the next couple months.  No external suture here for me to remove in clinic.  Skin looks excellent. ? ?No specific follow-up needed.  She can certainly call the clinic and return should she develop any questions or concerns.  Recommending silicone-based scar gel such as Willeen Niece given that this is on the face.  Denies any personal history of keloiding or hypertrophic scarring. ? ?Picture(s) obtained of the patient and placed in the chart were with the patient's or guardian's permission. ? ? ? ?

## 2022-02-08 ENCOUNTER — Other Ambulatory Visit: Payer: Self-pay | Admitting: Physician Assistant

## 2022-02-08 DIAGNOSIS — Z1231 Encounter for screening mammogram for malignant neoplasm of breast: Secondary | ICD-10-CM

## 2022-02-13 ENCOUNTER — Telehealth: Payer: Self-pay | Admitting: Plastic Surgery

## 2022-02-13 NOTE — Telephone Encounter (Signed)
Patient calling to state that she spoke with someone last week regarding the scar claim. Her insurance told her that her FSA would pay for the procedure if it was medical instead of cosmetic. She has sent over a form and did not know who to attn it to. She would like someone to call her back about the form, the claim, and whether she can upload the form online instead of just faxing it.  ?

## 2022-02-14 NOTE — Telephone Encounter (Signed)
Called and spoke with the patient and informed her she can fax the FSA form to [employer], and he can complete it and fax it back to her.  Patient verbalized understanding and agreed.//AB/CMA ?

## 2022-04-03 ENCOUNTER — Ambulatory Visit
Admission: RE | Admit: 2022-04-03 | Discharge: 2022-04-03 | Disposition: A | Discharge status: Home / Self Care | Payer: Managed Care, Other (non HMO) | Source: Ambulatory Visit | Attending: Physician Assistant | Admitting: Physician Assistant

## 2022-04-03 DIAGNOSIS — Z1231 Encounter for screening mammogram for malignant neoplasm of breast: Secondary | ICD-10-CM

## 2022-04-26 ENCOUNTER — Other Ambulatory Visit: Payer: Self-pay | Admitting: Hematology

## 2022-04-26 DIAGNOSIS — Z853 Personal history of malignant neoplasm of breast: Secondary | ICD-10-CM

## 2022-05-11 ENCOUNTER — Other Ambulatory Visit: Payer: Managed Care, Other (non HMO)

## 2022-05-16 ENCOUNTER — Other Ambulatory Visit: Payer: Self-pay

## 2022-05-25 ENCOUNTER — Other Ambulatory Visit: Payer: Managed Care, Other (non HMO)

## 2022-06-06 ENCOUNTER — Other Ambulatory Visit: Payer: Self-pay | Admitting: Nurse Practitioner

## 2022-06-06 DIAGNOSIS — Z853 Personal history of malignant neoplasm of breast: Secondary | ICD-10-CM

## 2022-06-06 DIAGNOSIS — N644 Mastodynia: Secondary | ICD-10-CM

## 2022-06-14 ENCOUNTER — Ambulatory Visit
Admission: RE | Admit: 2022-06-14 | Discharge: 2022-06-14 | Disposition: A | Discharge status: Home / Self Care | Payer: Managed Care, Other (non HMO) | Source: Ambulatory Visit | Attending: Nurse Practitioner | Admitting: Nurse Practitioner

## 2022-06-14 DIAGNOSIS — N644 Mastodynia: Secondary | ICD-10-CM

## 2022-06-14 DIAGNOSIS — Z853 Personal history of malignant neoplasm of breast: Secondary | ICD-10-CM

## 2022-07-19 ENCOUNTER — Other Ambulatory Visit: Payer: Self-pay

## 2022-09-14 ENCOUNTER — Ambulatory Visit
Admission: EM | Admit: 2022-09-14 | Discharge: 2022-09-14 | Disposition: A | Discharge status: Home / Self Care | Payer: Managed Care, Other (non HMO) | Attending: Physician Assistant | Admitting: Physician Assistant

## 2022-09-14 ENCOUNTER — Encounter: Payer: Self-pay | Admitting: Physician Assistant

## 2022-09-14 DIAGNOSIS — J069 Acute upper respiratory infection, unspecified: Secondary | ICD-10-CM | POA: Insufficient documentation

## 2022-09-14 DIAGNOSIS — Z1152 Encounter for screening for COVID-19: Secondary | ICD-10-CM | POA: Insufficient documentation

## 2022-09-14 MED ORDER — PROMETHAZINE-DM 6.25-15 MG/5ML PO SYRP: 5.0000 mL | ORAL_SOLUTION | Freq: Four times a day (QID) | ORAL | 0 refills | Status: DC | PRN

## 2022-09-14 NOTE — ED Provider Notes (Signed)
[name]    CSN: [id number] Arrival date & time: 09/14/22  1821      History   Chief Complaint Chief Complaint  Patient presents with   Otalgia        Cough   Nasal Congestion    HPI Tara Bell is a 56 y.o. female.   Patient here today for evaluation of nasal congestion, cough, sore throat, and ear pain that started 3 days ago. She has not had fever. She denies any vomiting or diarrhea. She has tried mucinex cold and flu without resolution of symptoms.   The history is provided by the patient.    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    Lymphedema of arm    DUE TO LEFT   AXILLARY NODE DISSECTION   AND RADIATION   Migraines    S/P cardiac catheterization, with normal coronary arteries and normal EF, 07/01/13 07/13/2013    Patient Active Problem List   Diagnosis Date Noted   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   Family history of colon cancer    Family history of prostate cancer    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   Lymphedema of arm 07/28/2016   Personal history of malignant neoplasm of breast 07/28/2016   Genetic testing 07/19/2015   Dense breast tissue 07/03/2015   Elevated CA-125 07/03/2015   Iron deficiency anemia due to chronic blood loss 07/03/2015   Screening mammogram for high-risk patient 07/03/2015   History of antineoplastic chemotherapy 10/31/2014   Palpitations 04/19/2014   Easy bruising 04/19/2014   Fibroid uterus 01/26/2014   Elevated troponin I level, mildly secondary to HTN 07/13/2013   S/P cardiac  catheterization, with normal coronary arteries and normal EF, 07/01/13 07/13/2013   Hyperlipidemia LDL goal < 100, normal coronary arteries. 07/13/2013   Chest pain 06/30/2013   Fibroids, intramural 10/24/2012   BRCA1 negative    BRCA2 indeterminant marker    Hypertension    History of left breast cancer 07/06/2001    Past Surgical History:  Procedure Laterality Date   AUGMENTATION MAMMAPLASTY  2008   BILATERAL TUBAL     BREAST BIOPSY     BREAST EXCISIONAL BIOPSY Right 10/2019   ADH   BREAST LUMPECTOMY Left 2002   AE 35   BREAST LUMPECTOMY WITH RADIOACTIVE SEED LOCALIZATION Right 10/06/2019   Procedure: RIGHT BREAST LUMPECTOMY WITH RADIOACTIVE SEED LOCALIZATION;  Surgeon: Fanny Skates, MD;  Location: [facility];  Service: General;  Laterality: Right;   CARDIAC CATHETERIZATION  07/01/13   Angiographically normal coronary arteries with normal left atrial function   HYSTEROSCOPY  12/10/07   RESECTOSCOPIC POLYPECTOMY/MYOMECTOMY   LEFT HEART CATHETERIZATION WITH CORONARY ANGIOGRAM N/A 07/01/2013   Procedure: LEFT HEART CATHETERIZATION WITH CORONARY ANGIOGRAM;  Surgeon: Leonie Man, MD;  Location: [facility] CATH LAB;  Service: Cardiovascular;  Laterality: N/A;   MASTECTOMY, PARTIAL  07/23/2001   LEFT BREAST WITH AXILLARY NODE EVALUATION   TUBAL LIGATION     BILATERAL    OB History     Gravida  2   Para  2   Term      Preterm      AB      Living  2      SAB      IAB      Ectopic      Multiple      Live Births               Home Medications    Prior to Admission medications   Medication Sig Start Date End Date Taking? Authorizing Provider  promethazine-dextromethorphan (PROMETHAZINE-DM) 6.25-15 MG/5ML syrup Take 5 mLs by mouth 4 (four) times daily as needed for cough. 09/14/22  Yes Francene Finders, PA-C  ibuprofen (ADVIL) 600 MG tablet TAKE 1 TABLET BY MOUTH EVERY 8 HOURS AS NEEDED 01/26/20   Huel Cote, NP  metoprolol tartrate (LOPRESSOR) 25 MG tablet Take  0.5 tablets (12.5 mg total) by mouth 2 (two) times daily. Patient taking differently: Take 12.5 mg by mouth daily. 04/19/14   Leonie Man, MD  TRIBENZOR 20-5-12.5 MG TABS Take 1 tablet by mouth daily.  06/27/15   [provider]  valACYclovir (VALTREX) 500 MG tablet Take 1 tablet (500 mg total) by mouth 2 (two) times daily as needed (fever blisters/cold sores.). For 3-5 days 01/08/21   Tamela Gammon, NP  zonisamide (ZONEGRAN) 25 MG capsule Take 25 mg by mouth 4 (four) times daily. 09/16/19 02/05/20  [provider]    Family History Family History  Problem Relation Age of Onset   Hypertension Mother    Heart disease Mother    Hypertension Father    Prostate cancer Father 62   Cancer Maternal Aunt        unknown type   Stomach cancer Paternal Uncle        d. 68   Leukemia Paternal Uncle    Hypertension Sister    Colon cancer Sister 63   Hypertension Sister     Social History Social History   Tobacco Use   Smoking status: Never   Smokeless tobacco: Never  Vaping Use   Vaping Use: Never used  Substance Use Topics   Alcohol use: Not Currently   Drug use: No     Allergies   Nitrofurantoin monohyd macro   Review of Systems Review of Systems  Constitutional:  Negative for chills and fever.  HENT:  Positive for congestion, ear pain and sore throat.   Eyes:  Negative for discharge and redness.  Respiratory:  Positive for cough. Negative for shortness of breath and wheezing.   Gastrointestinal:  Negative for abdominal pain, diarrhea, nausea and vomiting.     Physical Exam Triage Vital Signs ED Triage Vitals  Enc Vitals Group     BP      Pulse      Resp      Temp      Temp src      SpO2      Weight      Height      Head Circumference      Peak Flow      Pain Score      Pain Loc      Pain Edu?      Excl. in [place]?    No data found.  Updated Vital Signs BP 136/81   Pulse 77   Temp 98.1 F (36.7 C)   Resp 19   LMP 08/21/2017   SpO2  95%   Physical Exam Vitals and nursing note reviewed.  Constitutional:      General: She is not in acute distress.    Appearance: Normal appearance. She is not ill-appearing.  HENT:     Head: Normocephalic and atraumatic.     Right Ear: Tympanic membrane normal.     Left Ear: Tympanic membrane normal.     Nose: Congestion present.     Mouth/Throat:     Mouth: Mucous membranes are moist.     Pharynx: Posterior oropharyngeal erythema present. No oropharyngeal exudate.  Eyes:     Conjunctiva/sclera: Conjunctivae normal.  Cardiovascular:     Rate and Rhythm: Normal rate and regular rhythm.     Heart sounds: Normal heart sounds. No murmur heard. Pulmonary:     Effort: Pulmonary effort is normal. No respiratory distress.     Breath sounds: Normal breath sounds. No wheezing, rhonchi or rales.  Skin:    General: Skin is warm and dry.  Neurological:     Mental Status: She is alert.  Psychiatric:        Mood and Affect: Mood normal.        Thought Content: Thought content normal.      UC Treatments / Results  Labs (all labs ordered are listed, but only abnormal results are displayed) Labs Reviewed  RESP PANEL BY RT-PCR (RSV, FLU A&B, COVID)  [id number]    EKG   Radiology No results found.  Procedures Procedures (including critical care time)  Medications Ordered in UC Medications - No data to display  Initial Impression / Assessment and Plan / UC Course  I have reviewed the triage vital signs and the nursing notes.  Pertinent labs & imaging results that were available during my care of the patient were reviewed by me and considered in my medical decision making (see chart for details).    Suspect viral etiology of symptoms. Will screen for covid, flu and rsv. Recommend symptomatic treatment and cough syrup prescribed at patient's request. Encouraged follow up if no gradual improvement or with any concerns. Otherwise will await results for further recommendation.   Final  Clinical Impressions(s) / UC Diagnoses   Final diagnoses:  Acute upper respiratory infection  Encounter for screening for COVID-19   Discharge Instructions   None    ED Prescriptions     Medication Sig Dispense Auth. Provider   promethazine-dextromethorphan (PROMETHAZINE-DM) 6.25-15 MG/5ML syrup Take 5 mLs by mouth 4 (four) times daily as needed for cough. 118 mL Francene Finders, PA-C      PDMP not reviewed this encounter.   Francene Finders, PA-C 09/14/22 Einar Crow

## 2022-09-14 NOTE — ED Triage Notes (Signed)
Pt presents to uc with co of cough, otalgia and sore throat for 3 days. Pt reports pain in ear is worse on inspiration. Pt has been taking musinex cold and flu.

## 2022-09-15 LAB — RESP PANEL BY RT-PCR (RSV, FLU A&B, COVID)  RVPGX2
Influenza A by PCR: NEGATIVE
Influenza B by PCR: NEGATIVE
Resp Syncytial Virus by PCR: NEGATIVE
SARS Coronavirus 2 by RT PCR: NEGATIVE

## 2022-10-01 ENCOUNTER — Other Ambulatory Visit: Payer: Managed Care, Other (non HMO)

## 2022-10-27 ENCOUNTER — Other Ambulatory Visit: Payer: Managed Care, Other (non HMO)

## 2022-11-16 ENCOUNTER — Ambulatory Visit
Admission: RE | Admit: 2022-11-16 | Discharge: 2022-11-16 | Disposition: A | Discharge status: Home / Self Care | Payer: Managed Care, Other (non HMO) | Source: Ambulatory Visit | Attending: Hematology | Admitting: Hematology

## 2022-11-16 DIAGNOSIS — Z853 Personal history of malignant neoplasm of breast: Secondary | ICD-10-CM

## 2022-11-16 MED ORDER — GADOPICLENOL 0.5 MMOL/ML IV SOLN
7.5000 mL | Freq: Once | INTRAVENOUS | Status: AC | PRN
  Administered 2022-11-16: 7.5 mL via INTRAVENOUS

## 2022-11-20 ENCOUNTER — Encounter: Payer: Self-pay | Admitting: Hematology

## 2023-03-19 ENCOUNTER — Other Ambulatory Visit: Payer: Self-pay | Admitting: Obstetrics and Gynecology

## 2023-03-19 DIAGNOSIS — Z0000 Encounter for general adult medical examination without abnormal findings: Secondary | ICD-10-CM

## 2023-05-17 ENCOUNTER — Ambulatory Visit: Payer: Managed Care, Other (non HMO)

## 2023-05-24 ENCOUNTER — Ambulatory Visit
Admission: RE | Admit: 2023-05-24 | Discharge: 2023-05-24 | Disposition: A | Discharge status: Home / Self Care | Payer: Managed Care, Other (non HMO) | Source: Ambulatory Visit | Attending: Obstetrics and Gynecology | Admitting: Obstetrics and Gynecology

## 2023-05-24 DIAGNOSIS — Z0000 Encounter for general adult medical examination without abnormal findings: Secondary | ICD-10-CM

## 2023-06-10 ENCOUNTER — Other Ambulatory Visit: Payer: Self-pay | Admitting: Obstetrics and Gynecology

## 2023-06-10 DIAGNOSIS — Z853 Personal history of malignant neoplasm of breast: Secondary | ICD-10-CM

## 2023-06-11 ENCOUNTER — Telehealth: Payer: Self-pay

## 2023-06-11 ENCOUNTER — Other Ambulatory Visit: Payer: Self-pay

## 2023-06-11 NOTE — Telephone Encounter (Signed)
Spoke w/pt via previous telephone message from pt today.  Informed pt that Dr. Mosetta Putt has responded to the pt's questions and said "Yes, it's OK to have the MRI done w/in 7 months from the date of the mammogram."  Pt was thankful for a return call and had no further questions or concerns at this time.

## 2023-06-11 NOTE — Telephone Encounter (Signed)
Pt called regarding mammogram and MRI of breast scheduled.  Pt is a former pt that has been d/c from your services d/t high risk period for recurrence has completed.  Pt care is now managed by her PCP & GYN regarding mammograms and breast MRI.  Pt stated her PCP ordered her mammogram which was completed on 05/24/2023.  PCP ordered a MRI for February 2025.  Pt is concern because she thought Dr. Mosetta Putt wanted her MRIs to be done 6 months from the date of her mammograms.  Pt wants to know if it's OK for her MRI to be 7 months from the completion of her mammogram.  Informed pt that this nurse would notify of her call and question.

## 2023-10-17 ENCOUNTER — Emergency Department (HOSPITAL_BASED_OUTPATIENT_CLINIC_OR_DEPARTMENT_OTHER)
Admission: EM | Admit: 2023-10-17 | Discharge: 2023-10-17 | Disposition: A | Discharge status: Home / Self Care | Payer: Managed Care, Other (non HMO) | Attending: Emergency Medicine | Admitting: Emergency Medicine

## 2023-10-17 ENCOUNTER — Other Ambulatory Visit: Payer: Self-pay

## 2023-10-17 ENCOUNTER — Emergency Department (HOSPITAL_BASED_OUTPATIENT_CLINIC_OR_DEPARTMENT_OTHER): Payer: Managed Care, Other (non HMO)

## 2023-10-17 ENCOUNTER — Ambulatory Visit
Admission: EM | Admit: 2023-10-17 | Discharge: 2023-10-17 | Disposition: A | Discharge status: Home / Self Care | Payer: Managed Care, Other (non HMO)

## 2023-10-17 ENCOUNTER — Encounter (HOSPITAL_BASED_OUTPATIENT_CLINIC_OR_DEPARTMENT_OTHER): Payer: Self-pay

## 2023-10-17 DIAGNOSIS — G43809 Other migraine, not intractable, without status migrainosus: Secondary | ICD-10-CM | POA: Diagnosis not present

## 2023-10-17 DIAGNOSIS — I10 Essential (primary) hypertension: Secondary | ICD-10-CM | POA: Insufficient documentation

## 2023-10-17 DIAGNOSIS — R519 Headache, unspecified: Secondary | ICD-10-CM

## 2023-10-17 DIAGNOSIS — Z853 Personal history of malignant neoplasm of breast: Secondary | ICD-10-CM | POA: Diagnosis not present

## 2023-10-17 DIAGNOSIS — Z79899 Other long term (current) drug therapy: Secondary | ICD-10-CM | POA: Insufficient documentation

## 2023-10-17 LAB — CBC WITH DIFFERENTIAL/PLATELET
Abs Immature Granulocytes: 0.03 10*3/uL (ref 0.00–0.07)
Basophils Absolute: 0 10*3/uL (ref 0.0–0.1)
Basophils Relative: 0 %
Eosinophils Absolute: 0.1 10*3/uL (ref 0.0–0.5)
Eosinophils Relative: 1 %
HCT: 42.2 % (ref 36.0–46.0)
Hemoglobin: 13.9 g/dL (ref 12.0–15.0)
Immature Granulocytes: 0 %
Lymphocytes Relative: 23 %
Lymphs Abs: 1.9 10*3/uL (ref 0.7–4.0)
MCH: 29 pg (ref 26.0–34.0)
MCHC: 32.9 g/dL (ref 30.0–36.0)
MCV: 87.9 fL (ref 80.0–100.0)
Monocytes Absolute: 0.5 10*3/uL (ref 0.1–1.0)
Monocytes Relative: 6 %
Neutro Abs: 5.8 10*3/uL (ref 1.7–7.7)
Neutrophils Relative %: 70 %
Platelets: 252 10*3/uL (ref 150–400)
RBC: 4.8 MIL/uL (ref 3.87–5.11)
RDW: 14.8 % (ref 11.5–15.5)
WBC: 8.3 10*3/uL (ref 4.0–10.5)
nRBC: 0 % (ref 0.0–0.2)

## 2023-10-17 LAB — BASIC METABOLIC PANEL
Anion gap: 8 (ref 5–15)
BUN: 14 mg/dL (ref 6–20)
CO2: 29 mmol/L (ref 22–32)
Calcium: 10 mg/dL (ref 8.9–10.3)
Chloride: 102 mmol/L (ref 98–111)
Creatinine, Ser: 0.74 mg/dL (ref 0.44–1.00)
GFR, Estimated: >60 mL/min (ref >60)
Glucose, Bld: 96 mg/dL (ref 70–99)
Potassium: 3.6 mmol/L (ref 3.5–5.1)
Sodium: 139 mmol/L (ref 135–145)

## 2023-10-17 MED ORDER — METOCLOPRAMIDE HCL 10 MG PO TABS: 10.0000 mg | ORAL_TABLET | Freq: Four times a day (QID) | ORAL | 0 refills | Status: AC

## 2023-10-17 MED ORDER — METOCLOPRAMIDE HCL 5 MG/ML IJ SOLN
10.0000 mg | Freq: Once | INTRAMUSCULAR | Status: AC
  Administered 2023-10-17: 10 mg via INTRAVENOUS
  Filled 2023-10-17: qty 2

## 2023-10-17 MED ORDER — DIPHENHYDRAMINE HCL 50 MG/ML IJ SOLN
12.5000 mg | Freq: Once | INTRAMUSCULAR | Status: AC
  Administered 2023-10-17: 12.5 mg via INTRAVENOUS
  Filled 2023-10-17: qty 1

## 2023-10-17 MED ORDER — KETOROLAC TROMETHAMINE 15 MG/ML IJ SOLN
15.0000 mg | Freq: Once | INTRAMUSCULAR | Status: AC
  Administered 2023-10-17: 15 mg via INTRAVENOUS
  Filled 2023-10-17: qty 1

## 2023-10-17 NOTE — ED Triage Notes (Signed)
Headache onset 1130 this am.  Hx migraines.  Pain to top of head radiates to back of head and to right side head.  Seen in UC and sent here for CT scan.  States not light sensative

## 2023-10-17 NOTE — ED Notes (Signed)
Patient is being discharged from the Urgent Care and sent to the Emergency Department via private vehicle . Per R. Izola Price PA, patient is in need of higher level of care due to headache. Patient is aware and verbalizes understanding of plan of care.  Vitals:   10/17/23 1412  BP: (!) 144/84  Pulse: 71  Resp: 16  Temp: 98.1 F (36.7 C)  SpO2: 98%

## 2023-10-17 NOTE — ED Provider Notes (Signed)
Patient here today for evaluation of severe right-sided headache.  She reports the pain is radiated from her right parietal region to her right ear. She notes pain was a 10/ 10 and the worst of her life but has improved somewhat to a 7/10. Worse than her migraines per patient. Some associated nausea, no vision changes. Recommended further evaluation in the ED- EMS transport offered but patient prefers to transfer via POV.    Tomi Bamberger, PA-C 10/17/23 1446

## 2023-10-17 NOTE — ED Triage Notes (Addendum)
Pt states pain to the right side of her head worse with touch. Pt states the pain is also in her right ear and goes to her right jaw and right neck. States it started about 2 hours ago.  States she did not take anything for the pain. Pt also states she has urinated 3 times in the last hour.

## 2023-10-17 NOTE — Discharge Instructions (Signed)
You were seen for your headache in the emergency department.  You were given medicines which improved your symptoms.  At home, please take Tylenol and ibuprofen for your headache.  You may also take the Reglan we have prescribed you for your headache or any nausea or vomiting that you have.  You may take this with Benadryl for severe headaches but please note that this will make you drowsy.    Check your MyChart online for the results of any tests that had not resulted by the time you left the emergency department.   Follow-up with your primary doctor in 2-3 days regarding your visit.    Return immediately to the emergency department if you experience any of the following: scalp rash, vision changes, numbness or weakness of your arms or legs, or any other concerning symptoms.    Thank you for visiting our Emergency Department. It was a pleasure taking care of you today.

## 2023-10-17 NOTE — ED Provider Notes (Signed)
[facility] EM[facility]GENC[facility] DEPART[facility] AT [facility] Provider Note   CSN: [id number] Arrival date & time: 10/17/23  1457     History  Chief Complaint  Patient presents with   Headache    Tara Bell is a 57 y.o. female.  57 year old female with history of breast cancer in remission and hypertension who presents emergency department with headache.  Was at the grocery store at 1130 am when she had an abrupt onset headache that ran down the top right portion of her head.  Was sharp and radiated down towards her neck.  Says that afterwards she felt a tingling sensation on the right side of her head.  10/10 in severity.  Worst headache of her life.  Currently 5/10 in severity without any medications or interventions.  Went to urgent care was referred to the emergency department.  Denies photo or phonophobia.  No neck stiffness.  No vision changes.       Home Medications Prior to Admission medications   Medication Sig Start Date End Date Taking? Authorizing Provider  metoCLOPramide (REGLAN) 10 MG tablet Take 1 tablet (10 mg total) by mouth every 6 (six) hours. 10/17/23  Yes Rondel Baton, MD  ibuprofen (ADVIL) 600 MG tablet TAKE 1 TABLET BY MOUTH EVERY 8 HOURS AS NEEDED 01/26/20   Harrington Challenger, NP  metoprolol tartrate (LOPRESSOR) 25 MG tablet Take 0.5 tablets (12.5 mg total) by mouth 2 (two) times daily. Patient taking differently: Take 12.5 mg by mouth daily. 04/19/14   Marykay Lex, MD  promethazine-dextromethorphan (PROMETHAZINE-DM) 6.25-15 MG/5ML syrup Take 5 mLs by mouth 4 (four) times daily as needed for cough. 09/14/22   Tomi Bamberger, PA-C  TRIBENZOR 20-5-12.5 MG TABS Take 1 tablet by mouth daily.  06/27/15   [provider]  valACYclovir (VALTREX) 500 MG tablet Take 1 tablet (500 mg total) by mouth 2 (two) times daily as needed (fever blisters/cold sores.). For 3-5 days 01/08/21   Olivia Mackie, NP  zonisamide (ZONEGRAN) 25 MG capsule  Take 25 mg by mouth 4 (four) times daily. 09/16/19 02/05/20  [provider]      Allergies    Nitrofurantoin monohyd macro    Review of Systems   Review of Systems  Physical Exam Updated Vital Signs BP 110/71   Pulse 68   Temp 97.9 F (36.6 C) (Oral)   Resp 18   Ht 5' (1.524 m)   Wt 68 kg   LMP 08/21/2017   SpO2 99%   BMI 29.29 kg/m  Physical Exam Vitals and nursing note reviewed.  Constitutional:      General: She is not in acute distress.    Appearance: She is well-developed.  HENT:     Head: Normocephalic and atraumatic.     Right Ear: External ear normal.     Left Ear: External ear normal.     Nose: Nose normal.  Eyes:     Extraocular Movements: Extraocular movements intact.     Conjunctiva/sclera: Conjunctivae normal.     Pupils: Pupils are equal, round, and reactive to light.     Comments: No tenderness to palpation of either temporal region  Neck:     Comments: No meningismus Cardiovascular:     Rate and Rhythm: Normal rate and regular rhythm.  Musculoskeletal:     Cervical back: Normal range of motion and neck supple.     Right lower leg: No edema.     Left lower leg: No edema.  Skin:  General: Skin is warm and dry.     Comments: No overlying skin changes right scalp  Neurological:     Mental Status: She is alert and oriented to person, place, and time. Mental status is at baseline.     Comments: MENTAL STATUS: AAOx3 CRANIAL NERVES: II: Pupils equal and reactive 4 mm BL, no RAPD, no VF deficits III, IV, VI: EOM intact, no gaze preference or deviation, no nystagmus. V: normal sensation to light touch in V1, V2, and V3 segments bilaterally VII: no facial weakness or asymmetry, no nasolabial fold flattening VIII: normal hearing to speech and finger friction IX, X: normal palatal elevation, no uvular deviation XI: 5/5 head turn and 5/5 shoulder shrug bilaterally XII: midline tongue protrusion MOTOR: 5/5 strength in R shoulder flexion, elbow  flexion and extension, and grip strength. 5/5 strength in L shoulder flexion, elbow flexion and extension, and grip strength.  5/5 strength in R hip and knee flexion, knee extension, ankle plantar and dorsiflexion. 5/5 strength in L hip and knee flexion, knee extension, ankle plantar and dorsiflexion. SENSORY: Normal sensation to light touch in all extremities COORD: Normal finger to nose and heel to shin, no tremor, no dysmetria STATION: normal stance, no truncal ataxia  Psychiatric:        Mood and Affect: Mood normal.     ED Results / Procedures / Treatments   Labs (all labs ordered are listed, but only abnormal results are displayed) Labs Reviewed  CBC WITH DIFFERENTIAL/PLATELET  BASIC METABOLIC PANEL    EKG None  Radiology CT Head Wo Contrast Result Date: 10/17/2023 CLINICAL DATA:  Headache, thunderclap.  History of migraines. EXAM: CT HEAD WITHOUT CONTRAST TECHNIQUE: Contiguous axial images were obtained from the base of the skull through the vertex without intravenous contrast. RADIATION DOSE REDUCTION: This exam was performed according to the departmental dose-optimization program which includes automated exposure control, adjustment of the mA and/or kV according to patient size and/or use of iterative reconstruction technique. COMPARISON:  MRI brain 10/25/2007. FINDINGS: Brain: No acute intracranial hemorrhage. Gray-white differentiation is preserved. No hydrocephalus or extra-axial collection. No mass effect or midline shift. Vascular: No hyperdense vessel or unexpected calcification. Skull: No calvarial fracture or suspicious bone lesion. Skull base is unremarkable. Sinuses/Orbits: No acute finding. Other: None. IMPRESSION: No acute intracranial abnormality. Electronically Signed   By: Orvan Falconer M.D.   On: 10/17/2023 16:33    Procedures Procedures    Medications Ordered in ED Medications  metoCLOPramide (REGLAN) injection 10 mg (10 mg Intravenous Given 10/17/23 1553)   diphenhydrAMINE (BENADRYL) injection 12.5 mg (12.5 mg Intravenous Given 10/17/23 1554)  ketorolac (TORADOL) 15 MG/ML injection 15 mg (15 mg Intravenous Given 10/17/23 1554)    ED Course/ Medical Decision Making/ A&P                                 Medical Decision Making Amount and/or Complexity of Data Reviewed Labs: ordered. Radiology: ordered.  Risk Prescription drug management.   Tara Bell is a 57 y.o. female with comorbidities that complicate the patient evaluation including breast answer in remission and hypertension who presents to the emergency department with a headache  Initial Ddx:  Subarachnoid hemorrhage, migraine, ICH, shingles  MDM/Course:  Patient presents to the emergency department with abrupt onset headache that was very severe.  Says that the headache has spontaneously improved.  Has a nonfocal neurologic exam at this time.  No meningismus.  No rash at all on her head that would make me think that this is shingles.  She had a noncontrast CT scan that did not show evidence of subarachnoid hemorrhage.  Since she is within 6 hours feel that the chance of subarachnoid hemorrhage is highly unlikely.  Suspect that she may have a severe migraine.  Did counsel the patient to watch out for any skin lesions that would be concerning for shingles.  Will have her follow-up with her outpatient doctor.  Given a prescription of Reglan to take with Tylenol and ibuprofen for her headache.   This patient presents to the ED for concern of complaints listed in HPI, this involves an extensive number of treatment options, and is a complaint that carries with it a high risk of complications and morbidity. Disposition including potential need for admission considered.   Dispo: DC Home. Return precautions discussed including, but not limited to, those listed in the AVS. Allowed pt time to ask questions which were answered fully prior to dc.  Additional history obtained  from spouse Records reviewed Outpatient Clinic Notes The following labs were independently interpreted: Chemistry and show no acute abnormality I independently reviewed the following imaging with scope of interpretation limited to determining acute life threatening conditions related to emergency care: CT Head and agree with the radiologist interpretation with the following exceptions: none I personally reviewed and interpreted cardiac monitoring: normal sinus rhythm  I have reviewed the patients home medications and made adjustments as needed  Portions of this note were generated with Dragon dictation software. Dictation errors may occur despite best attempts at proofreading.     Final Clinical Impression(s) / ED Diagnoses Final diagnoses:  Other migraine without status migrainosus, not intractable    Rx / DC Orders ED Discharge Orders          Ordered    metoCLOPramide (REGLAN) 10 MG tablet  Every 6 hours        10/17/23 1657              Rondel Baton, MD 10/18/23 1545

## 2023-11-14 ENCOUNTER — Encounter: Payer: Self-pay | Admitting: Obstetrics and Gynecology

## 2023-11-22 ENCOUNTER — Ambulatory Visit
Admission: RE | Admit: 2023-11-22 | Discharge: 2023-11-22 | Disposition: A | Discharge status: Home / Self Care | Payer: Managed Care, Other (non HMO) | Source: Ambulatory Visit | Attending: Obstetrics and Gynecology | Admitting: Obstetrics and Gynecology

## 2023-11-22 DIAGNOSIS — Z853 Personal history of malignant neoplasm of breast: Secondary | ICD-10-CM

## 2023-11-22 MED ORDER — GADOPICLENOL 0.5 MMOL/ML IV SOLN
7.0000 mL | Freq: Once | INTRAVENOUS | Status: AC | PRN
  Administered 2023-11-22: 7 mL via INTRAVENOUS

## 2023-12-26 ENCOUNTER — Other Ambulatory Visit: Payer: Managed Care, Other (non HMO)

## 2024-05-26 ENCOUNTER — Encounter: Payer: Self-pay | Admitting: Emergency Medicine

## 2024-05-26 ENCOUNTER — Ambulatory Visit
Admission: EM | Admit: 2024-05-26 | Discharge: 2024-05-26 | Disposition: A | Discharge status: Home / Self Care | Attending: Emergency Medicine | Admitting: Emergency Medicine

## 2024-05-26 ENCOUNTER — Other Ambulatory Visit: Payer: Self-pay

## 2024-05-26 DIAGNOSIS — R051 Acute cough: Secondary | ICD-10-CM | POA: Diagnosis not present

## 2024-05-26 DIAGNOSIS — J019 Acute sinusitis, unspecified: Secondary | ICD-10-CM | POA: Diagnosis not present

## 2024-05-26 DIAGNOSIS — B9689 Other specified bacterial agents as the cause of diseases classified elsewhere: Secondary | ICD-10-CM | POA: Diagnosis not present

## 2024-05-26 MED ORDER — AMOXICILLIN-POT CLAVULANATE 875-125 MG PO TABS: 1.0000 | ORAL_TABLET | Freq: Two times a day (BID) | ORAL | 0 refills | Status: AC

## 2024-05-26 MED ORDER — PROMETHAZINE-DM 6.25-15 MG/5ML PO SYRP: 5.0000 mL | ORAL_SOLUTION | Freq: Four times a day (QID) | ORAL | 0 refills | Status: AC | PRN

## 2024-05-26 NOTE — ED Triage Notes (Signed)
 Pt here for cough and congestion with some ear pain and facial pain x 10 days

## 2024-05-26 NOTE — ED Provider Notes (Signed)
 [name]    CSN: [id number] Arrival date & time: 05/26/24  0946     History   Chief Complaint Chief Complaint  Patient presents with   Cough    HPI Tara Bell is a 58 y.o. female.  Here with 10 day history of nasal congestion, facial pain, sinus pressure, ear pain, and dry cough. No fever or chills. Not having shortness of breath or wheezing  No known sick contacts. 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    Lymphedema of arm    DUE TO LEFT   AXILLARY NODE DISSECTION   AND RADIATION   Migraines    S/P cardiac catheterization, with normal coronary arteries and normal EF, 07/01/13 07/13/2013    Patient Active Problem List   Diagnosis Date Noted   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   Family history of colon cancer    Family history of prostate cancer    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   Lymphedema of arm 07/28/2016   Personal history of malignant neoplasm of breast 07/28/2016   Genetic testing 07/19/2015   Dense breast tissue 07/03/2015   Elevated CA-125 07/03/2015   Iron deficiency anemia due to chronic blood loss 07/03/2015   Encounter for screening mammogram for high-risk patient 07/03/2015   History of antineoplastic chemotherapy 10/31/2014   Palpitations 04/19/2014   Easy bruising 04/19/2014   Fibroid uterus 01/26/2014   Elevated troponin I level, mildly secondary to HTN 07/13/2013   S/P cardiac catheterization, with normal coronary arteries and normal EF, 07/01/13 07/13/2013    Hyperlipidemia with target low density lipoprotein (LDL) cholesterol less than 100 mg/dL 90/91/7985   Chest pain 06/30/2013   Fibroids, intramural 10/24/2012   BRCA1 negative    BRCA2 indeterminant marker    Hypertension    History of left breast cancer 07/06/2001    Past Surgical History:  Procedure Laterality Date   AUGMENTATION MAMMAPLASTY  2008   BILATERAL TUBAL     BREAST BIOPSY     BREAST EXCISIONAL BIOPSY Right 10/2019   ADH   BREAST LUMPECTOMY Left 2002   AE 35   BREAST LUMPECTOMY WITH RADIOACTIVE SEED LOCALIZATION Right 10/06/2019   Procedure: RIGHT BREAST LUMPECTOMY WITH RADIOACTIVE SEED LOCALIZATION;  Surgeon: Gail Favorite, MD;  Location: [facility] OR;  Service: General;  Laterality: Right;   CARDIAC CATHETERIZATION  07/01/13   Angiographically normal coronary arteries with normal left atrial function   HYSTEROSCOPY  12/10/07   RESECTOSCOPIC POLYPECTOMY/MYOMECTOMY   LEFT HEART CATHETERIZATION WITH CORONARY ANGIOGRAM N/A 07/01/2013   Procedure: LEFT HEART CATHETERIZATION WITH CORONARY ANGIOGRAM;  Surgeon: Alm LELON Clay, MD;  Location: [facility] CATH LAB;  Service: Cardiovascular;  Laterality: N/A;   MASTECTOMY, PARTIAL  07/23/2001   LEFT BREAST WITH AXILLARY NODE EVALUATION   TUBAL LIGATION     BILATERAL    OB History     Gravida  2   Para  2   Term      Preterm      AB  Living  2      SAB      IAB      Ectopic      Multiple      Live Births               Home Medications    Prior to Admission medications   Medication Sig Start Date End Date Taking? Authorizing Provider  amoxicillin -clavulanate (AUGMENTIN ) 875-125 MG tablet Take 1 tablet by mouth every 12 (twelve) hours for 7 days. 05/26/24 06/02/24 Yes Olajuwon Fosdick, Asberry, PA-C  promethazine -dextromethorphan (PROMETHAZINE -DM) 6.25-15 MG/5ML syrup Take 5 mLs by mouth 4 (four) times daily as needed for cough. 05/26/24  Yes Johnye Kist, Asberry, PA-C  ibuprofen  (ADVIL ) 600 MG tablet TAKE 1 TABLET BY MOUTH  EVERY 8 HOURS AS NEEDED 01/26/20   Neysa Inocente PARAS, NP  metoCLOPramide  (REGLAN ) 10 MG tablet Take 1 tablet (10 mg total) by mouth every 6 (six) hours. 10/17/23   Yolande Lamar BROCKS, MD  metoprolol  tartrate (LOPRESSOR ) 25 MG tablet Take 0.5 tablets (12.5 mg total) by mouth 2 (two) times daily. Patient taking differently: Take 12.5 mg by mouth daily. 04/19/14   Anner Alm ORN, MD  TRIBENZOR 20-5-12.5 MG TABS Take 1 tablet by mouth daily.  06/27/15   [provider]  valACYclovir  (VALTREX ) 500 MG tablet Take 1 tablet (500 mg total) by mouth 2 (two) times daily as needed (fever blisters/cold sores.). For 3-5 days 01/08/21   Prentiss Annabella LABOR, NP  zonisamide (ZONEGRAN) 25 MG capsule Take 25 mg by mouth 4 (four) times daily. 09/16/19 02/05/20  [provider]    Family History Family History  Problem Relation Age of Onset   Hypertension Mother    Heart disease Mother    Hypertension Father    Prostate cancer Father 46   Cancer Maternal Aunt        unknown type   Stomach cancer Paternal Uncle        d. 59   Leukemia Paternal Uncle    Hypertension Sister    Colon cancer Sister 15   Hypertension Sister     Social History Social History   Tobacco Use   Smoking status: Never   Smokeless tobacco: Never  Vaping Use   Vaping status: Never Used  Substance Use Topics   Alcohol use: Not Currently   Drug use: No     Allergies   Nitrofurantoin monohyd macro   Review of Systems Review of Systems As per HPI   Physical Exam Triage Vital Signs ED Triage Vitals  Encounter Vitals Group     BP 05/26/24 1002 136/76     Girls Systolic BP Percentile --      Girls Diastolic BP Percentile --      Boys Systolic BP Percentile --      Boys Diastolic BP Percentile --      Pulse Rate 05/26/24 1002 99     Resp 05/26/24 1002 18     Temp 05/26/24 1002 98.1 F (36.7 C)     Temp Source 05/26/24 1002 Oral     SpO2 05/26/24 1002 96 %     Weight --      Height --      Head  Circumference --      Peak Flow --      Pain Score 05/26/24 1003 3     Pain Loc --      Pain Education --      Exclude from Growth Chart --  No data found.  Updated Vital Signs BP 136/76 (BP Location: Left Arm)   Pulse 99   Temp 98.1 F (36.7 C) (Oral)   Resp 18   LMP 08/21/2017   SpO2 96%    Physical Exam Vitals and nursing note reviewed.  Constitutional:      Appearance: She is not ill-appearing.  HENT:     Right Ear: Tympanic membrane and ear canal normal.     Left Ear: Tympanic membrane and ear canal normal.     Nose: Congestion present.     Left Sinus: Maxillary sinus tenderness and frontal sinus tenderness present.     Mouth/Throat:     Mouth: Mucous membranes are moist.     Pharynx: Oropharynx is clear. No posterior oropharyngeal erythema.  Eyes:     Conjunctiva/sclera: Conjunctivae normal.  Cardiovascular:     Rate and Rhythm: Normal rate and regular rhythm.     Pulses: Normal pulses.     Heart sounds: Normal heart sounds.  Pulmonary:     Effort: Pulmonary effort is normal. No respiratory distress.     Breath sounds: Normal breath sounds. No wheezing, rhonchi or rales.  Musculoskeletal:     Cervical back: Normal range of motion.  Lymphadenopathy:     Cervical: No cervical adenopathy.  Skin:    General: Skin is warm and dry.  Neurological:     Mental Status: She is alert and oriented to person, place, and time.     UC Treatments / Results  Labs (all labs ordered are listed, but only abnormal results are displayed) Labs Reviewed - No data to display  EKG  Radiology No results found.  Procedures Procedures (including critical care time)  Medications Ordered in UC Medications - No data to display  Initial Impression / Assessment and Plan / UC Course  I have reviewed the triage vital signs and the nursing notes.  Pertinent labs & imaging results that were available during my care of the patient were reviewed by me and considered in my medical  decision making (see chart for details).  Afebrile, well appearing, clear lungs 10 day history of nasal congestion and facial pain, treat for acute bacterial sinusitis.  Augmentin  twice daily for 7 days.  Also sent Promethazine  DM to use for cough.  Advised reasons to return to clinic.  Patient is agreeable with plan, no questions Note for work is provided  Final Clinical Impressions(s) / UC Diagnoses   Final diagnoses:  Acute bacterial sinusitis  Acute cough     Discharge Instructions      Augmentin  -- twice daily for 7 days in a row. Take with food to avoid upset stomach. Finish the full course!  It might take 3 to 4 days for the medicine to start working.  The promethazine  DM cough syrup can be used up to 4 times daily. If this medication makes you drowsy, take only once before bed.  Please return if needed, especially if symptoms are not improving after about 4 days     ED Prescriptions     Medication Sig Dispense Auth. Provider   promethazine -dextromethorphan (PROMETHAZINE -DM) 6.25-15 MG/5ML syrup Take 5 mLs by mouth 4 (four) times daily as needed for cough. 240 mL Freyja Govea, PA-C   amoxicillin -clavulanate (AUGMENTIN ) 875-125 MG tablet Take 1 tablet by mouth every 12 (twelve) hours for 7 days. 14 tablet Umer Harig, Asberry, PA-C      PDMP not reviewed this encounter.   Jeryl Asberry, PA-C 05/26/24 1116

## 2024-05-26 NOTE — Discharge Instructions (Addendum)
 Augmentin  -- twice daily for 7 days in a row. Take with food to avoid upset stomach. Finish the full course!  It might take 3 to 4 days for the medicine to start working.  The promethazine  DM cough syrup can be used up to 4 times daily. If this medication makes you drowsy, take only once before bed.  Please return if needed, especially if symptoms are not improving after about 4 days

## 2024-06-24 ENCOUNTER — Other Ambulatory Visit: Payer: Self-pay | Admitting: Obstetrics and Gynecology

## 2024-06-24 DIAGNOSIS — Z1231 Encounter for screening mammogram for malignant neoplasm of breast: Secondary | ICD-10-CM

## 2024-07-07 ENCOUNTER — Other Ambulatory Visit: Payer: Self-pay | Admitting: Medical Genetics

## 2024-07-10 ENCOUNTER — Ambulatory Visit
Admission: RE | Admit: 2024-07-10 | Discharge: 2024-07-10 | Disposition: A | Discharge status: Home / Self Care | Source: Ambulatory Visit | Attending: Obstetrics and Gynecology | Admitting: Obstetrics and Gynecology

## 2024-07-10 DIAGNOSIS — Z1231 Encounter for screening mammogram for malignant neoplasm of breast: Secondary | ICD-10-CM

## 2024-09-18 ENCOUNTER — Other Ambulatory Visit: Payer: Self-pay | Admitting: Nurse Practitioner

## 2024-09-18 DIAGNOSIS — Z9189 Other specified personal risk factors, not elsewhere classified: Secondary | ICD-10-CM

## 2024-09-18 DIAGNOSIS — Z1501 Genetic susceptibility to malignant neoplasm of breast: Secondary | ICD-10-CM

## 2024-09-18 DIAGNOSIS — Z853 Personal history of malignant neoplasm of breast: Secondary | ICD-10-CM

## 2024-09-18 DIAGNOSIS — Z1239 Encounter for other screening for malignant neoplasm of breast: Secondary | ICD-10-CM

## 2024-11-22 ENCOUNTER — Other Ambulatory Visit
